# Patient Record
Sex: Female | Born: 1970 | Race: Black or African American | Hispanic: No | Marital: Single | Smoking: Never smoker
Health system: Southern US, Community
[De-identification: ages and names within clinical notes are randomized; demographics above are authoritative.]

## PROBLEM LIST (undated history)

## (undated) DIAGNOSIS — I471 Supraventricular tachycardia, unspecified: Secondary | ICD-10-CM

## (undated) DIAGNOSIS — R011 Cardiac murmur, unspecified: Secondary | ICD-10-CM

## (undated) DIAGNOSIS — R000 Tachycardia, unspecified: Secondary | ICD-10-CM

## (undated) DIAGNOSIS — I82409 Acute embolism and thrombosis of unspecified deep veins of unspecified lower extremity: Secondary | ICD-10-CM

## (undated) DIAGNOSIS — I2699 Other pulmonary embolism without acute cor pulmonale: Secondary | ICD-10-CM

## (undated) DIAGNOSIS — C50911 Malignant neoplasm of unspecified site of right female breast: Secondary | ICD-10-CM

## (undated) HISTORY — PX: MOUTH SURGERY: SHX715

---

## 2001-11-25 ENCOUNTER — Other Ambulatory Visit: Admission: RE | Admit: 2001-11-25 | Discharge: 2001-11-25 | Payer: Self-pay | Admitting: Obstetrics and Gynecology

## 2005-10-18 ENCOUNTER — Encounter: Admission: RE | Admit: 2005-10-18 | Discharge: 2005-10-18 | Payer: Self-pay | Admitting: Sports Medicine

## 2006-05-30 ENCOUNTER — Encounter: Admission: RE | Admit: 2006-05-30 | Discharge: 2006-05-30 | Payer: Self-pay | Admitting: Obstetrics and Gynecology

## 2006-06-13 ENCOUNTER — Encounter: Admission: RE | Admit: 2006-06-13 | Discharge: 2006-06-13 | Payer: Self-pay | Admitting: Obstetrics and Gynecology

## 2013-06-04 ENCOUNTER — Other Ambulatory Visit: Payer: Self-pay

## 2013-06-04 DIAGNOSIS — Z1231 Encounter for screening mammogram for malignant neoplasm of breast: Secondary | ICD-10-CM

## 2013-06-04 DIAGNOSIS — Z803 Family history of malignant neoplasm of breast: Secondary | ICD-10-CM

## 2013-06-24 ENCOUNTER — Ambulatory Visit: Payer: Self-pay

## 2013-06-25 ENCOUNTER — Ambulatory Visit
Admission: RE | Admit: 2013-06-25 | Discharge: 2013-06-25 | Disposition: A | Discharge status: Home / Self Care | Payer: 59 | Source: Ambulatory Visit

## 2013-06-25 DIAGNOSIS — Z1231 Encounter for screening mammogram for malignant neoplasm of breast: Secondary | ICD-10-CM

## 2013-06-25 DIAGNOSIS — Z803 Family history of malignant neoplasm of breast: Secondary | ICD-10-CM

## 2013-06-29 ENCOUNTER — Other Ambulatory Visit: Payer: Self-pay | Admitting: Family Medicine

## 2013-06-29 DIAGNOSIS — R928 Other abnormal and inconclusive findings on diagnostic imaging of breast: Secondary | ICD-10-CM

## 2013-07-14 ENCOUNTER — Ambulatory Visit
Admission: RE | Admit: 2013-07-14 | Discharge: 2013-07-14 | Disposition: A | Discharge status: Home / Self Care | Payer: 59 | Source: Ambulatory Visit | Attending: Family Medicine | Admitting: Family Medicine

## 2013-07-14 DIAGNOSIS — R928 Other abnormal and inconclusive findings on diagnostic imaging of breast: Secondary | ICD-10-CM

## 2014-07-07 HISTORY — PX: BREAST BIOPSY: SHX20

## 2014-07-25 ENCOUNTER — Other Ambulatory Visit: Payer: Self-pay | Admitting: Family Medicine

## 2014-07-25 DIAGNOSIS — N63 Unspecified lump in unspecified breast: Secondary | ICD-10-CM

## 2014-08-03 ENCOUNTER — Encounter (INDEPENDENT_AMBULATORY_CARE_PROVIDER_SITE_OTHER): Payer: Self-pay

## 2014-08-03 ENCOUNTER — Ambulatory Visit
Admission: RE | Admit: 2014-08-03 | Discharge: 2014-08-03 | Disposition: A | Discharge status: Home / Self Care | Payer: 59 | Source: Ambulatory Visit | Attending: Family Medicine | Admitting: Family Medicine

## 2014-08-03 ENCOUNTER — Other Ambulatory Visit: Payer: Self-pay | Admitting: Family Medicine

## 2014-08-03 DIAGNOSIS — N63 Unspecified lump in unspecified breast: Secondary | ICD-10-CM

## 2014-08-03 DIAGNOSIS — N6310 Unspecified lump in the right breast, unspecified quadrant: Secondary | ICD-10-CM

## 2014-08-05 ENCOUNTER — Ambulatory Visit
Admission: RE | Admit: 2014-08-05 | Discharge: 2014-08-05 | Disposition: A | Discharge status: Home / Self Care | Payer: 59 | Source: Ambulatory Visit | Attending: Family Medicine | Admitting: Family Medicine

## 2014-08-05 ENCOUNTER — Other Ambulatory Visit: Payer: Self-pay | Admitting: Family Medicine

## 2014-08-05 DIAGNOSIS — N6310 Unspecified lump in the right breast, unspecified quadrant: Secondary | ICD-10-CM

## 2014-08-07 DIAGNOSIS — C50911 Malignant neoplasm of unspecified site of right female breast: Secondary | ICD-10-CM

## 2014-08-07 HISTORY — DX: Malignant neoplasm of unspecified site of right female breast: C50.911

## 2014-08-08 ENCOUNTER — Other Ambulatory Visit: Payer: Self-pay | Admitting: Family Medicine

## 2014-08-08 DIAGNOSIS — C50911 Malignant neoplasm of unspecified site of right female breast: Secondary | ICD-10-CM

## 2014-08-10 ENCOUNTER — Telehealth: Payer: Self-pay | Admitting: *Deleted

## 2014-08-10 DIAGNOSIS — C50411 Malignant neoplasm of upper-outer quadrant of right female breast: Secondary | ICD-10-CM | POA: Insufficient documentation

## 2014-08-10 NOTE — Telephone Encounter (Signed)
Received call back from patient.  Confirmed [facility] for 08/17/14 at 12N .  Instructions and contact information given.

## 2014-08-10 NOTE — Telephone Encounter (Signed)
Left message for a return phone call to schedule patient for [facility] 08/17/14.   Awaiting patient response.

## 2014-08-12 ENCOUNTER — Ambulatory Visit
Admission: RE | Admit: 2014-08-12 | Discharge: 2014-08-12 | Disposition: A | Discharge status: Home / Self Care | Payer: 59 | Source: Ambulatory Visit | Attending: Family Medicine | Admitting: Family Medicine

## 2014-08-12 DIAGNOSIS — C50911 Malignant neoplasm of unspecified site of right female breast: Secondary | ICD-10-CM

## 2014-08-12 MED ORDER — GADOBENATE DIMEGLUMINE 529 MG/ML IV SOLN
19.0000 mL | Freq: Once | INTRAVENOUS | Status: AC | PRN
  Administered 2014-08-12: 19 mL via INTRAVENOUS

## 2014-08-17 ENCOUNTER — Encounter: Payer: Self-pay | Admitting: Physical Therapy

## 2014-08-17 ENCOUNTER — Ambulatory Visit
Admission: RE | Admit: 2014-08-17 | Discharge: 2014-08-17 | Disposition: A | Discharge status: Home / Self Care | Payer: 59 | Source: Ambulatory Visit | Attending: Radiation Oncology | Admitting: Radiation Oncology

## 2014-08-17 ENCOUNTER — Ambulatory Visit (HOSPITAL_BASED_OUTPATIENT_CLINIC_OR_DEPARTMENT_OTHER): Payer: 59 | Admitting: Hematology and Oncology

## 2014-08-17 ENCOUNTER — Encounter: Payer: Self-pay | Admitting: Hematology and Oncology

## 2014-08-17 ENCOUNTER — Encounter (INDEPENDENT_AMBULATORY_CARE_PROVIDER_SITE_OTHER): Payer: Self-pay

## 2014-08-17 ENCOUNTER — Other Ambulatory Visit: Payer: Self-pay | Admitting: *Deleted

## 2014-08-17 ENCOUNTER — Ambulatory Visit (HOSPITAL_BASED_OUTPATIENT_CLINIC_OR_DEPARTMENT_OTHER): Payer: 59

## 2014-08-17 ENCOUNTER — Encounter: Payer: Self-pay | Admitting: Radiation Oncology

## 2014-08-17 ENCOUNTER — Ambulatory Visit: Payer: 59 | Attending: General Surgery | Admitting: Physical Therapy

## 2014-08-17 ENCOUNTER — Encounter: Payer: Self-pay | Admitting: General Practice

## 2014-08-17 ENCOUNTER — Other Ambulatory Visit (HOSPITAL_BASED_OUTPATIENT_CLINIC_OR_DEPARTMENT_OTHER): Payer: 59

## 2014-08-17 VITALS — BP 127/76 | HR 90 | Temp 98.3°F | Resp 20 | Ht 67.25 in | Wt 206.8 lb

## 2014-08-17 DIAGNOSIS — C50411 Malignant neoplasm of upper-outer quadrant of right female breast: Secondary | ICD-10-CM

## 2014-08-17 DIAGNOSIS — Z5189 Encounter for other specified aftercare: Secondary | ICD-10-CM | POA: Diagnosis not present

## 2014-08-17 DIAGNOSIS — Z171 Estrogen receptor negative status [ER-]: Secondary | ICD-10-CM

## 2014-08-17 DIAGNOSIS — C773 Secondary and unspecified malignant neoplasm of axilla and upper limb lymph nodes: Secondary | ICD-10-CM

## 2014-08-17 LAB — COMPREHENSIVE METABOLIC PANEL (CC13)
ALT: 30 U/L (ref 0–55)
AST: 31 U/L (ref 5–34)
Albumin: 3.9 g/dL (ref 3.5–5.0)
Alkaline Phosphatase: 84 U/L (ref 40–150)
Anion Gap: 10 mEq/L (ref 3–11)
BILIRUBIN TOTAL: 0.34 mg/dL (ref 0.20–1.20)
BUN: 8.9 mg/dL (ref 7.0–26.0)
CHLORIDE: 102 meq/L (ref 98–109)
CO2: 26 mEq/L (ref 22–29)
CREATININE: 0.8 mg/dL (ref 0.6–1.1)
Calcium: 10.5 mg/dL — ABNORMAL HIGH (ref 8.4–10.4)
GLUCOSE: 90 mg/dL (ref 70–140)
POTASSIUM: 3.9 meq/L (ref 3.5–5.1)
Sodium: 138 mEq/L (ref 136–145)
Total Protein: 8 g/dL (ref 6.4–8.3)

## 2014-08-17 LAB — CBC WITH DIFFERENTIAL/PLATELET
BASO%: 1 % (ref 0.0–2.0)
Basophils Absolute: 0.1 10*3/uL (ref 0.0–0.1)
EOS%: 0.7 % (ref 0.0–7.0)
Eosinophils Absolute: 0 10*3/uL (ref 0.0–0.5)
HCT: 38.6 % (ref 34.8–46.6)
HGB: 12.2 g/dL (ref 11.6–15.9)
LYMPH#: 1.4 10*3/uL (ref 0.9–3.3)
LYMPH%: 26.8 % (ref 14.0–49.7)
MCH: 26.7 pg (ref 25.1–34.0)
MCHC: 31.7 g/dL (ref 31.5–36.0)
MCV: 84.3 fL (ref 79.5–101.0)
MONO#: 0.5 10*3/uL (ref 0.1–0.9)
MONO%: 9.3 % (ref 0.0–14.0)
NEUT#: 3.4 10*3/uL (ref 1.5–6.5)
NEUT%: 62.2 % (ref 38.4–76.8)
Platelets: 344 10*3/uL (ref 145–400)
RBC: 4.58 10*6/uL (ref 3.70–5.45)
RDW: 14.9 % — AB (ref 11.2–14.5)
WBC: 5.4 10*3/uL (ref 3.9–10.3)

## 2014-08-17 NOTE — Progress Notes (Signed)
Cancer Center CONSULT NOTE  Patient Care Team: Maurice Small, MD as PCP - General (Family Medicine) Sabas Sous, MD as Consulting Physician (Hematology and Oncology) Glenna Fellows, MD as Consulting Physician (General Surgery) Lonie Peak, MD as Attending Physician (Radiation Oncology)  CHIEF COMPLAINTS/PURPOSE OF CONSULTATION:  Newly diagnosed breast cancer  HISTORY OF PRESENTING ILLNESS:  Jamie Edwards 43 y.o. female is here because of recent diagnosis of Right-sided breast cancer. Patient felt a palpable lump in the right breast and then subsequently underwent a breast mammogram that led to an ultrasound which revealed a 4.3 cm abnormality. She underwent an MRI of the breasts that revealed a 7.5 cm extent of the abnormality. 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 She was presented this morning in the multidisciplinary tumor board and she is here today to discuss the treatment plan. She said today accompanied by her fianc and her daughter and her sister.  I reviewed her records extensively and collaborated the history with the patient.  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  She was never exposed to fertility medications or hormone replacement therapy.  She has family history of Breast/GYN/GI cancer Her mother was diagnosed with breast cancer age 61 and she died soon after.  MEDICAL HISTORY:  Past Medical History  Diagnosis Date  . Breast cancer     SURGICAL HISTORY: History reviewed. No pertinent past surgical history.  SOCIAL HISTORY: History   Social History  . Marital Status: Single    Spouse Name: N/A    Number of Children: N/A  . Years of Education: N/A   Occupational History  . Not on file.   Social History Main Topics  . Smoking status: Never Smoker   . Smokeless tobacco: Not on file  . Alcohol Use: No  . Drug Use: No  . Sexual Activity: Not on file   Other Topics Concern  . Not on file   Social History Narrative    FAMILY HISTORY: History reviewed. No pertinent family history.  ALLERGIES:  is allergic to penicillins.  MEDICATIONS:  No current outpatient prescriptions on file.   No current facility-administered medications for this visit.    REVIEW OF SYSTEMS:   Constitutional: Denies fevers, chills or abnormal night sweats Eyes: Denies blurriness of vision, double vision or watery eyes Ears, nose, mouth, throat, and face: Denies mucositis or sore throat Respiratory: Denies cough, dyspnea or wheezes Cardiovascular: Denies palpitation, chest discomfort or lower extremity swelling Gastrointestinal:  Denies nausea, heartburn or change in bowel habits Skin: Denies abnormal skin rashes Lymphatics: Denies new lymphadenopathy or easy bruising Neurological:Denies numbness, tingling or new weaknesses Behavioral/Psych: Mood is stable, no new changes  Breast: palpable lump in the right breast All other systems were reviewed with the patient and are negative.  PHYSICAL EXAMINATION: ECOG PERFORMANCE STATUS: 1 - Symptomatic but completely ambulatory  Filed Vitals:   08/17/14 1311  BP: 127/76  Pulse: 90  Temp: 98.3  F (36.8 C)  Resp: 20   Filed Weights   08/17/14 1311  Weight: 206 lb 12.8 oz (93.804 kg)    GENERAL:alert, no distress and comfortable SKIN: skin color, texture, turgor are normal, no rashes or significant lesions EYES: normal, conjunctiva are pink and non-injected, sclera clear OROPHARYNX:no exudate, no erythema and lips, buccal mucosa, and tongue normal  NECK: supple, thyroid normal size, non-tender, without nodularity LYMPH:  no palpable lymphadenopathy in the cervical, axillary or inguinal LUNGS: clear to auscultation and percussion with normal breathing effort HEART: regular rate & rhythm and no murmurs and no lower extremity edema ABDOMEN:abdomen soft, non-tender and normal bowel sounds Musculoskeletal:no cyanosis of digits and no clubbing  PSYCH: alert & oriented x 3 with fluent speech NEURO: no focal motor/sensory deficits BREAST:right breast lump is measured to be around 6 cm and physical exam. No palpable axillary or supraclavicular lymphadenopathy  LABORATORY DATA:  I have reviewed the data as listed Lab Results  Component Value Date   WBC 5.4 08/17/2014   HGB 12.2 08/17/2014   HCT 38.6 08/17/2014   MCV 84.3 08/17/2014   PLT 344 08/17/2014   Lab Results  Component Value Date   NA 138 08/17/2014   K 3.9 08/17/2014   CO2 26 08/17/2014    RADIOGRAPHIC STUDIES: I have personally reviewed the radiological reports and agreed with the findings in the report. 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 After reviewing the pathology in detail, we proceeded to discuss the different treatment options between surgery, radiation, chemotherapy, antiestrogen therapies.  Recommendation: Neoadjuvant chemotherapy with dose dense Adriamycin and Cytoxan x4 followed by Taxol and carboplatin weekly x12 followed by surgery followed by radiation  Chemotherapy counseling:I have discussed the risks and benefits of chemotherapy including the risks of nausea/ vomiting, risk of infection from low WBC count, fatigue due to chemo or anemia, bruising or bleeding due to low platelets, mouth sores, loss/ change in taste and decreased appetite. Liver and kidney function will be monitored through out chemotherapy as abnormalities in liver and kidney function may be a side effect of treatment. Cardiac dysfunction due to Adriamycin was discussed in detail. I also discussed neuropathy was from Taxol and carboplatin Risk of permanent bone marrow dysfunction and leukemia due to chemo were also discussed.  Plan: In anticipation of neoadjuvant chemotherapy, I would like to request port placement, echocardiogram, chemotherapy class. The complete staging she will need a PET/CT scan.  PREVENT Study: patient was provided information on PREVENT study which as patient's getting Adriamycin chemotherapy to atorvastatin versus placebo. She would also be able to get 2 cardiac MRIs for free for being on the study. Patient will be contacted our clinical trials team to discuss her interest in participation in this trial.  Patient wants  to go to [facility] for next week and we will see her back once she gets these tests and procedures done to start chemotherapy. patient will also need genetic counseling and testing.  All questions were answered. The patient knows to call the clinic with any problems, questions or concerns. I spent 55 minutes counseling the patient face to face. The total time spent in the  appointment was 60 minutes and more than 50% was on counseling.     Rulon Eisenmenger, MD 08/17/2014 4:14 PM

## 2014-08-17 NOTE — Patient Instructions (Signed)
Patient was instructed today in a home exercise program today for post op shoulder range of motion. These included active assist shoulder flexion in sitting, scapular retraction, wall walking with shoulder abduction, and hands behind head external rotation.  She was encouraged to do these twice a day, holding 3 seconds and repeating 5 times when permitted by her physician.      Physical Therapy Information for After Breast Cancer Surgery/Treatment:   Lymphedema is a swelling condition that you may be at risk for in your arm if you have lymph nodes removed from the armpit area.  After a sentinel node biopsy, the risk is approximately 5-9% and is higher after an axillary node dissection.  There is treatment available for this condition and it is not life-threatening.  Contact your physician or physical therapist with concerns.  You may begin the 4 shoulder/posture exercises (see additional sheet) when permitted by your physician (typically a week after surgery).  If you have drains, you may need to wait until those are removed before beginning range of motion exercises.  A general recommendation is to not lift your arms above shoulder height until drains are removed.  These exercises should be done to your tolerance and gently.  This is not a "no pain/no gain" type of recovery so listen to your body and stretch into the range of motion that you can tolerate, stopping if you have pain.  If you are having immediate reconstruction, ask your plastic surgeon about doing exercises as he or she may want you to wait.  We encourage you to attend the free one time ABC (After Breast Cancer) class offered by [facility].  You will learn information related to lymphedema risk, prevention and treatment and additional exercises to regain mobility following surgery.  You can call [phone] for more information.  This is offered the 1st and 3rd Monday of each month.  You only attend the class  one time.  While undergoing any medical procedure or treatment, try to avoid blood pressure being taken or needle sticks from occurring on the arm on the side of cancer.   This recommendation begins after surgery and continues for the rest of your life.  This may help reduce your risk of getting lymphedema (swelling in your arm).  An excellent resource for those seeking information on lymphedema is the National Lymphedema Network's web site. It can be accessed at [place].org  If you notice swelling in your hand, arm or breast at any time following surgery (even if it is many years from now), please contact your doctor or physical therapist to discuss this.  Lymphedema can be treated at any time but it is easier for you if it is treated early on.  If you feel like your shoulder motion is not returning to normal in a reasonable amount of time, please contact your surgeon or physical therapist.  [facility] [place], [place], [place] [phone]; [address], [place], [place] [address] ABC CLASS After Breast Cancer Class  After Breast Cancer Class is a specially designed exercise class to assist you in a safe recover after having breast cancer surgery.  In this class you will learn how to get back to full function whether your drains were just removed or if you had surgery a month ago.  This one-time class is held the 1st and 3rd Monday of every month from 11:00 a.m. until 12:00 noon at the [facility] located at [address]  [place], [place] [address]  This class is FREE and space is limited. For more information or to register for the next available class, call [phone].  Class Goals   Understand specific stretches to improve the flexibility of you chest and shoulder.  Learn ways to safely strengthen your upper body and improve your posture.  Understand the warning signs of infection and why you may be at risk for an arm infection.  Learn about Lymphedema  and prevention.  ** You do not attend this class until after surgery.  Drains must be removed to participate

## 2014-08-17 NOTE — Progress Notes (Signed)
Visited with Jamie Edwards and her family (fiance, niece, close friend, and others) at breast clinic, introducing [facility] team/resources.  Did not receive distress screen from pt, but did review her stressors and support resources, providing pastoral presence, reflective listening, affirmation, and encouragement.  Per pt, she has good support from family/friends/church, appreciates the comprehensive info from clinic, and feels additional stress that her dx is more serious than she originally anticipated.  Family/friends indicated interest in attending events with her for support.  All are aware of ongoing chaplain and [facility] team availability, but please also page as needs arise:  [phone].  Thank you.  [place], [place]

## 2014-08-17 NOTE — Therapy (Signed)
Physical Therapy Evaluation  Patient Details  Name: Jamie Edwards MRN: [id number] Date of Birth: 1971-03-12  Encounter Date: 08/17/2014      PT End of Session - 08/17/14 1749    Visit Number 1   Number of Visits 1   PT Start Time 1408   PT Stop Time 1440   PT Time Calculation (min) 32 min   Activity Tolerance Patient tolerated treatment well   Behavior During Therapy [facility] for tasks assessed/performed      Past Medical History  Diagnosis Date  . Breast cancer     History reviewed. No pertinent past surgical history.  There were no vitals taken for this visit.  Visit Diagnosis:  Cancer of upper-outer quadrant of female breast, right      Subjective Assessment - 08/17/14 1740    Symptoms Patient was seen today at the breast multidisciplinary clinic for an assessment of her new right breast cancer.   Pertinent History She was diagnosed on 08/12/14 (MRI date) with Triple negative, axillary node positive right breast cancer with a Ki67 of 90%.     Patient Stated Goals Education about lymphedema risk reduction and post surgical home exercises.   Currently in Pain? No/denies          [facility] PT Assessment - 08/17/14 0001    Assessment   Medical Diagnosis right Triple negative breast cancer   Onset Date 08/12/14   Precautions   Precautions Other (comment)  active right breast cancer   Balance Screen   Has the patient fallen in the past 6 months No   Has the patient had a decrease in activity level because of a fear of falling?  No   Is the patient reluctant to leave their home because of a fear of falling?  No   Home Environment   Living Enviornment Private residence   Living Arrangements Spouse/significant other;Children  Lives with [place], 55 y.o. daughter, 8 mth old granddaughte   Available Help at Discharge Family   Prior Function   Level of [place] with basic ADLs   Vocation Full time Teacher, English as a foreign language   Vocation Requirements minor lifting, typing, presenting to groups   Leisure She does not exercise   Cognition   Overall Cognitive Status Within Functional Limits for tasks assessed   Posture/Postural Control   Posture/Postural Control Postural limitations   Postural Limitations Rounded Shoulders   AROM   Right Shoulder Extension 37 Degrees   Right Shoulder Flexion 116 Degrees   Right Shoulder ABduction 109 Degrees   Right Shoulder Internal Rotation 68 Degrees   Right Shoulder External Rotation 72 Degrees   Left Shoulder Extension 55 Degrees   Left Shoulder Flexion 134 Degrees   Left Shoulder ABduction 138 Degrees   Left Shoulder Internal Rotation 70 Degrees   Left Shoulder External Rotation 86 Degrees            PT Education - 08/17/14 1749    Education provided Yes   Education Details Post op shoulder range of motion home exercise program   Person(s) Educated Patient   Methods Explanation;Demonstration;Handout   Comprehension Returned demonstration;Verbalized understanding           Plan - 08/17/14 1750    Clinical Impression Statement Patient is planning to undergo neoadjuvant chemotherapy for Triple negative right breast cancer.  She will then likely undergo a right lumpectomy or mastectomy with an axillary node dissection followed by radiation as she has a known positive axillary node.  She has staging scans scheduled and will meet with a genetics counselor.  She will follow up with physical therapy as need throughout chemo and/or after surgery.   Pt will benefit from skilled therapeutic intervention in order to improve on the following deficits Impaired UE functional use;Increased edema;Decreased range of motion;Decreased endurance;Decreased knowledge of precautions;Decreased activity tolerance;Decreased scar mobility;Decreased strength  as needed during chemotherapy and after surgery.   Rehab Potential Excellent   Clinical Impairments Affecting Rehab Potential none    PT Frequency One time visit   PT Treatment/Interventions Therapeutic exercise;Patient/family education   Consulted and Agree with Plan of Care Patient        Problem List Patient Active Problem List   Diagnosis Date Noted  . Breast cancer of upper-outer quadrant of right female breast 08/10/2014         LYMPHEDEMA/ONCOLOGY QUESTIONNAIRE - 08/17/14 1746    Type   Cancer Type Right triple negative breast cancer   Lymphedema Assessments   Lymphedema Assessments Upper extremities   Right Upper Extremity Lymphedema   10 cm Proximal to Olecranon Process 35.9 cm   Olecranon Process 30 cm   10 cm Proximal to Ulnar Styloid Process 24.3 cm   Just Proximal to Ulnar Styloid Process 17.1 cm   Across Hand at [employer] 18.6 cm   At [place] of 2nd Digit 6.3 cm   Left Upper Extremity Lymphedema   10 cm Proximal to Olecranon Process 34.4 cm   Olecranon Process 28.8 cm   10 cm Proximal to Ulnar Styloid Process 23.4 cm   Just Proximal to Ulnar Styloid Process 17.8 cm   Across Hand at [employer] 18.5 cm   At [place] of 2nd Digit 6.2 cm            Breast Clinic Goals - 08/17/14 1754    Patient will be able to verbalize understanding of pertinent lymphedema risk reduction practices relevant to her diagnosis specifically related to skin care.   Time 1   Period Days   Status Achieved   Patient will be able to return demonstrate and/or verbalize understanding of the post-op home exercise program related to regaining shoulder range of motion.   Time 1   Period Days   Status Achieved   Patient will be able to verbalize understanding of the importance of attending the postoperative After Breast Cancer Class for further lymphedema risk reduction education and therapeutic exercise.   Time 1   Period Days   Status Achieved                SMITH,MARTI COOPER, PT   08/17/2014, 5:58 PM

## 2014-08-17 NOTE — Assessment & Plan Note (Signed)
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 After reviewing the pathology in detail, we proceeded to discuss the different treatment options between surgery, radiation, chemotherapy, antiestrogen therapies.  Recommendation: Neoadjuvant chemotherapy with dose dense Adriamycin and Cytoxan x4 followed by Taxol and carboplatin weekly x12 followed by surgery followed by radiation  Chemotherapy counseling:I have discussed the risks and benefits of chemotherapy including the risks of nausea/ vomiting, risk of infection from low WBC count, fatigue due to chemo or anemia, bruising or bleeding due to low platelets, mouth sores, loss/ change in taste and decreased appetite. Liver and kidney function will be monitored through out chemotherapy as abnormalities in liver and kidney function may be a side effect of treatment. Cardiac dysfunction due to Adriamycin was discussed in detail. I also discussed neuropathy was from Taxol and carboplatin Risk of permanent bone marrow dysfunction and leukemia due to chemo were also discussed.  Plan: In anticipation of neoadjuvant chemotherapy, I would like to request port placement, echocardiogram, chemotherapy class. The complete staging she will need a PET/CT scan.  PREVENT Study: patient was provided information on PREVENT study which as patient's getting Adriamycin chemotherapy to atorvastatin versus placebo. She would also be able to get 2 cardiac MRIs for free for being on the study. Patient will be contacted our clinical trials team to discuss her interest in participation in this  trial.  Patient wants to go to [facility] for next week and we will see her back once she gets these tests and procedures done to start chemotherapy.

## 2014-08-17 NOTE — Progress Notes (Signed)
Radiation Oncology         [phone] ________________________________  Initial outpatient Consultation  Name: Jamie Edwards MRN: [id number]  Date: 08/17/2014  DOB: 08-25-1971  WU:[id number] Theda Sers, MD  Excell Seltzer, MD   REFERRING PHYSICIAN: Excell Seltzer, MD  DIAGNOSIS:    ICD-9-CM ICD-10-CM   1. Breast cancer of upper-outer quadrant of right female breast 174.4 C50.411      Stage T3N1Mx, Stage III, Right Breast UOQ Invasive Ductal Carcinoma, ER- / PR- / Her2-, Ki6790%  HISTORY OF PRESENT ILLNESS::Jamie Edwards is a 43 y.o. female who presented with a palpable right breast mass, confirmed on mammogram.  On [place], this was 4.3cm in size. Abnormal axillary nodes appreciated on [place].  MRI - 7.5cm irregular spiculated mass appreciated with right axillary abnormal appearing nodes.  Biopsy of breast mass showed Intermediate to high grade IDC with DCIS;  Right axillary node +.  ER/PR neg, Her2 neg. Ki 67 90%  She denies unintentional weight loss, recurrent HAs, and pain.  PREVIOUS RADIATION THERAPY: No  PAST MEDICAL HISTORY:  has a past medical history of Breast cancer.    PAST SURGICAL HISTORY:History reviewed. No pertinent past surgical history.  FAMILY HISTORY: No cancers reported.  SOCIAL HISTORY:  reports that she has never smoked. She does not have any smokeless tobacco history on file. She reports that she does not drink alcohol or use illicit drugs. 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  ALLERGIES: Penicillins  MEDICATIONS:  No current outpatient prescriptions on file.   No current facility-administered medications for this encounter.    REVIEW OF SYSTEMS:  Notable for that above.   PHYSICAL EXAM:   Vitals with Age-Percentiles 08/17/2014  Length 213.0 cm  Systolic 865  Diastolic 76  Pulse 90  Respiration 20  Weight 93.804 kg  BMI 32.2  VISIT REPORT   General: Alert and oriented, in no acute distress HEENT: Head is  normocephalic.   Extraocular movements are intact. Oropharynx is clear. Neck: Neck is supple, no palpable cervical or supraclavicular lymphadenopathy. Heart: Regular in rate and rhythm with no murmurs, rubs, or gallops. Chest: Clear to auscultation bilaterally, with no rhonchi, wheezes, or rales. Abdomen: Soft, nontender, nondistended, with no rigidity or guarding. Extremities: No cyanosis or edema. Lymphatics: see neck Skin: No concerning lesions. Musculoskeletal: symmetric strength and muscle tone throughout. Neurologic: Cranial nerves II through XII are grossly intact. No obvious focalities. Speech is fluent. Coordination is intact. Psychiatric: Judgment and insight are intact. Affect is appropriate. BREASTS: 4-5cm lesion in UOQ, right breast, appreciated.  No obvious nodes appreciated in axillary regions bilaterally. No left breast lesions appreciated   ECOG = 0  0 - Asymptomatic (Fully active, able to carry on all predisease activities without restriction)  1 - Symptomatic but completely ambulatory (Restricted in physically strenuous activity but ambulatory and able to carry out work of a light or sedentary nature. For example, light housework, office work)  2 - Symptomatic, <50% in bed during the day (Ambulatory and capable of all self care but unable to carry out any work activities. Up and about more than 50% of waking hours)  3 - Symptomatic, >50% in bed, but not bedbound (Capable of only limited self-care, confined to bed or chair 50% or more of waking hours)  4 - Bedbound (Completely disabled. Cannot carry on any self-care. Totally confined to bed or chair)  5 - Death   Eustace Pen MM, Creech RH, Tormey DC, et al. [phone]). "Toxicity and response criteria  of the [facility]". [place] Oncol. 5 (6): 649-55   LABORATORY DATA:  Lab Results  Component Value Date   WBC 5.4 08/17/2014   HGB 12.2 08/17/2014   HCT 38.6 08/17/2014   MCV 84.3 08/17/2014   PLT  344 08/17/2014   CMP     Component Value Date/Time   NA 138 08/17/2014 1226   K 3.9 08/17/2014 1226   CO2 26 08/17/2014 1226   GLUCOSE 90 08/17/2014 1226   BUN 8.9 08/17/2014 1226   CREATININE 0.8 08/17/2014 1226   CALCIUM 10.5* 08/17/2014 1226   PROT 8.0 08/17/2014 1226   ALBUMIN 3.9 08/17/2014 1226   AST 31 08/17/2014 1226   ALT 30 08/17/2014 1226   ALKPHOS 84 08/17/2014 1226   BILITOT 0.34 08/17/2014 1226         RADIOGRAPHY: Mr Breast Bilateral W Wo Contrast  08/12/2014   CLINICAL DATA:  43 year-old female with newly diagnosed high-grade invasive ductal carcinoma and metastatic mammary carcinoma to a right axillary lymph node.  LABS:  None obtained today.  EXAM: BILATERAL BREAST MRI WITH AND WITHOUT CONTRAST  TECHNIQUE: Multiplanar, multisequence MR images of both breasts were obtained prior to and following the intravenous administration of 72ml of MultiHance.  THREE-DIMENSIONAL MR IMAGE RENDERING ON INDEPENDENT WORKSTATION:  Three-dimensional MR images were rendered by post-processing of the original MR data on an independent workstation. The three-dimensional MR images were interpreted, and findings are reported in the following complete MRI report for this study. Three dimensional images were evaluated at the independent DynaCad workstation  COMPARISON:  Previous exams  FINDINGS: Breast composition: c:  Heterogeneous fibroglandular tissue  Background parenchymal enhancement: Moderate  Right breast: A 4.5 x 7.5 x 4 cm irregular enhancing mass with rapid washout kinetics is present within the upper outer right breast (primarily posterior third) and contains biopsy clip artifact -compatible with biopsy-proven malignancy.  No other suspicious areas of enhancement are identified.  Left breast: No mass or abnormal enhancement.  Lymph nodes: Enlarged level 1 right axillary lymph nodes are identified, 1 which is compatible with biopsy-proven metastatic mammary carcinoma. Upper limits of normal  sized level 2 right axillary lymph nodes are identified.  Ancillary findings:  A small to moderate hiatal hernia is noted.  IMPRESSION: 4.5 x 7.5 x 4 cm biopsy-proven upper-outer right breast malignancy with biopsy-proven level 1 right axillary lymph node metastasis. Upper limits of normal size level 2 right axillary lymph nodes identified.  No evidence of multifocal, multicentric or contralateral malignancy.  RECOMMENDATION: Treatment plan  BI-RADS CATEGORY  6: Known biopsy-proven malignancy.   Electronically Signed   By: Hassan Rowan M.D.   On: 08/12/2014 15:48   Mm Digital Diagnostic Bilat  08/03/2014   CLINICAL DATA:  43 year old female with palpable mass in the upper-outer right breast discovered on self-examination. Mother with history of breast cancer in her 60s.  EXAM: DIGITAL DIAGNOSTIC  BILATERAL MAMMOGRAM WITH CAD  ULTRASOUND RIGHT BREAST  COMPARISON:  06/25/2013  ACR Breast Density Category b: There are scattered areas of fibroglandular density.  FINDINGS: A spot compression view of the right breast and routine views of both breasts demonstrate a new indistinct mass within the upper-outer right breast.  No suspicious calcifications or distortion noted.  No suspicious findings are noted within the left breast.  Mammographic images were processed with CAD.  On physical exam, a firm palpable mass at the at the 10 o'clock position of the right breast 8 cm from the  nipple is identified.  Ultrasound is performed, showing a 4.3 x 1.7 x 2.9 cm irregular hypoechoic mass at the 10 o'clock position of the right breast 8 cm from the nipple.  At least 3 enlarged right axillary lymph nodes are identified.  IMPRESSION: 4.3 x 1.7 x 2.9 cm highly suspicious mass in the upper-outer right breast with enlarged right axillary lymph nodes. This likely represents right breast malignancy with lymph node metastases. Tissue sampling is recommended.  No mammographic evidence of left breast malignancy.  RECOMMENDATION:  Ultrasound-guided right breast and right axillary biopsies. The patient could not stay today for these procedures, which have been scheduled for 08/05/2014.  I have discussed the findings and recommendations with the patient. Results were also provided in writing at the conclusion of the visit. If applicable, a reminder letter will be sent to the patient regarding the next appointment.  BI-RADS CATEGORY  5: Highly suggestive of malignancy.   Electronically Signed   By: Hassan Rowan M.D.   On: 08/03/2014 16:21   Mm Digital Diagnostic Unilat R  08/05/2014   CLINICAL DATA:  43 year old female for evaluation of clip placement following ultrasound-guided right breast biopsy.  EXAM: DIAGNOSTIC RIGHT MAMMOGRAM POST ULTRASOUND BIOPSY  COMPARISON:  Previous exams  FINDINGS: Mammographic images were obtained following ultrasound guided biopsy of the 4.3 cm irregular mass at the 10 o'clock position of the right breast.  The coil shaped clip is in satisfactory position.  IMPRESSION: Satisfactory clip position following ultrasound-guided right breast biopsy.  Final Assessment: Post Procedure Mammograms for Marker Placement   Electronically Signed   By: Hassan Rowan M.D.   On: 08/05/2014 12:10   US Breast Ltd Uni Right Inc Axilla  08/03/2014   CLINICAL DATA:  43 year old female with palpable mass in the upper-outer right breast discovered on self-examination. Mother with history of breast cancer in her 61s.  EXAM: DIGITAL DIAGNOSTIC  BILATERAL MAMMOGRAM WITH CAD  ULTRASOUND RIGHT BREAST  COMPARISON:  06/25/2013  ACR Breast Density Category b: There are scattered areas of fibroglandular density.  FINDINGS: A spot compression view of the right breast and routine views of both breasts demonstrate a new indistinct mass within the upper-outer right breast.  No suspicious calcifications or distortion noted.  No suspicious findings are noted within the left breast.  Mammographic images were processed with CAD.  On physical exam, a firm  palpable mass at the at the 10 o'clock position of the right breast 8 cm from the nipple is identified.  Ultrasound is performed, showing a 4.3 x 1.7 x 2.9 cm irregular hypoechoic mass at the 10 o'clock position of the right breast 8 cm from the nipple.  At least 3 enlarged right axillary lymph nodes are identified.  IMPRESSION: 4.3 x 1.7 x 2.9 cm highly suspicious mass in the upper-outer right breast with enlarged right axillary lymph nodes. This likely represents right breast malignancy with lymph node metastases. Tissue sampling is recommended.  No mammographic evidence of left breast malignancy.  RECOMMENDATION: Ultrasound-guided right breast and right axillary biopsies. The patient could not stay today for these procedures, which have been scheduled for 08/05/2014.  I have discussed the findings and recommendations with the patient. Results were also provided in writing at the conclusion of the visit. If applicable, a reminder letter will be sent to the patient regarding the next appointment.  BI-RADS CATEGORY  5: Highly suggestive of malignancy.   Electronically Signed   By: Hassan Rowan M.D.   On: 08/03/2014 16:21  [place] Rt Breast Bx W Loc Dev 1st Lesion Img Bx Spec US Guide  08/08/2014   ADDENDUM REPORT: 08/08/2014 12:13  ADDENDUM: Final pathology demonstrates:  RIGHT BREAST:  HIGH-GRADE INVASIVE DUCTAL CARCINOMA  RIGHT AXILLA:  METASTATIC MAMMARY CARCINOMA  Histology correlates with imaging findings.  The patient was contacted by phone on 08/08/2014 and these results given to her which she understood. Her questions were answered.  The patient had no complaints with her biopsy site.  The patient was encouraged to return to [facility] and obtain breast cancer educational material.  Recommend surgery/oncology consultation. An appointment at the multidisciplinary Clinic has been scheduled for 08/17/2014 and the patient informed.  Recommend bilateral breast MRI which has been scheduled for 08/12/2014 and the  patient informed.   Electronically Signed   By: Hassan Rowan M.D.   On: 08/08/2014 12:13   08/08/2014   CLINICAL DATA:  43 year old female with suspicious mass in the upper-outer right breast and enlarged right axillary lymph nodes. For tissue sampling of 4.3 cm irregular mass at the 10 o'clock position of the right breast.  EXAM: ULTRASOUND GUIDED RIGHT BREAST CORE NEEDLE BIOPSY  COMPARISON:  Previous exams.  FINDINGS: I met with the patient and we discussed the procedure of ultrasound-guided biopsy, including benefits and alternatives. We discussed the high likelihood of a successful procedure. We discussed the risks of the procedure, including infection, bleeding, tissue injury, clip migration, and inadequate sampling. Informed written consent was given. The usual time-out protocol was performed immediately prior to the procedure.  Using sterile technique and 2% Lidocaine as local anesthetic, under direct ultrasound visualization, a 14 gauge spring-loaded device was used to perform biopsy of the 4.3 cm irregular hypoechoic mass at the 10 o'clock position of the right breast using a lateral approach. At the conclusion of the procedure a coil shaped tissue marker clip was deployed into the biopsy cavity. Follow up 2 view mammogram was performed and dictated separately.  IMPRESSION: Ultrasound guided biopsy of right breast mass at the 10 o'clock position. No apparent complications.  Pathology will be followed.  Electronically Signed: By: Hassan Rowan M.D. On: 08/05/2014 12:08   [place] Rt Breast Bx W Loc Dev Ea Add Lesion Img Bx Spec US Guide  08/08/2014   ADDENDUM REPORT: 08/08/2014 12:14  ADDENDUM: Final pathology demonstrates:  RIGHT BREAST:  HIGH-GRADE INVASIVE DUCTAL CARCINOMA  RIGHT AXILLA:  METASTATIC MAMMARY CARCINOMA  Histology correlates with imaging findings.  The patient was contacted by phone on 08/08/2014 and these results given to her which she understood. Her questions were answered.  The patient had no  complaints with her biopsy site.  The patient was encouraged to return to [facility] and obtain breast cancer educational material.  Recommend surgery/oncology consultation. An appointment at the multidisciplinary Clinic has been scheduled for 08/17/2014 and the patient an form.  Recommend bilateral breast MRI which has been scheduled for 08/12/2014 and the patient informed.   Electronically Signed   By: Hassan Rowan M.D.   On: 08/08/2014 12:14   08/08/2014   CLINICAL DATA:  43 year old female with suspicious right breast mass and enlarged right axillary lymph nodes. For tissue sampling of 1 of the enlarged right axillary lymph nodes.  EXAM: ULTRASOUND GUIDED CORE NEEDLE BIOPSY OF A RIGHT AXILLARY NODE  COMPARISON:  Previous exams.  FINDINGS: I met with the patient and we discussed the procedure of ultrasound-guided biopsy, including benefits and alternatives. We discussed the high likelihood of a successful procedure. We  discussed the risks of the procedure, including infection, bleeding, tissue injury, clip migration, and inadequate sampling. Informed written consent was given. The usual time-out protocol was performed immediately prior to the procedure.  Using sterile technique and 2% Lidocaine as local anesthetic, under direct ultrasound visualization, a 16 gauge spring-loaded device was used to perform biopsy of 1 of the enlarged right axillary lymph nodes using a lateral approach.  IMPRESSION: Ultrasound guided biopsy of enlarged right axillary lymph node. No apparent complications.  Pathology will be followed.  Electronically Signed: By: Hassan Rowan M.D. On: 08/05/2014 12:09      IMPRESSION/PLAN:Today, I talked to the patient about the findings and work-up thus far. We discussed the patient's diagnosis of right breast cancer and general treatment for this, highlighting the role of radiotherapy in the management. We discussed the available radiation techniques, and focused on the details of logistics and  delivery.    Genetics referral will be made.  PET scan for staging.  Neoadjuvant chemotherapy is planned for systemic coverage; also, to shrink her mass and allow breast conservation, hopefully.  Following surgery, she will need radiotherapy to maximize locoregional control.  It was a pleasure meeting the patient today.  The patient is enthusiastic about proceeding with treatment. I look forward to participating in the patient's care. __________________________________________   Eppie Gibson, MD

## 2014-08-18 ENCOUNTER — Telehealth: Payer: Self-pay | Admitting: *Deleted

## 2014-08-18 ENCOUNTER — Other Ambulatory Visit: Payer: Self-pay | Admitting: *Deleted

## 2014-08-18 ENCOUNTER — Telehealth: Payer: Self-pay | Admitting: Hematology and Oncology

## 2014-08-18 DIAGNOSIS — C50411 Malignant neoplasm of upper-outer quadrant of right female breast: Secondary | ICD-10-CM

## 2014-08-18 NOTE — Telephone Encounter (Signed)
Spoke to pt concerning [place] from 08/17/14. Pt denies questions or concerns regarding dx or treatment care plan. Pt is concerned about port placement. She is scheduled to start chemo on 12/3. Dr. Excell Seltzer would like to place port on 12/1. She does have some plans that appear unlikely to be able to move to a different day. Due to Dr. Lear Ng and the patients out of town status over the next 2 weeks, I have asked Dr. Excell Seltzer his thoughts for having IR place her port. I have discussed this possibility with the patient. Informed pt that as soon as we hear back from Dr. Excell Seltzer we will inform her of whether IR can place her port. Received verbal understanding. Pt denies further needs. Encourage pt to call with any concerning matters. Contact information given.

## 2014-08-18 NOTE — Telephone Encounter (Signed)
Per staff message and POF I have scheduled appts. Advised scheduler of appts. JMW  

## 2014-08-18 NOTE — Progress Notes (Signed)
Note created by Dr. Gudena during office visit. Copy to patient, original to scan. 

## 2014-08-18 NOTE — Telephone Encounter (Signed)
, °

## 2014-08-19 ENCOUNTER — Telehealth: Payer: Self-pay | Admitting: Hematology and Oncology

## 2014-08-19 NOTE — Telephone Encounter (Signed)
, °

## 2014-08-22 ENCOUNTER — Telehealth: Payer: Self-pay | Admitting: Adult Health

## 2014-08-22 ENCOUNTER — Other Ambulatory Visit: Payer: Self-pay | Admitting: *Deleted

## 2014-08-22 DIAGNOSIS — C50411 Malignant neoplasm of upper-outer quadrant of right female breast: Secondary | ICD-10-CM

## 2014-08-22 NOTE — Telephone Encounter (Signed)
Encounter opened in error

## 2014-08-25 ENCOUNTER — Other Ambulatory Visit: Payer: 59

## 2014-08-25 ENCOUNTER — Encounter (HOSPITAL_BASED_OUTPATIENT_CLINIC_OR_DEPARTMENT_OTHER): Admission: RE | Payer: Self-pay | Source: Ambulatory Visit

## 2014-08-25 ENCOUNTER — Ambulatory Visit (HOSPITAL_BASED_OUTPATIENT_CLINIC_OR_DEPARTMENT_OTHER): Admission: RE | Admit: 2014-08-25 | Payer: 59 | Source: Ambulatory Visit | Admitting: General Surgery

## 2014-08-25 SURGERY — INSERTION, TUNNELED CENTRAL VENOUS DEVICE, WITH PORT
Anesthesia: General

## 2014-08-26 ENCOUNTER — Encounter (HOSPITAL_BASED_OUTPATIENT_CLINIC_OR_DEPARTMENT_OTHER): Payer: Self-pay | Admitting: *Deleted

## 2014-08-26 NOTE — Progress Notes (Signed)
Labs done 08/17/14 No cardiac or resp problems

## 2014-08-29 ENCOUNTER — Other Ambulatory Visit: Payer: 59

## 2014-08-29 ENCOUNTER — Ambulatory Visit (HOSPITAL_BASED_OUTPATIENT_CLINIC_OR_DEPARTMENT_OTHER): Payer: 59 | Admitting: Genetic Counselor

## 2014-08-29 ENCOUNTER — Encounter: Payer: 59 | Admitting: Genetic Counselor

## 2014-08-29 ENCOUNTER — Encounter: Payer: Self-pay | Admitting: Genetic Counselor

## 2014-08-29 ENCOUNTER — Ambulatory Visit (HOSPITAL_COMMUNITY)
Admission: RE | Admit: 2014-08-29 | Discharge: 2014-08-29 | Disposition: A | Discharge status: Home / Self Care | Payer: 59 | Source: Ambulatory Visit | Attending: Hematology and Oncology | Admitting: Hematology and Oncology

## 2014-08-29 DIAGNOSIS — C773 Secondary and unspecified malignant neoplasm of axilla and upper limb lymph nodes: Secondary | ICD-10-CM

## 2014-08-29 DIAGNOSIS — Z01818 Encounter for other preprocedural examination: Secondary | ICD-10-CM | POA: Insufficient documentation

## 2014-08-29 DIAGNOSIS — C50411 Malignant neoplasm of upper-outer quadrant of right female breast: Secondary | ICD-10-CM

## 2014-08-29 DIAGNOSIS — Z803 Family history of malignant neoplasm of breast: Secondary | ICD-10-CM

## 2014-08-29 DIAGNOSIS — Z5111 Encounter for antineoplastic chemotherapy: Secondary | ICD-10-CM

## 2014-08-29 DIAGNOSIS — Z315 Encounter for genetic counseling: Secondary | ICD-10-CM

## 2014-08-29 NOTE — Progress Notes (Deleted)
Dr.  Laurann Montana, Margaretha Sheffield, MD requested a consultation for genetic counseling and risk assessment for Jamie Edwards, a 43 y.o. female, for discussion of her personal and family history of breast cancer.  She presents to clinic today to discuss the possibility of a genetic predisposition to cancer, and to further clarify her risks, as well as her family members' risks for cancer.   HISTORY OF PRESENT ILLNESS: In 2015, at the age of 28, Jamie Edwards was diagnosed with invasive ductal carcinoma of the right breast.  The tumor is triple negative. This is currently being treated with chemotherapy.  Surgery will be scheduled in about 5 months.  She knows that they anticipate that she will have radiation as well.  Nobody in her family has had genetic testing in the past.     Past Medical History  Diagnosis Date  . Breast cancer   . Medical history non-contributory   . Anxiety     Past Surgical History  Procedure Laterality Date  . Mouth surgery      implants    History   Social History  . Marital Status: Single    Spouse Name: N/A    Number of Children: N/A  . Years of Education: N/A   Social History Main Topics  . Smoking status: Never Smoker   . Smokeless tobacco: Not on file  . Alcohol Use: No  . Drug Use: No  . Sexual Activity: Not on file   Other Topics Concern  . Not on file   Social History Narrative    REPRODUCTIVE HISTORY AND PERSONAL RISK ASSESSMENT FACTORS: Menarche was at age 34.   premenopausal Uterus Intact: yes Ovaries Intact: yes G1P1A0, first live birth at age 23  She has not previously undergone treatment for infertility.   Oral Contraceptive use: 2 years   She has not used HRT in the past.    FAMILY HISTORY:  We obtained a detailed, 4-generation family history.  Significant diagnoses are listed below: No family history on file.  Patient's maternal ancestors are of [place], Caucasian and American [place] descent, and paternal  ancestors are of African American descent. There is no reported Ashkenazi Jewish ancestry. There is no known consanguinity.  GENETIC COUNSELING ASSESSMENT: Jamie Edwards is a 43 y.o. female with a personal and family history of premenopausal breast cancer which somewhat suggestive of a hereditary breast cancer and predisposition to cancer. We, therefore, discussed and recommended the following at today's visit.   DISCUSSION: We reviewed the characteristics, features and inheritance patterns of hereditary cancer syndromes. We also discussed genetic testing, including the appropriate family members to test, the process of testing, insurance coverage and turn-around-time for results. We reviewed that about 5-10% of women with breast cancer have a hereditary breast cancer syndrome.  A discussion about the different hereditary cancer syndromes, along with which genes are associated with triple negative breast cancer was reviewed.    Per [place], her mother was diagnosed and died within 51 months of diagnosis of breast cancer, and therefore did not have genetic testing.  Jamie Edwards is  ***In order to estimate her chance of having a {CA GENE:62345} mutation, we used statistical models ({GENMODELS:62370}) and laboratory data that take into account her personal medical history, family history and ancestry.  Because each model is different, there can be a lot of variability in the risks they give.  Therefore, these numbers must be considered a rough range and not a precise risk of having a {  CA SNKN:39767} mutation.  These models estimate that she has approximately a ***-***% chance of having a mutation. Based on this assessment of her family and personal history, genetic testing {IS/ISNOT:34056} recommended.  ***Based on the patient's personal and family history, statistical models ({GENMODELS:62370})  and literature data were used to estimate her risk of developing {CA HX:54794}. These estimate her lifetime  risk of developing {CA HX:54794} to be approximately ***% to ***%. This estimation does not take into account any genetic testing results.  The patient's lifetime breast cancer risk is a preliminary estimate based on available information using one of several models endorsed by the [place] (ACS). The ACS recommends consideration of breast MRI screening as an adjunct to mammography for patients at high risk (defined as 20% or greater lifetime risk). A more detailed breast cancer risk assessment can be considered, if clinically indicated.   PLAN: After considering the risks, benefits, and limitations, Jamie Edwards provided informed consent to pursue genetic testing and the blood sample {WILL/WILL NOT} be sent to {Lab} Laboratories for analysis of the {test}. We discussed the implications of a positive, negative and/ or variant of uncertain significance genetic test result. Results should be available within approximately {TAT TIME} weeks' time, at which point they will be disclosed by telephone to [facility], as will any additional recommendations warranted by these results. Jamie Edwards will receive a summary of her genetic counseling visit and a copy of her results once available. This information will also be available in Epic. We encouraged Jamie Edwards to remain in contact with cancer genetics annually so that we can continuously update the family history and inform her of any changes in cancer genetics and testing that may be of benefit for her family. Jamie Edwards's questions were answered to her satisfaction today. Our contact information was provided should additional questions or concerns arise.  The patient was seen for a total of *** minutes, greater than 50% of which was spent face-to-face counseling.  This note will also be sent to the referring provider via the electronic medical record. The patient will be supplied with a summary of this genetic  counseling discussion as well as educational information on the discussed hereditary cancer syndromes following the conclusion of their visit.     _______________________________________________________________________ For Office Staff:  Number of people involved in session: *** Was an Intern/ student involved with case: {YES/NO:63}

## 2014-08-29 NOTE — Progress Notes (Signed)
  Echocardiogram 2D Echocardiogram has been performed.  Joelene Millin 08/29/2014, 12:00 PM

## 2014-08-29 NOTE — Progress Notes (Addendum)
Dr.  Laurann Edwards, Jamie Sheffield, MD requested a consultation for genetic counseling and risk assessment for Jamie Edwards, a 43 y.o. female, for discussion of her personal and family history of breast cancer.  She presents to clinic today to discuss the possibility of a genetic predisposition to cancer, and to further clarify her risks, as well as her family members' risks for cancer.   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 This is currently being treated with chemotherapy.  Surgery will be scheduled in about 5 months.  She knows that they anticipate that she will have radiation as well.  Nobody in her family has had genetic testing in the past.     Past Medical History  Diagnosis Date  . Breast cancer   . Medical history non-contributory   . Anxiety     Past Surgical History  Procedure Laterality Date  . Mouth surgery      implants    History   Social History  . Marital Status: Single    Spouse Name: N/A    Number of Children: N/A  . Years of Education: N/A   Social History Main Topics  . Smoking status: Never Smoker   . Smokeless tobacco: Not on file  . Alcohol Use: No  . Drug Use: No  . Sexual Activity: Not on file   Other Topics Concern  . Not on file   Social History Narrative   ONCOLOGY HISTORY:   Breast cancer of upper-outer quadrant of right female breast   08/05/2014 Initial Diagnosis Invasive ductal carcinoma, 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, grade 3 ER 0%, PR 0%, HER-2 negative, Ki-67 90%: Right axillary lymph node positive for breast cancer   08/12/2014 Breast MRI 4.5x 7.5 x4 cm biopsy-proven right breast upper outer quadrant cancer with biopsy-proven level I right axillary lymph node with 2 level II right axillary lymph nodes identified    REPRODUCTIVE HISTORY AND PERSONAL RISK ASSESSMENT  FACTORS: Menarche was at age 47.   premenopausal Uterus Intact: yes Ovaries Intact: yes G1P1A0, first live birth at age 65  She has not previously undergone treatment for infertility.   Oral Contraceptive use: 2 years   She has not used HRT in the past.    FAMILY HISTORY:  We obtained a detailed, 4-generation family history.  Significant diagnoses are listed below: Family History  Problem Relation Age of Onset  . Breast cancer Mother 70  . Prostate cancer Father 91  . Esophageal cancer Paternal Uncle     smoker  . Breast cancer Cousin     Patient's maternal ancestors are of [place], Caucasian and American [place] descent, and paternal ancestors are of African American descent. There is no reported Ashkenazi Jewish ancestry. There is no known consanguinity.  GENETIC COUNSELING ASSESSMENT: Jamie Edwards is a 43 y.o. female with a personal and family history of premenopausal breast cancer which somewhat suggestive of a hereditary cancer syndrome and predisposition to cancer. We, therefore, discussed and recommended the following at today's visit.   DISCUSSION: We reviewed the characteristics, features and inheritance patterns of hereditary cancer syndromes. We also discussed genetic testing, including the appropriate family members to test, the process of testing, insurance coverage and turn-around-time for results. We reviewed that about 5-10% of women with breast cancer have a hereditary breast cancer syndrome. A discussion about the  different hereditary cancer syndromes, along with which genes are associated with triple negative breast cancer was reviewed.   Per [place], her mother was diagnosed and died within 57 months of diagnosis of breast cancer, and therefore did not have genetic testing. [name] is worried about her sister, daughter and granddaughter, and therefore wants to pursue genetic testing.  She will wait until September 07, 2014 to have her blood  drawn.  PLAN: After considering the risks, benefits, and limitations, Jamie Edwards provided informed consent to pursue genetic testing and the blood sample will be sent to [facility] for analysis of the [employer]. We discussed the implications of a positive, negative and/ or variant of uncertain significance genetic test result. Results should be available within approximately 2-3 weeks' time, at which point they will be disclosed by telephone to [facility], as will any additional recommendations warranted by these results. Jamie Edwards will receive a summary of her genetic counseling visit and a copy of her results once available. This information will also be available in Epic. We encouraged Jamie Edwards to remain in contact with cancer genetics annually so that we can continuously update the family history and inform her of any changes in cancer genetics and testing that may be of benefit for her family. Jamie Edwards's questions were answered to her satisfaction today. Our contact information was provided should additional questions or concerns arise.  The patient was seen for a total of 60 minutes, greater than 50% of which was spent face-to-face counseling.  This note will also be sent to the referring provider via the electronic medical record. The patient will be supplied with a summary of this genetic counseling discussion as well as educational information on the discussed hereditary cancer syndromes following the conclusion of their visit.     _______________________________________________________________________ For Office Staff:  Number of people involved in session: 2 Was an Intern/ student involved with case: no

## 2014-08-30 ENCOUNTER — Other Ambulatory Visit: Payer: Self-pay | Admitting: Hematology and Oncology

## 2014-08-30 ENCOUNTER — Encounter: Payer: Self-pay | Admitting: Hematology and Oncology

## 2014-08-30 NOTE — Progress Notes (Signed)
Per Bethena Roys they are going to send to [place] to review for auth for jcodes [id number] ([id number])--she had to cancel prev one for [phone] and [id number] and all put on one since the 9045 and [id number] were added. I can call for status in 24hrs.

## 2014-08-31 ENCOUNTER — Ambulatory Visit (HOSPITAL_BASED_OUTPATIENT_CLINIC_OR_DEPARTMENT_OTHER): Payer: 59 | Admitting: Certified Registered"

## 2014-08-31 ENCOUNTER — Other Ambulatory Visit: Payer: Self-pay

## 2014-08-31 ENCOUNTER — Ambulatory Visit (HOSPITAL_COMMUNITY): Payer: 59

## 2014-08-31 ENCOUNTER — Ambulatory Visit (HOSPITAL_BASED_OUTPATIENT_CLINIC_OR_DEPARTMENT_OTHER)
Admission: RE | Admit: 2014-08-31 | Discharge: 2014-08-31 | Disposition: A | Discharge status: Home / Self Care | Payer: 59 | Source: Ambulatory Visit | Attending: Surgery | Admitting: Surgery

## 2014-08-31 ENCOUNTER — Encounter (HOSPITAL_BASED_OUTPATIENT_CLINIC_OR_DEPARTMENT_OTHER)
Admission: RE | Disposition: A | Discharge status: Home / Self Care | Payer: Self-pay | Source: Ambulatory Visit | Attending: Surgery

## 2014-08-31 ENCOUNTER — Encounter (HOSPITAL_BASED_OUTPATIENT_CLINIC_OR_DEPARTMENT_OTHER): Payer: Self-pay | Admitting: Certified Registered"

## 2014-08-31 DIAGNOSIS — Z95828 Presence of other vascular implants and grafts: Secondary | ICD-10-CM

## 2014-08-31 DIAGNOSIS — Z419 Encounter for procedure for purposes other than remedying health state, unspecified: Secondary | ICD-10-CM

## 2014-08-31 DIAGNOSIS — C50919 Malignant neoplasm of unspecified site of unspecified female breast: Secondary | ICD-10-CM | POA: Diagnosis present

## 2014-08-31 DIAGNOSIS — F419 Anxiety disorder, unspecified: Secondary | ICD-10-CM | POA: Insufficient documentation

## 2014-08-31 DIAGNOSIS — C50411 Malignant neoplasm of upper-outer quadrant of right female breast: Secondary | ICD-10-CM

## 2014-08-31 HISTORY — PX: PORTACATH PLACEMENT: SHX2246

## 2014-08-31 SURGERY — INSERTION, TUNNELED CENTRAL VENOUS DEVICE, WITH PORT
Anesthesia: General | Site: Neck | Laterality: Right

## 2014-08-31 MED ORDER — LIDOCAINE HCL (CARDIAC) 20 MG/ML IV SOLN
INTRAVENOUS | Status: DC | PRN
  Administered 2014-08-31: 60 mg via INTRAVENOUS

## 2014-08-31 MED ORDER — PROCHLORPERAZINE MALEATE 10 MG PO TABS: 10.0000 mg | ORAL_TABLET | Freq: Four times a day (QID) | ORAL | Status: DC | PRN

## 2014-08-31 MED ORDER — LORAZEPAM 0.5 MG PO TABS: 0.5000 mg | ORAL_TABLET | Freq: Four times a day (QID) | ORAL | Status: DC | PRN

## 2014-08-31 MED ORDER — CLINDAMYCIN PHOSPHATE 300 MG/50ML IV SOLN: 300.0000 mg | Freq: Once | INTRAVENOUS | Status: DC

## 2014-08-31 MED ORDER — DEXAMETHASONE 4 MG PO TABS: ORAL_TABLET | ORAL | Status: DC

## 2014-08-31 MED ORDER — MIDAZOLAM HCL 2 MG/2ML IJ SOLN: 1.0000 mg | INTRAMUSCULAR | Status: DC | PRN

## 2014-08-31 MED ORDER — HEPARIN SOD (PORK) LOCK FLUSH 100 UNIT/ML IV SOLN
INTRAVENOUS | Status: AC
  Filled 2014-08-31: qty 5

## 2014-08-31 MED ORDER — CLINDAMYCIN PHOSPHATE 600 MG/50ML IV SOLN
INTRAVENOUS | Status: DC | PRN
  Administered 2014-08-31: 300 mg via INTRAVENOUS

## 2014-08-31 MED ORDER — LIDOCAINE-PRILOCAINE 2.5-2.5 % EX CREA: TOPICAL_CREAM | CUTANEOUS | Status: DC

## 2014-08-31 MED ORDER — DEXAMETHASONE SODIUM PHOSPHATE 4 MG/ML IJ SOLN
INTRAMUSCULAR | Status: DC | PRN
  Administered 2014-08-31: 10 mg via INTRAVENOUS

## 2014-08-31 MED ORDER — LACTATED RINGERS IV SOLN
INTRAVENOUS | Status: DC
  Administered 2014-08-31 (×2): via INTRAVENOUS

## 2014-08-31 MED ORDER — HEPARIN (PORCINE) IN NACL 2-0.9 UNIT/ML-% IJ SOLN
INTRAMUSCULAR | Status: DC | PRN
  Administered 2014-08-31: 1 via INTRAVENOUS

## 2014-08-31 MED ORDER — OXYCODONE-ACETAMINOPHEN 5-325 MG PO TABS: 1.0000 | ORAL_TABLET | ORAL | Status: DC | PRN

## 2014-08-31 MED ORDER — BUPIVACAINE-EPINEPHRINE 0.25% -1:200000 IJ SOLN
INTRAMUSCULAR | Status: DC | PRN
  Administered 2014-08-31: 10 mL

## 2014-08-31 MED ORDER — HEPARIN SOD (PORK) LOCK FLUSH 100 UNIT/ML IV SOLN
INTRAVENOUS | Status: DC | PRN
  Administered 2014-08-31: 500 [IU] via INTRAVENOUS

## 2014-08-31 MED ORDER — PROPOFOL 10 MG/ML IV BOLUS
INTRAVENOUS | Status: DC | PRN
  Administered 2014-08-31: 150 mg via INTRAVENOUS

## 2014-08-31 MED ORDER — MIDAZOLAM HCL 5 MG/5ML IJ SOLN
INTRAMUSCULAR | Status: DC | PRN
  Administered 2014-08-31: 2 mg via INTRAVENOUS

## 2014-08-31 MED ORDER — FENTANYL CITRATE 0.05 MG/ML IJ SOLN: 25.0000 ug | INTRAMUSCULAR | Status: DC | PRN

## 2014-08-31 MED ORDER — HEPARIN (PORCINE) IN NACL 2-0.9 UNIT/ML-% IJ SOLN
INTRAMUSCULAR | Status: AC
  Filled 2014-08-31: qty 500

## 2014-08-31 MED ORDER — ONDANSETRON HCL 4 MG/2ML IJ SOLN
INTRAMUSCULAR | Status: DC | PRN
  Administered 2014-08-31: 4 mg via INTRAVENOUS

## 2014-08-31 MED ORDER — FENTANYL CITRATE 0.05 MG/ML IJ SOLN
INTRAMUSCULAR | Status: AC
  Filled 2014-08-31: qty 4

## 2014-08-31 MED ORDER — FENTANYL CITRATE 0.05 MG/ML IJ SOLN
INTRAMUSCULAR | Status: DC | PRN
  Administered 2014-08-31: 50 ug via INTRAVENOUS

## 2014-08-31 MED ORDER — ONDANSETRON HCL 8 MG PO TABS: 8.0000 mg | ORAL_TABLET | Freq: Two times a day (BID) | ORAL | Status: DC | PRN

## 2014-08-31 MED ORDER — CLINDAMYCIN PHOSPHATE 300 MG/50ML IV SOLN
INTRAVENOUS | Status: AC
  Filled 2014-08-31: qty 50

## 2014-08-31 MED ORDER — FENTANYL CITRATE 0.05 MG/ML IJ SOLN: 50.0000 ug | INTRAMUSCULAR | Status: DC | PRN

## 2014-08-31 MED ORDER — MIDAZOLAM HCL 2 MG/2ML IJ SOLN
INTRAMUSCULAR | Status: AC
Start: 2014-08-31 — End: 2014-08-31
  Filled 2014-08-31: qty 2

## 2014-08-31 SURGICAL SUPPLY — 59 items
APL SKNCLS STERI-STRIP NONHPOA (GAUZE/BANDAGES/DRESSINGS) ×1
BAG DECANTER FOR FLEXI CONT (MISCELLANEOUS) ×3 IMPLANT
BENZOIN TINCTURE PRP APPL 2/3 (GAUZE/BANDAGES/DRESSINGS) ×2 IMPLANT
BLADE HEX COATED 2.75 (ELECTRODE) ×3 IMPLANT
BLADE SURG 11 STRL SS (BLADE) ×3 IMPLANT
BLADE SURG 15 STRL LF DISP TIS (BLADE) ×1 IMPLANT
BLADE SURG 15 STRL SS (BLADE) ×3
CANISTER SUCT 1200ML W/VALVE (MISCELLANEOUS) IMPLANT
CHLORAPREP W/TINT 26ML (MISCELLANEOUS) ×3 IMPLANT
CLOSURE WOUND 1/2 X4 (GAUZE/BANDAGES/DRESSINGS) ×1
COVER BACK TABLE 60X90IN (DRAPES) ×3 IMPLANT
COVER MAYO STAND STRL (DRAPES) ×3 IMPLANT
COVER PROBE 5X48 (MISCELLANEOUS) ×3
DECANTER SPIKE VIAL GLASS SM (MISCELLANEOUS) IMPLANT
DRAPE C-ARM 42X72 X-RAY (DRAPES) ×3 IMPLANT
DRAPE LAPAROSCOPIC ABDOMINAL (DRAPES) ×3 IMPLANT
DRAPE UTILITY XL STRL (DRAPES) ×3 IMPLANT
DRSG TEGADERM 2-3/8X2-3/4 SM (GAUZE/BANDAGES/DRESSINGS) IMPLANT
ELECT REM PT RETURN 9FT ADLT (ELECTROSURGICAL) ×3
ELECTRODE REM PT RTRN 9FT ADLT (ELECTROSURGICAL) ×1 IMPLANT
GEL ULTRASOUND 8.5O AQUASONIC (MISCELLANEOUS) ×3 IMPLANT
GLOVE BIOGEL PI IND STRL 8 (GLOVE) ×1 IMPLANT
GLOVE BIOGEL PI INDICATOR 8 (GLOVE) ×2
GLOVE ECLIPSE 8.0 STRL XLNG CF (GLOVE) ×3 IMPLANT
GOWN STRL REUS W/ TWL LRG LVL3 (GOWN DISPOSABLE) ×2 IMPLANT
GOWN STRL REUS W/TWL LRG LVL3 (GOWN DISPOSABLE) ×6
IV KIT MINILOC 20X1 SAFETY (NEEDLE) IMPLANT
KIT CVR 48X5XPRB PLUP LF (MISCELLANEOUS) ×1 IMPLANT
KIT PORT POWER 8FR ISP CVUE (Catheter) ×2 IMPLANT
LIQUID BAND (GAUZE/BANDAGES/DRESSINGS) ×3 IMPLANT
NDL HYPO 25X1 1.5 SAFETY (NEEDLE) ×1 IMPLANT
NDL SAFETY ECLIPSE 18X1.5 (NEEDLE) IMPLANT
NDL SPNL 22GX3.5 QUINCKE BK (NEEDLE) IMPLANT
NEEDLE HYPO 18GX1.5 SHARP (NEEDLE)
NEEDLE HYPO 22GX1.5 SAFETY (NEEDLE) IMPLANT
NEEDLE HYPO 25X1 1.5 SAFETY (NEEDLE) ×3 IMPLANT
NEEDLE SPNL 22GX3.5 QUINCKE BK (NEEDLE) IMPLANT
PACK BASIN DAY SURGERY FS (CUSTOM PROCEDURE TRAY) ×3 IMPLANT
PENCIL BUTTON HOLSTER BLD 10FT (ELECTRODE) ×3 IMPLANT
SET SHEATH INTRODUCER 10FR (MISCELLANEOUS) IMPLANT
SHEATH COOK PEEL AWAY SET 9F (SHEATH) IMPLANT
SLEEVE SCD COMPRESS KNEE MED (MISCELLANEOUS) ×2 IMPLANT
SPONGE GAUZE 4X4 12PLY STER LF (GAUZE/BANDAGES/DRESSINGS) IMPLANT
SPONGE LAP 4X18 X RAY DECT (DISPOSABLE) ×2 IMPLANT
STRIP CLOSURE SKIN 1/2X4 (GAUZE/BANDAGES/DRESSINGS) ×1 IMPLANT
SUT MON AB 4-0 PC3 18 (SUTURE) ×3 IMPLANT
SUT PROLENE 2 0 CT2 30 (SUTURE) ×2 IMPLANT
SUT PROLENE 2 0 SH DA (SUTURE) ×3 IMPLANT
SUT SILK 2 0 TIES 17X18 (SUTURE)
SUT SILK 2-0 18XBRD TIE BLK (SUTURE) IMPLANT
SUT VIC AB 3-0 SH 27 (SUTURE) ×3
SUT VIC AB 3-0 SH 27X BRD (SUTURE) ×1 IMPLANT
SYR 5ML LUER SLIP (SYRINGE) ×3 IMPLANT
SYR CONTROL 10ML LL (SYRINGE) ×3 IMPLANT
TOWEL OR 17X24 6PK STRL BLUE (TOWEL DISPOSABLE) ×6 IMPLANT
TOWEL OR NON WOVEN STRL DISP B (DISPOSABLE) ×3 IMPLANT
TUBE CONNECTING 20'X1/4 (TUBING)
TUBE CONNECTING 20X1/4 (TUBING) IMPLANT
YANKAUER SUCT BULB TIP NO VENT (SUCTIONS) IMPLANT

## 2014-08-31 NOTE — Interval H&P Note (Signed)
History and Physical Interval Note:  08/31/2014 11:58 AM  Jamie Edwards  has presented today for surgery, with the diagnosis of cancer of breast  The various methods of treatment have been discussed with the patient and family. After consideration of risks, benefits and other options for treatment, the patient has consented to  Procedure(s): [place] (N/A) as a surgical intervention .  The patient's history has been reviewed, patient examined, no change in status, stable for surgery.  I have reviewed the patient's chart and labs.  Questions were answered to the patient's satisfaction.     Keyaan Lederman A.

## 2014-08-31 NOTE — H&P (Signed)
Jamie Edwards is an 43 y.o. female.   Chief Complaint: breast cancer HPI: Jamie Edwards 43 y.o. female is here because of recent diagnosis of Right-sided breast cancer. Patient felt a palpable lump in the right breast and then subsequently underwent a breast mammogram that led to an ultrasound which revealed a 4.3 cm abnormality. She underwent an MRI of the breasts that revealed a 7.5 cm extent of the abnormality. 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 She was presented this morning in the multidisciplinary tumor board and she is here today to discuss the treatment plan. She said today accompanied by her fianc and her daughter and her sister.   Pt need access for chemotherapy and is here for port placement  Past Medical History  Diagnosis Date  . Breast cancer   . Medical history non-contributory   . Anxiety     Past Surgical History  Procedure Laterality Date  . Mouth surgery      implants    Family History  Problem Relation Age of Onset  . Breast cancer Mother 42  . Prostate cancer Father 56  . Esophageal cancer Paternal Uncle     smoker  . Breast cancer Cousin    Social History:  reports that she has never smoked. She does not have any smokeless tobacco history on file. She reports that she does not drink alcohol or use illicit drugs.  Allergies:  Allergies  Allergen Reactions  . Penicillins Other (See Comments)    Stomach upset    Medications Prior to Admission  Medication Sig Dispense Refill  . ALPRAZolam (XANAX) 0.25 MG tablet Take 0.25 mg by mouth 2 (two) times daily as needed for anxiety.      No results found for this or any previous visit (from the past 48 hour(s)). No results found.  Review of Systems  Constitutional: Negative.   HENT: Negative.   All other systems reviewed and are negative.   Blood pressure 118/70,  pulse 75, temperature 98.1 F (36.7 C), temperature source Oral, resp. rate 18, height $RemoveBe'5\' 7"'XlJQmCLxa$  (1.702 m), weight 208 lb (94.348 kg), last menstrual period 07/31/2014, SpO2 100 %. Physical Exam  GENERAL:alert, no distress and comfortable SKIN: skin color, texture, turgor are normal, no rashes or significant lesions EYES: normal, conjunctiva are pink and non-injected, sclera clear OROPHARYNX:no exudate, no erythema and lips, buccal mucosa, and tongue normal  NECK: supple, thyroid normal size, non-tender, without nodularity LYMPH: no palpable lymphadenopathy in the cervical, axillary or inguinal LUNGS: clear to auscultation and percussion with normal breathing effort HEART: regular rate & rhythm and no murmurs and no lower extremity edema ABDOMEN:abdomen soft, non-tender and normal bowel sounds Musculoskeletal:no cyanosis of digits and no clubbing  PSYCH: alert & oriented x 3 with fluent speech NEURO: no focal motor/sensory deficits BREAST:right breast lump is measured to be around 6 cm and physical exam. No palpable axillary or supraclavicular lymphadenopathy  Assessment/Plan Stage 3 right breast cancer and poor venous access.  Pt in need of port.  The procedure has been discussed with the patient.  Alternative therapies have been discussed with the patient.  Operative risks include bleeding,  Infection,  Organ injury,  Nerve injury,  Blood vessel injury,  DVT, PTX  Pulmonary embolism, CATHETER MIGRATION   Death,  And possible reoperation.  Medical management risks include worsening of present situation.  The success of the procedure is 50 -90 % at treating patients symptoms.  The patient understands and agrees to proceed.  Jozsef Wescoat A. 08/31/2014, 11:55 AM

## 2014-08-31 NOTE — Anesthesia Procedure Notes (Signed)
Procedure Name: LMA Insertion Date/Time: 08/31/2014 12:29 PM Performed by: Derren Suydam Pre-anesthesia Checklist: Patient identified, Emergency Drugs available, Suction available and Patient being monitored Patient Re-evaluated:Patient Re-evaluated prior to inductionOxygen Delivery Method: Circle System Utilized Preoxygenation: Pre-oxygenation with 100% oxygen Intubation Type: IV induction Ventilation: Mask ventilation without difficulty LMA: LMA inserted LMA Size: 4.0 Number of attempts: 1 Airway Equipment and Method: bite block Placement Confirmation: positive ETCO2 Tube secured with: Tape Dental Injury: Teeth and Oropharynx as per pre-operative assessment

## 2014-08-31 NOTE — Anesthesia Postprocedure Evaluation (Signed)
  Anesthesia Post-op Note  Patient: Jamie Edwards  Procedure(s) Performed: Procedure(s): INSERTION PORT-A-CATH WITH ULTRA SOUND GUIDENCE (Right)  Patient Location: PACU  Anesthesia Type:General  Level of Consciousness: awake and alert   Airway and Oxygen Therapy: Patient Spontanous Breathing  Post-op Pain: mild  Post-op Assessment: Post-op Vital signs reviewed, Patient's Cardiovascular Status Stable and Respiratory Function Stable  Post-op Vital Signs: Reviewed  Filed Vitals:   08/31/14 1400  BP: 127/92  Pulse: 88  Temp:   Resp: 17    Complications: No apparent anesthesia complications

## 2014-08-31 NOTE — Op Note (Signed)
Jamie Edwards 1970/12/22 [id number] 08/31/2014   Preoperative diagnosis: PAC needed  Postoperative diagnosis: Same  Procedure: Portacath Placement right IJ with ultrasound and fluoroscopic guidence  Surgeon: Turner Daniels, MD, FACS  Anesthesia: General  Clinical History and Indications: The patient is getting ready to begin chemotherapy for her cancer. She  needs a Port-A-Cath for venous access.  Description of Procedure: I have seen the patient in the holding area and confirmed the plans for the procedure as noted above. I reviewed the risks and complications again and the patient has no further questions. She wishes to proceed.   The patient was then taken to the operating room. After satisfactory LMA  anesthesia had been obtained the upper chest and lower neck were prepped and draped as a sterile field. The timeout was done.  The right INTERNAL JUGULAR  vein was entered under ultrasound guidance  and the guidewire threaded into the superior vena cava right atrial area under fluoroscopic guidance. An incision was then made on the anterior chest wall and a subcutaneous pocket fashioned for the port reservoir.  The port tubing was then brought through a subcutaneous tunnel from the port site to the guidewire site. The dilator and peel-away sheath were then advanced over the guidewire while monitoring this with fluoroscopy. The guidewire and dilator were removed and the tubing threaded to approximately 22 cm. The peel-away sheath was then removed. The catheter aspirated and flushed easily. Using fluoroscopy the tip was backed out into the superior vena cava right atrial junction area. It aspirated and flushed easily. The reservoir was attached and the locking mechanism engaged. That aspirated and flushed easily.  The reservoir was secured to the fascia with  sutures of 2-0 Prolene. A final check with fluoroscopy was done to make sure we had no kinks and good positioning of the tip of  the catheter. Everything appeared to be okay. The catheter was aspirated, flushed with dilute heparin and then concentrated aqueous heparin.  The incision was then closed with interrupted 3-0 Vicryl, and 4-0 Monocryl subcuticular with Dermabond on the skin.  There were no operative complications. Estimated blood loss was minimal. All counts were correct. The patient tolerated the procedure well.  Turner Daniels, MD, FACS 08/31/2014 1:26 PM

## 2014-08-31 NOTE — Progress Notes (Signed)
Called pt - let her know 5 prescriptions for treatment next week are at [facility].  Pt voiced understanding.

## 2014-08-31 NOTE — Discharge Instructions (Signed)
Implanted Port Insertion An implanted port is a central line that has a round shape and is placed under the skin. It is used as a long-term IV access for:   Medicines, such as chemotherapy.   Fluids.   Liquid nutrition, such as total parenteral nutrition (TPN).   Blood samples.  LET [facility] CARE PROVIDER KNOW ABOUT:  Allergies to food or medicine.   Medicines taken, including vitamins, herbs, eye drops, creams, and over-the-counter medicines.   Any allergies to heparin.  Use of steroids (by mouth or creams).   Previous problems with anesthetics or numbing medicines.   History of bleeding problems or blood clots.   Previous surgery.   Other health problems, including diabetes and kidney problems.   Possibility of pregnancy, if this applies. RISKS AND COMPLICATIONS Generally, this is a safe procedure. However, as with any procedure, problems can occur. Possible problems include:  Damage to the blood vessel, bruising, or bleeding at the puncture site.   Infection.  Blood clot in the vessel that the port is in.  Breakdown of the skin over your port.  Very rarely a person may develop a condition called a pneumothorax, a collection of air in the chest that may cause one of the lungs to collapse. The placement of these catheters with the appropriate imaging guidance significantly decreases the risk of a pneumothorax.  BEFORE THE PROCEDURE   Your health care provider may want you to have blood tests. These tests can help tell how well your kidneys and liver are working. They can also show how well your blood clots.   If you take blood thinners (anticoagulant medicines), ask your health care provider when you should stop taking them.   Make arrangements for someone to drive you home. This is necessary if you have been sedated for your procedure.  PROCEDURE  Port insertion usually takes about 30-45 minutes.   An IV needle will be inserted in your arm.  Medicine for pain and medicine to help relax you (sedative) will flow directly into your body through this needle.   You will lie on an exam table, and you will be connected to monitors to keep track of your heart rate, blood pressure, and breathing throughout the procedure.  An oxygen monitoring device may be attached to your finger. Oxygen will be given.   Everything will be kept as germ free (sterile) as possible during the procedure. The skin near the point of the incision will be cleansed with antiseptic, and the area will be draped with sterile towels. The skin and deeper tissues over the port area will be made numb with a local anesthetic.  Two small cuts (incisions) will be made in the skin to insert the port. One will be made in the neck to obtain access to the vein where the catheter will lie.   Because the port reservoir will be placed under the skin, a small skin incision will be made in the upper chest, and a small pocket for the port will be made under the skin. The catheter that will be connected to the port tunnels to a large central vein in the chest. A small, raised area will remain on your body at the site of the reservoir when the procedure is complete.  The port placement will be done under imaging guidance to ensure the proper placement.  The reservoir has a silicone covering that can be punctured with a special needle.   The port will be flushed with normal  saline, and blood will be drawn to make sure it is working properly.  There will be nothing remaining outside the skin when the procedure is finished.   Incisions will be held together by stitches, surgical glue, or a special tape. AFTER THE PROCEDURE  You will stay in a recovery area until the anesthesia has worn off. Your blood pressure and pulse will be checked.  A final chest X-ray will be taken to check the placement of the port and to ensure that there is no injury to your lung. Document Released:  07/14/2013 Document Revised: 02/07/2014 Document Reviewed: 07/14/2013 [facility] Patient Information 2015 [place], [place]. This information is not intended to replace advice given to you by your health care provider. Make sure you discuss any questions you have with your health care provider.    Post Anesthesia Home Care Instructions  Activity: Get plenty of rest for the remainder of the day. A responsible adult should stay with you for 24 hours following the procedure.  For the next 24 hours, DO NOT: -Drive a car -Paediatric nurse -Drink alcoholic beverages -Take any medication unless instructed by your physician -Make any legal decisions or sign important papers.  Meals: Start with liquid foods such as gelatin or soup. Progress to regular foods as tolerated. Avoid greasy, spicy, heavy foods. If nausea and/or vomiting occur, drink only clear liquids until the nausea and/or vomiting subsides. Call your physician if vomiting continues.  Special Instructions/Symptoms: Your throat may feel dry or sore from the anesthesia or the breathing tube placed in your throat during surgery. If this causes discomfort, gargle with warm salt water. The discomfort should disappear within 24 hours.

## 2014-08-31 NOTE — Anesthesia Preprocedure Evaluation (Signed)
Anesthesia Evaluation  Patient identified by MRN, date of birth, ID band Patient awake    Reviewed: Allergy & Precautions, H&P , NPO status , Patient's Chart, lab work & pertinent test results  Airway Mallampati: II  TM Distance: >3 FB Neck ROM: Full    Dental no notable dental hx. (+) Teeth Intact, Dental Advisory Given   Pulmonary neg pulmonary ROS,  breath sounds clear to auscultation  Pulmonary exam normal       Cardiovascular negative cardio ROS  Rhythm:Regular Rate:Normal     Neuro/Psych Anxiety negative neurological ROS  negative psych ROS   GI/Hepatic negative GI ROS, Neg liver ROS,   Endo/Other  negative endocrine ROS  Renal/GU negative Renal ROS  negative genitourinary   Musculoskeletal   Abdominal   Peds  Hematology negative hematology ROS (+)   Anesthesia Other Findings   Reproductive/Obstetrics negative OB ROS                             Anesthesia Physical Anesthesia Plan  ASA: II  Anesthesia Plan: General   Post-op Pain Management:    Induction: Intravenous  Airway Management Planned: LMA  Additional Equipment:   Intra-op Plan:   Post-operative Plan: Extubation in OR  Informed Consent: I have reviewed the patients History and Physical, chart, labs and discussed the procedure including the risks, benefits and alternatives for the proposed anesthesia with the patient or authorized representative who has indicated his/her understanding and acceptance.   Dental advisory given  Plan Discussed with: CRNA  Anesthesia Plan Comments:         Anesthesia Quick Evaluation

## 2014-08-31 NOTE — Transfer of Care (Signed)
Immediate Anesthesia Transfer of Care Note  Patient: Jamie Edwards  Procedure(s) Performed: Procedure(s): INSERTION PORT-A-CATH WITH ULTRA SOUND GUIDENCE (Left)  Patient Location: PACU  Anesthesia Type:General  Level of Consciousness: awake, alert , oriented and patient cooperative  Airway & Oxygen Therapy: Patient Spontanous Breathing and Patient connected to face mask oxygen  Post-op Assessment: Report given to PACU RN and Post -op Vital signs reviewed and stable  Post vital signs: Reviewed and stable  Complications: No apparent anesthesia complications

## 2014-09-02 ENCOUNTER — Ambulatory Visit (HOSPITAL_COMMUNITY)
Admission: RE | Admit: 2014-09-02 | Discharge: 2014-09-02 | Disposition: A | Discharge status: Home / Self Care | Payer: 59 | Source: Ambulatory Visit | Attending: Hematology and Oncology | Admitting: Hematology and Oncology

## 2014-09-02 ENCOUNTER — Encounter: Payer: Self-pay | Admitting: Hematology and Oncology

## 2014-09-02 ENCOUNTER — Encounter (HOSPITAL_COMMUNITY): Payer: Self-pay

## 2014-09-02 DIAGNOSIS — N63 Unspecified lump in breast: Secondary | ICD-10-CM | POA: Insufficient documentation

## 2014-09-02 DIAGNOSIS — N852 Hypertrophy of uterus: Secondary | ICD-10-CM | POA: Diagnosis not present

## 2014-09-02 DIAGNOSIS — D259 Leiomyoma of uterus, unspecified: Secondary | ICD-10-CM | POA: Insufficient documentation

## 2014-09-02 DIAGNOSIS — C50919 Malignant neoplasm of unspecified site of unspecified female breast: Secondary | ICD-10-CM | POA: Diagnosis present

## 2014-09-02 DIAGNOSIS — Z9221 Personal history of antineoplastic chemotherapy: Secondary | ICD-10-CM | POA: Diagnosis present

## 2014-09-02 DIAGNOSIS — Q625 Duplication of ureter: Secondary | ICD-10-CM | POA: Diagnosis not present

## 2014-09-02 DIAGNOSIS — J9811 Atelectasis: Secondary | ICD-10-CM | POA: Insufficient documentation

## 2014-09-02 DIAGNOSIS — C50411 Malignant neoplasm of upper-outer quadrant of right female breast: Secondary | ICD-10-CM

## 2014-09-02 MED ORDER — IOHEXOL 300 MG/ML  SOLN
100.0000 mL | Freq: Once | INTRAMUSCULAR | Status: AC | PRN
  Administered 2014-09-02: 100 mL via INTRAVENOUS

## 2014-09-02 MED ORDER — TECHNETIUM TC 99M MEDRONATE IV KIT: 26.2000 | PACK | Freq: Once | INTRAVENOUS | Status: AC | PRN

## 2014-09-02 NOTE — Progress Notes (Signed)
Put fmla form on nurse's desk °

## 2014-09-05 ENCOUNTER — Encounter (HOSPITAL_BASED_OUTPATIENT_CLINIC_OR_DEPARTMENT_OTHER): Payer: Self-pay | Admitting: Surgery

## 2014-09-05 ENCOUNTER — Ambulatory Visit (HOSPITAL_COMMUNITY): Payer: 59

## 2014-09-06 ENCOUNTER — Ambulatory Visit: Payer: 59 | Admitting: Hematology and Oncology

## 2014-09-06 ENCOUNTER — Other Ambulatory Visit: Payer: 59

## 2014-09-06 ENCOUNTER — Encounter: Payer: Self-pay | Admitting: Hematology and Oncology

## 2014-09-06 NOTE — Progress Notes (Signed)
Faxed fmla form to Rosa McDougal @ [phone] °

## 2014-09-07 ENCOUNTER — Other Ambulatory Visit (HOSPITAL_BASED_OUTPATIENT_CLINIC_OR_DEPARTMENT_OTHER): Payer: 59

## 2014-09-07 ENCOUNTER — Telehealth: Payer: Self-pay | Admitting: Hematology and Oncology

## 2014-09-07 ENCOUNTER — Ambulatory Visit (HOSPITAL_BASED_OUTPATIENT_CLINIC_OR_DEPARTMENT_OTHER): Payer: 59 | Admitting: Hematology and Oncology

## 2014-09-07 VITALS — BP 123/76 | HR 90 | Temp 98.1°F | Resp 20 | Ht 67.0 in | Wt 209.0 lb

## 2014-09-07 DIAGNOSIS — C50411 Malignant neoplasm of upper-outer quadrant of right female breast: Secondary | ICD-10-CM

## 2014-09-07 DIAGNOSIS — Z171 Estrogen receptor negative status [ER-]: Secondary | ICD-10-CM

## 2014-09-07 LAB — COMPREHENSIVE METABOLIC PANEL (CC13)
ALT: 37 U/L (ref 0–55)
ANION GAP: 9 meq/L (ref 3–11)
AST: 44 U/L — ABNORMAL HIGH (ref 5–34)
Albumin: 3.7 g/dL (ref 3.5–5.0)
Alkaline Phosphatase: 101 U/L (ref 40–150)
BUN: 11.3 mg/dL (ref 7.0–26.0)
CO2: 29 meq/L (ref 22–29)
Calcium: 10.1 mg/dL (ref 8.4–10.4)
Chloride: 100 mEq/L (ref 98–109)
Creatinine: 0.8 mg/dL (ref 0.6–1.1)
Glucose: 98 mg/dl (ref 70–140)
Potassium: 3.8 mEq/L (ref 3.5–5.1)
SODIUM: 138 meq/L (ref 136–145)
TOTAL PROTEIN: 7.9 g/dL (ref 6.4–8.3)
Total Bilirubin: 0.39 mg/dL (ref 0.20–1.20)

## 2014-09-07 LAB — CBC WITH DIFFERENTIAL/PLATELET
BASO%: 1 % (ref 0.0–2.0)
Basophils Absolute: 0.1 10*3/uL (ref 0.0–0.1)
EOS%: 0.8 % (ref 0.0–7.0)
Eosinophils Absolute: 0 10*3/uL (ref 0.0–0.5)
HCT: 37.2 % (ref 34.8–46.6)
HGB: 11.8 g/dL (ref 11.6–15.9)
LYMPH%: 19.6 % (ref 14.0–49.7)
MCH: 26.7 pg (ref 25.1–34.0)
MCHC: 31.7 g/dL (ref 31.5–36.0)
MCV: 84.2 fL (ref 79.5–101.0)
MONO#: 0.6 10*3/uL (ref 0.1–0.9)
MONO%: 10.1 % (ref 0.0–14.0)
NEUT#: 4.4 10*3/uL (ref 1.5–6.5)
NEUT%: 68.5 % (ref 38.4–76.8)
Platelets: 341 10*3/uL (ref 145–400)
RBC: 4.41 10*6/uL (ref 3.70–5.45)
RDW: 14.8 % — ABNORMAL HIGH (ref 11.2–14.5)
WBC: 6.3 10*3/uL (ref 3.9–10.3)
lymph#: 1.2 10*3/uL (ref 0.9–3.3)

## 2014-09-07 NOTE — Assessment & Plan Note (Signed)
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, T3, N1, M0 stage IIIa clinical stage ER/PR HER-2 negative (triple negative): Biopsy right axillary lymph node also positive for cancer: Ki-67 90%  Staging evaluation: Patient had CT chest abdomen and pelvis and bone scan all of which were normal without any evidence of distant metastatic disease.  Patient will start neoadjuvant chemotherapy tomorrow with dose dense Adriamycin and Cytoxan X 4 followed by weekly Taxol and carboplatin x12.patient understands this and benefits and signed the consent form today. She has elected to not enroll in PREVENT clinical trial.  Return to clinic next week for toxicity check.

## 2014-09-07 NOTE — Progress Notes (Signed)
Patient Care Team: Kelton Pillar, MD as PCP - General (Family Medicine) Rulon Eisenmenger, MD as Consulting Physician (Hematology and Oncology) Excell Seltzer, MD as Consulting Physician (General Surgery) Eppie Gibson, MD as Attending Physician (Radiation Oncology)  DIAGNOSIS: Breast cancer of upper-outer quadrant of right female breast   Staging form: Breast, AJCC 7th Edition     Clinical: Stage IIIA (T3, N1, M0) - Unsigned       Staging comments: Staged at breast conference 11.11.15    SUMMARY OF ONCOLOGIC HISTORY:   Breast cancer of upper-outer quadrant of right female breast   08/05/2014 Initial Diagnosis Invasive ductal carcinoma, 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, grade 3 ER 0%, PR 0%, HER-2 negative, Ki-67 90%: Right axillary lymph node positive for breast cancer   08/12/2014 Breast MRI 4.5x 7.5 x4 cm biopsy-proven right breast upper outer quadrant cancer with biopsy-proven level I right axillary lymph node with 2 level II right axillary lymph nodes identified    CHIEF COMPLIANT: followup to discuss and finalize a treatment plan  INTERVAL HISTORY: Jamie Edwards is a 43 year old lady with above-mentioned history of right-sided breast cancer she is here today to sign the consent form and to start neoadjuvant chemotherapy tomorrow with dose dense Adriamycin and Cytoxan. She had been through chemotherapy as well as port placement an echocardiogram is also be done. Her staging evaluation check CT chest abdomen pelvis and a bone scan which are all normal. Echocardiogram revealed an EF of 60-65%.  REVIEW OF SYSTEMS:   Constitutional: Denies fevers, chills or abnormal weight loss Eyes: Denies blurriness of vision Ears, nose, mouth, throat, and face: Denies mucositis or sore throat Respiratory: Denies cough, dyspnea or wheezes Cardiovascular: Denies palpitation, chest discomfort or lower extremity swelling Gastrointestinal:  Denies nausea, heartburn  or change in bowel habits Skin: Denies abnormal skin rashes Lymphatics: Denies new lymphadenopathy or easy bruising Neurological:Denies numbness, tingling or new weaknesses Behavioral/Psych: Mood is stable, no new changes  All other systems were reviewed with the patient and are negative.  I have reviewed the past medical history, past surgical history, social history and family history with the patient and they are unchanged from previous note.  ALLERGIES:  is allergic to penicillins.  MEDICATIONS:  Current Outpatient Prescriptions  Medication Sig Dispense Refill  . ALPRAZolam (XANAX) 0.25 MG tablet Take 0.25 mg by mouth 2 (two) times daily as needed for anxiety.    Marland Kitchen dexamethasone (DECADRON) 4 MG tablet Take 2 tablets by mouth once a day on the day after chemotherapy and then take 2 tablets two times a day for 2 days. Take with food. 30 tablet 1  . lidocaine-prilocaine (EMLA) cream Apply to affected area once 30 g 3  . LORazepam (ATIVAN) 0.5 MG tablet Take 1 tablet (0.5 mg total) by mouth every 6 (six) hours as needed (Nausea or vomiting). 30 tablet 0  . ondansetron (ZOFRAN) 8 MG tablet Take 1 tablet (8 mg total) by mouth 2 (two) times daily as needed. Start on the third day after chemotherapy. 30 tablet 1  . oxyCODONE-acetaminophen (ROXICET) 5-325 MG per tablet Take 1-2 tablets by mouth every 4 (four) hours as needed. 30 tablet 0  . prochlorperazine (COMPAZINE) 10 MG tablet Take 1 tablet (10 mg total) by mouth every 6 (six) hours as needed (Nausea or vomiting). 30 tablet 1   No current facility-administered medications for this visit.    PHYSICAL EXAMINATION: ECOG PERFORMANCE STATUS: 0 - Asymptomatic  Filed Vitals:   09/07/14  1328  BP: 123/76  Pulse: 90  Temp: 98.1 F (36.7 C)  Resp: 20   Filed Weights   09/07/14 1328  Weight: 209 lb (94.802 kg)    GENERAL:alert, no distress and comfortable SKIN: skin color, texture, turgor are normal, no rashes or significant  lesions EYES: normal, Conjunctiva are pink and non-injected, sclera clear OROPHARYNX:no exudate, no erythema and lips, buccal mucosa, and tongue normal  NECK: supple, thyroid normal size, non-tender, without nodularity LYMPH:  no palpable lymphadenopathy in the cervical, axillary or inguinal LUNGS: clear to auscultation and percussion with normal breathing effort HEART: regular rate & rhythm and no murmurs and no lower extremity edema ABDOMEN:abdomen soft, non-tender and normal bowel sounds Musculoskeletal:no cyanosis of digits and no clubbing  NEURO: alert & oriented x 3 with fluent speech, no focal motor/sensory deficits  LABORATORY DATA:  I have reviewed the data as listed   Chemistry      Component Value Date/Time   NA 138 08/17/2014 1226   K 3.9 08/17/2014 1226   CO2 26 08/17/2014 1226   BUN 8.9 08/17/2014 1226   CREATININE 0.8 08/17/2014 1226      Component Value Date/Time   CALCIUM 10.5* 08/17/2014 1226   ALKPHOS 84 08/17/2014 1226   AST 31 08/17/2014 1226   ALT 30 08/17/2014 1226   BILITOT 0.34 08/17/2014 1226       Lab Results  Component Value Date   WBC 6.3 09/07/2014   HGB 11.8 09/07/2014   HCT 37.2 09/07/2014   MCV 84.2 09/07/2014   PLT 341 09/07/2014   NEUTROABS 4.4 09/07/2014     RADIOGRAPHIC STUDIES: I have personally reviewed the radiology reports and agreed with their findings. 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  Patient will start neoadjuvant chemotherapy tomorrow with dose dense Adriamycin and  Cytoxan X 4 followed by weekly Taxol and carboplatin x12.patient understands this and benefits and signed the consent form today. She has elected to not enroll in PREVENT clinical trial.  Return to clinic next week for toxicity check.   Orders Placed This Encounter  Procedures  . CBC with Differential  . Comprehensive metabolic panel  . CBC with Differential    Standing Status: Future     Number of Occurrences:      Standing Expiration Date: 09/07/2015  . Comprehensive metabolic panel (Cmet) - [facility]    Standing Status: Future     Number of Occurrences:      Standing Expiration Date: 09/07/2015  . TSH    Standing Status: Future     Number of Occurrences:      Standing Expiration Date: 10/12/2015   The patient has a good understanding of the overall plan. she agrees with it. She will call with any problems that may develop before her next visit here.   Rulon Eisenmenger, MD 09/07/2014 1:42 PM

## 2014-09-07 NOTE — Progress Notes (Signed)
Jamie Edwards is a very pleasant 43 y.o.Marland Kitchen female from [place], [place] with newly diagnosed grade 3 invasive ductal carcinoma of the right breast.  Biopsy results revealed the tumor's hormone status as ER negative, PR negative, and HER2/neu negative. Right axillary lymph node was also biopsied and was positive for metastatic disease. Ki67 is 90%.  I briefly met with Jamie Edwards following her visit with her medical oncologist, Dr. Lindi Adie.  We discussed the purpose of the Survivorship Clinic, which will include monitoring for recurrence, coordinating completion of age and gender-appropriate cancer screenings, promotion of overall wellness, as well as managing potential late/long-term side effects of anti-cancer treatments.    Her recent staging diagnostic imaging did not reveal evidence of any distant metastatic disease.  Therefore, the intent of treatment for Jamie Edwards is cure and she will be eligible for the Survivorship Clinic upon her completion of treatment.  Her survivorship care plan (SCP) document will be drafted and updated throughout the course of her treatment trajectory. She will receive the SCP in an office visit with myself in the Survivorship Clinic once she has completed treatment.   Jamie Edwards was encouraged to ask questions and all questions were answered to her satisfaction.  She was given my business card and encouraged to contact me with any concerns regarding survivorship.  I look forward to  participating in her care.   Mike Craze, NP

## 2014-09-07 NOTE — Telephone Encounter (Signed)
per pof to sch pt appt-pt will get updated sch 12/3 while here 4 inf

## 2014-09-08 ENCOUNTER — Ambulatory Visit (HOSPITAL_BASED_OUTPATIENT_CLINIC_OR_DEPARTMENT_OTHER): Payer: 59

## 2014-09-08 DIAGNOSIS — C773 Secondary and unspecified malignant neoplasm of axilla and upper limb lymph nodes: Secondary | ICD-10-CM

## 2014-09-08 DIAGNOSIS — Z5111 Encounter for antineoplastic chemotherapy: Secondary | ICD-10-CM

## 2014-09-08 DIAGNOSIS — C50411 Malignant neoplasm of upper-outer quadrant of right female breast: Secondary | ICD-10-CM

## 2014-09-08 MED ORDER — SODIUM CHLORIDE 0.9 % IV SOLN
600.0000 mg/m2 | Freq: Once | INTRAVENOUS | Status: AC
  Administered 2014-09-08: 1260 mg via INTRAVENOUS
  Filled 2014-09-08: qty 63

## 2014-09-08 MED ORDER — SODIUM CHLORIDE 0.9 % IJ SOLN
10.0000 mL | INTRAMUSCULAR | Status: DC | PRN
  Administered 2014-09-08: 10 mL
  Filled 2014-09-08: qty 10

## 2014-09-08 MED ORDER — PALONOSETRON HCL INJECTION 0.25 MG/5ML
INTRAVENOUS | Status: AC
  Filled 2014-09-08: qty 5

## 2014-09-08 MED ORDER — SODIUM CHLORIDE 0.9 % IV SOLN
Freq: Once | INTRAVENOUS | Status: AC
  Administered 2014-09-08: 09:00:00 via INTRAVENOUS

## 2014-09-08 MED ORDER — PALONOSETRON HCL INJECTION 0.25 MG/5ML
0.2500 mg | Freq: Once | INTRAVENOUS | Status: AC
  Administered 2014-09-08: 0.25 mg via INTRAVENOUS

## 2014-09-08 MED ORDER — SODIUM CHLORIDE 0.9 % IV SOLN
150.0000 mg | Freq: Once | INTRAVENOUS | Status: AC
  Administered 2014-09-08: 150 mg via INTRAVENOUS
  Filled 2014-09-08: qty 5

## 2014-09-08 MED ORDER — DEXAMETHASONE SODIUM PHOSPHATE 20 MG/5ML IJ SOLN
INTRAMUSCULAR | Status: AC
  Filled 2014-09-08: qty 5

## 2014-09-08 MED ORDER — HEPARIN SOD (PORK) LOCK FLUSH 100 UNIT/ML IV SOLN
500.0000 [IU] | Freq: Once | INTRAVENOUS | Status: AC | PRN
Start: 2014-09-08 — End: 2014-09-08
  Administered 2014-09-08: 500 [IU]
  Filled 2014-09-08: qty 5

## 2014-09-08 MED ORDER — DEXAMETHASONE SODIUM PHOSPHATE 20 MG/5ML IJ SOLN
12.0000 mg | Freq: Once | INTRAMUSCULAR | Status: AC
  Administered 2014-09-08: 12 mg via INTRAVENOUS

## 2014-09-08 MED ORDER — DOXORUBICIN HCL CHEMO IV INJECTION 2 MG/ML
60.0000 mg/m2 | Freq: Once | INTRAVENOUS | Status: AC
  Administered 2014-09-08: 126 mg via INTRAVENOUS
  Filled 2014-09-08: qty 63

## 2014-09-08 NOTE — Patient Instructions (Signed)
[facility] Discharge Instructions for Patients Receiving Chemotherapy  Today you received the following chemotherapy agents Adriamycin, Cytoxan  To help prevent nausea and vomiting after your treatment, we encourage you to take your nausea medication   If you develop nausea and vomiting that is not controlled by your nausea medication, call the clinic.   BELOW ARE SYMPTOMS THAT SHOULD BE REPORTED IMMEDIATELY:  *FEVER GREATER THAN 100.5 F  *CHILLS WITH OR WITHOUT FEVER  NAUSEA AND VOMITING THAT IS NOT CONTROLLED WITH YOUR NAUSEA MEDICATION  *UNUSUAL SHORTNESS OF BREATH  *UNUSUAL BRUISING OR BLEEDING  TENDERNESS IN MOUTH AND THROAT WITH OR WITHOUT PRESENCE OF ULCERS  *URINARY PROBLEMS  *BOWEL PROBLEMS  UNUSUAL RASH Items with * indicate a potential emergency and should be followed up as soon as possible.  Feel free to call the clinic you have any questions or concerns. The clinic phone number is [phone].   Cyclophosphamide injection What is this medicine? CYCLOPHOSPHAMIDE (sye kloe FOSS fa mide) is a chemotherapy drug. It slows the growth of cancer cells. This medicine is used to treat many types of cancer like lymphoma, myeloma, leukemia, breast cancer, and ovarian cancer, to name a few. This medicine may be used for other purposes; ask your health care provider or pharmacist if you have questions. COMMON BRAND NAME(S): Cytoxan, Neosar What should I tell my health care provider before I take this medicine? They need to know if you have any of these conditions: -blood disorders -history of other chemotherapy -infection -kidney disease -liver disease -recent or ongoing radiation therapy -tumors in the bone marrow -an unusual or allergic reaction to cyclophosphamide, other chemotherapy, other medicines, foods, dyes, or preservatives -pregnant or trying to get pregnant -breast-feeding How should I use this medicine? This drug is usually given as an  injection into a vein or muscle or by infusion into a vein. It is administered in a hospital or clinic by a specially trained health care professional. Talk to your pediatrician regarding the use of this medicine in children. Special care may be needed. Overdosage: If you think you have taken too much of this medicine contact a poison control center or emergency room at once. NOTE: This medicine is only for you. Do not share this medicine with others. What if I miss a dose? It is important not to miss your dose. Call your doctor or health care professional if you are unable to keep an appointment. What may interact with this medicine? This medicine may interact with the following medications: -amiodarone -amphotericin B -azathioprine -certain antiviral medicines for HIV or AIDS such as protease inhibitors (e.g., indinavir, ritonavir) and zidovudine -certain blood pressure medications such as benazepril, captopril, enalapril, fosinopril, lisinopril, moexipril, monopril, perindopril, quinapril, ramipril, trandolapril -certain cancer medications such as anthracyclines (e.g., daunorubicin, doxorubicin), busulfan, cytarabine, paclitaxel, pentostatin, tamoxifen, trastuzumab -certain diuretics such as chlorothiazide, chlorthalidone, hydrochlorothiazide, indapamide, metolazone -certain medicines that treat or prevent blood clots like warfarin -certain muscle relaxants such as succinylcholine -cyclosporine -etanercept -indomethacin -medicines to increase blood counts like filgrastim, pegfilgrastim, sargramostim -medicines used as general anesthesia -metronidazole -natalizumab This list may not describe all possible interactions. Give your health care provider a list of all the medicines, herbs, non-prescription drugs, or dietary supplements you use. Also tell them if you smoke, drink alcohol, or use illegal drugs. Some items may interact with your medicine. What should I watch for while using this  medicine? Visit your doctor for checks on your progress. This drug may make you feel  generally unwell. This is not uncommon, as chemotherapy can affect healthy cells as well as cancer cells. Report any side effects. Continue your course of treatment even though you feel ill unless your doctor tells you to stop. Drink water or other fluids as directed. Urinate often, even at night. In some cases, you may be given additional medicines to help with side effects. Follow all directions for their use. Call your doctor or health care professional for advice if you get a fever, chills or sore throat, or other symptoms of a cold or flu. Do not treat yourself. This drug decreases your body's ability to fight infections. Try to avoid being around people who are sick. This medicine may increase your risk to bruise or bleed. Call your doctor or health care professional if you notice any unusual bleeding. Be careful brushing and flossing your teeth or using a toothpick because you may get an infection or bleed more easily. If you have any dental work done, tell your dentist you are receiving this medicine. You may get drowsy or dizzy. Do not drive, use machinery, or do anything that needs mental alertness until you know how this medicine affects you. Do not become pregnant while taking this medicine or for 1 year after stopping it. Women should inform their doctor if they wish to become pregnant or think they might be pregnant. Men should not father a child while taking this medicine and for 4 months after stopping it. There is a potential for serious side effects to an unborn child. Talk to your health care professional or pharmacist for more information. Do not breast-feed an infant while taking this medicine. This medicine may interfere with the ability to have a child. This medicine has caused ovarian failure in some women. This medicine has caused reduced sperm counts in some men. You should talk with your doctor or  health care professional if you are concerned about your fertility. If you are going to have surgery, tell your doctor or health care professional that you have taken this medicine. What side effects may I notice from receiving this medicine? Side effects that you should report to your doctor or health care professional as soon as possible: -allergic reactions like skin rash, itching or hives, swelling of the face, lips, or tongue -low blood counts - this medicine may decrease the number of white blood cells, red blood cells and platelets. You may be at increased risk for infections and bleeding. -signs of infection - fever or chills, cough, sore throat, pain or difficulty passing urine -signs of decreased platelets or bleeding - bruising, pinpoint red spots on the skin, black, tarry stools, blood in the urine -signs of decreased red blood cells - unusually weak or tired, fainting spells, lightheadedness -breathing problems -dark urine -dizziness -palpitations -swelling of the ankles, feet, hands -trouble passing urine or change in the amount of urine -weight gain -yellowing of the eyes or skin Side effects that usually do not require medical attention (report to your doctor or health care professional if they continue or are bothersome): -changes in nail or skin color -hair loss -missed menstrual periods -mouth sores -nausea, vomiting This list may not describe all possible side effects. Call your doctor for medical advice about side effects. You may report side effects to FDA at 1-800-FDA-1088. Where should I keep my medicine? This drug is given in a hospital or clinic and will not be stored at home. NOTE: This sheet is a summary. It may not cover  all possible information. If you have questions about this medicine, talk to your doctor, pharmacist, or health care provider.  2015, Elsevier/Gold Standard. (2012-08-07 16:22:58)  Doxorubicin injection What is this medicine? DOXORUBICIN  (dox oh ROO bi sin) is a chemotherapy drug. It is used to treat many kinds of cancer like Hodgkin's disease, leukemia, non-Hodgkin's lymphoma, neuroblastoma, sarcoma, and Wilms' tumor. It is also used to treat bladder cancer, breast cancer, lung cancer, ovarian cancer, stomach cancer, and thyroid cancer. This medicine may be used for other purposes; ask your health care provider or pharmacist if you have questions. COMMON BRAND NAME(S): Adriamycin, Adriamycin PFS, Adriamycin RDF, Rubex What should I tell my health care provider before I take this medicine? They need to know if you have any of these conditions: -blood disorders -heart disease, recent heart attack -infection (especially a virus infection such as chickenpox, cold sores, or herpes) -irregular heartbeat -liver disease -recent or ongoing radiation therapy -an unusual or allergic reaction to doxorubicin, other chemotherapy agents, other medicines, foods, dyes, or preservatives -pregnant or trying to get pregnant -breast-feeding How should I use this medicine? This drug is given as an infusion into a vein. It is administered in a hospital or clinic by a specially trained health care professional. If you have pain, swelling, burning or any unusual feeling around the site of your injection, tell your health care professional right away. Talk to your pediatrician regarding the use of this medicine in children. Special care may be needed. Overdosage: If you think you have taken too much of this medicine contact a poison control center or emergency room at once. NOTE: This medicine is only for you. Do not share this medicine with others. What if I miss a dose? It is important not to miss your dose. Call your doctor or health care professional if you are unable to keep an appointment. What may interact with this medicine? Do not take this medicine with any of the following  medications: -cisapride -droperidol -halofantrine -pimozide -zidovudine This medicine may also interact with the following medications: -chloroquine -chlorpromazine -clarithromycin -cyclophosphamide -cyclosporine -erythromycin -medicines for depression, anxiety, or psychotic disturbances -medicines for irregular heart beat like amiodarone, bepridil, dofetilide, encainide, flecainide, propafenone, quinidine -medicines for seizures like ethotoin, fosphenytoin, phenytoin -medicines for nausea, vomiting like dolasetron, ondansetron, palonosetron -medicines to increase blood counts like filgrastim, pegfilgrastim, sargramostim -methadone -methotrexate -pentamidine -progesterone -vaccines -verapamil Talk to your doctor or health care professional before taking any of these medicines: -acetaminophen -aspirin -ibuprofen -ketoprofen -naproxen This list may not describe all possible interactions. Give your health care provider a list of all the medicines, herbs, non-prescription drugs, or dietary supplements you use. Also tell them if you smoke, drink alcohol, or use illegal drugs. Some items may interact with your medicine. What should I watch for while using this medicine? Your condition will be monitored carefully while you are receiving this medicine. You will need important blood work done while you are taking this medicine. This drug may make you feel generally unwell. This is not uncommon, as chemotherapy can affect healthy cells as well as cancer cells. Report any side effects. Continue your course of treatment even though you feel ill unless your doctor tells you to stop. Your urine may turn red for a few days after your dose. This is not blood. If your urine is dark or brown, call your doctor. In some cases, you may be given additional medicines to help with side effects. Follow all directions for their use. Call your  doctor or health care professional for advice if you get a  fever, chills or sore throat, or other symptoms of a cold or flu. Do not treat yourself. This drug decreases your body's ability to fight infections. Try to avoid being around people who are sick. This medicine may increase your risk to bruise or bleed. Call your doctor or health care professional if you notice any unusual bleeding. Be careful brushing and flossing your teeth or using a toothpick because you may get an infection or bleed more easily. If you have any dental work done, tell your dentist you are receiving this medicine. Avoid taking products that contain aspirin, acetaminophen, ibuprofen, naproxen, or ketoprofen unless instructed by your doctor. These medicines may hide a fever. Men and women of childbearing age should use effective birth control methods while using taking this medicine. Do not become pregnant while taking this medicine. There is a potential for serious side effects to an unborn child. Talk to your health care professional or pharmacist for more information. Do not breast-feed an infant while taking this medicine. Do not let others touch your urine or other body fluids for 5 days after each treatment with this medicine. Caregivers should wear latex gloves to avoid touching body fluids during this time. There is a maximum amount of this medicine you should receive throughout your life. The amount depends on the medical condition being treated and your overall health. Your doctor will watch how much of this medicine you receive in your lifetime. Tell your doctor if you have taken this medicine before. What side effects may I notice from receiving this medicine? Side effects that you should report to your doctor or health care professional as soon as possible: -allergic reactions like skin rash, itching or hives, swelling of the face, lips, or tongue -low blood counts - this medicine may decrease the number of white blood cells, red blood cells and platelets. You may be at  increased risk for infections and bleeding. -signs of infection - fever or chills, cough, sore throat, pain or difficulty passing urine -signs of decreased platelets or bleeding - bruising, pinpoint red spots on the skin, black, tarry stools, blood in the urine -signs of decreased red blood cells - unusually weak or tired, fainting spells, lightheadedness -breathing problems -chest pain -fast, irregular heartbeat -mouth sores -nausea, vomiting -pain, swelling, redness at site where injected -pain, tingling, numbness in the hands or feet -swelling of ankles, feet, or hands -unusual bleeding or bruising Side effects that usually do not require medical attention (report to your doctor or health care professional if they continue or are bothersome): -diarrhea -facial flushing -hair loss -loss of appetite -missed menstrual periods -nail discoloration or damage -red or watery eyes -red colored urine -stomach upset This list may not describe all possible side effects. Call your doctor for medical advice about side effects. You may report side effects to FDA at 1-800-FDA-1088. Where should I keep my medicine? This drug is given in a hospital or clinic and will not be stored at home. NOTE: This sheet is a summary. It may not cover all possible information. If you have questions about this medicine, talk to your doctor, pharmacist, or health care provider.  2015, [employer]vier/Gold Standard. (2013-01-19 09:54:34)

## 2014-09-09 ENCOUNTER — Ambulatory Visit (HOSPITAL_BASED_OUTPATIENT_CLINIC_OR_DEPARTMENT_OTHER): Payer: 59

## 2014-09-09 ENCOUNTER — Telehealth: Payer: Self-pay | Admitting: Medical Oncology

## 2014-09-09 DIAGNOSIS — Z5189 Encounter for other specified aftercare: Secondary | ICD-10-CM

## 2014-09-09 DIAGNOSIS — C50411 Malignant neoplasm of upper-outer quadrant of right female breast: Secondary | ICD-10-CM

## 2014-09-09 MED ORDER — PEGFILGRASTIM INJECTION 6 MG/0.6ML ~~LOC~~
6.0000 mg | PREFILLED_SYRINGE | Freq: Once | SUBCUTANEOUS | Status: AC
  Administered 2014-09-09: 6 mg via SUBCUTANEOUS
  Filled 2014-09-09: qty 0.6

## 2014-09-09 NOTE — Patient Instructions (Signed)
Pegfilgrastim injection What is this medicine? PEGFILGRASTIM (peg fil GRA stim) is a long-acting granulocyte colony-stimulating factor that stimulates the growth of neutrophils, a type of white blood cell important in the body's fight against infection. It is used to reduce the incidence of fever and infection in patients with certain types of cancer who are receiving chemotherapy that affects the bone marrow. This medicine may be used for other purposes; ask your health care provider or pharmacist if you have questions. COMMON BRAND NAME(S): Neulasta What should I tell my health care provider before I take this medicine? They need to know if you have any of these conditions: -latex allergy -ongoing radiation therapy -sickle cell disease -skin reactions to acrylic adhesives (On-Body Injector only) -an unusual or allergic reaction to pegfilgrastim, filgrastim, other medicines, foods, dyes, or preservatives -pregnant or trying to get pregnant -breast-feeding How should I use this medicine? This medicine is for injection under the skin. If you get this medicine at home, you will be taught how to prepare and give the pre-filled syringe or how to use the On-body Injector. Refer to the patient Instructions for Use for detailed instructions. Use exactly as directed. Take your medicine at regular intervals. Do not take your medicine more often than directed. It is important that you put your used needles and syringes in a special sharps container. Do not put them in a trash can. If you do not have a sharps container, call your pharmacist or healthcare provider to get one. Talk to your pediatrician regarding the use of this medicine in children. Special care may be needed. Overdosage: If you think you have taken too much of this medicine contact a poison control center or emergency room at once. NOTE: This medicine is only for you. Do not share this medicine with others. What if I miss a dose? It is  important not to miss your dose. Call your doctor or health care professional if you miss your dose. If you miss a dose due to an On-body Injector failure or leakage, a new dose should be administered as soon as possible using a single prefilled syringe for manual use. What may interact with this medicine? Interactions have not been studied. Give your health care provider a list of all the medicines, herbs, non-prescription drugs, or dietary supplements you use. Also tell them if you smoke, drink alcohol, or use illegal drugs. Some items may interact with your medicine. This list may not describe all possible interactions. Give your health care provider a list of all the medicines, herbs, non-prescription drugs, or dietary supplements you use. Also tell them if you smoke, drink alcohol, or use illegal drugs. Some items may interact with your medicine. What should I watch for while using this medicine? You may need blood work done while you are taking this medicine. If you are going to need a MRI, CT scan, or other procedure, tell your doctor that you are using this medicine (On-Body Injector only). What side effects may I notice from receiving this medicine? Side effects that you should report to your doctor or health care professional as soon as possible: -allergic reactions like skin rash, itching or hives, swelling of the face, lips, or tongue -dizziness -fever -pain, redness, or irritation at site where injected -pinpoint red spots on the skin -shortness of breath or breathing problems -stomach or side pain, or pain at the shoulder -swelling -tiredness -trouble passing urine Side effects that usually do not require medical attention (report to your doctor   or health care professional if they continue or are bothersome): -bone pain -muscle pain This list may not describe all possible side effects. Call your doctor for medical advice about side effects. You may report side effects to FDA at  1-800-FDA-1088. Where should I keep my medicine? Keep out of the reach of children. Store pre-filled syringes in a refrigerator between 2 and 8 degrees C (36 and 46 degrees F). Do not freeze. Keep in carton to protect from light. Throw away this medicine if it is left out of the refrigerator for more than 48 hours. Throw away any unused medicine after the expiration date. NOTE: This sheet is a summary. It may not cover all possible information. If you have questions about this medicine, talk to your doctor, pharmacist, or health care provider.  2015, [employer]vier/Gold Standard. (2013-12-23 16:14:05)  

## 2014-09-09 NOTE — Telephone Encounter (Signed)
-----   Message from [facility], [place] sent at 09/08/2014 11:44 AM EST ----- Regarding: "1st time chemotherapy" Patient received Adriamycin and Cytoxan for the 1st time today, per Dr. Lindi Adie.  Tolerated treatment well with no complaints.

## 2014-09-15 ENCOUNTER — Ambulatory Visit (HOSPITAL_BASED_OUTPATIENT_CLINIC_OR_DEPARTMENT_OTHER): Payer: 59 | Admitting: Hematology and Oncology

## 2014-09-15 ENCOUNTER — Other Ambulatory Visit (HOSPITAL_BASED_OUTPATIENT_CLINIC_OR_DEPARTMENT_OTHER): Payer: 59

## 2014-09-15 ENCOUNTER — Telehealth: Payer: Self-pay | Admitting: Hematology and Oncology

## 2014-09-15 ENCOUNTER — Other Ambulatory Visit: Payer: Self-pay | Admitting: *Deleted

## 2014-09-15 VITALS — BP 111/69 | HR 101 | Temp 97.5°F | Resp 18 | Ht 67.0 in | Wt 204.7 lb

## 2014-09-15 DIAGNOSIS — D701 Agranulocytosis secondary to cancer chemotherapy: Secondary | ICD-10-CM

## 2014-09-15 DIAGNOSIS — C50411 Malignant neoplasm of upper-outer quadrant of right female breast: Secondary | ICD-10-CM

## 2014-09-15 LAB — COMPREHENSIVE METABOLIC PANEL (CC13)
ALK PHOS: 102 U/L (ref 40–150)
ALT: 25 U/L (ref 0–55)
AST: 18 U/L (ref 5–34)
Albumin: 3.3 g/dL — ABNORMAL LOW (ref 3.5–5.0)
Anion Gap: 9 mEq/L (ref 3–11)
BILIRUBIN TOTAL: 0.72 mg/dL (ref 0.20–1.20)
BUN: 11.1 mg/dL (ref 7.0–26.0)
CO2: 30 mEq/L — ABNORMAL HIGH (ref 22–29)
CREATININE: 0.8 mg/dL (ref 0.6–1.1)
Calcium: 9.6 mg/dL (ref 8.4–10.4)
Chloride: 97 mEq/L — ABNORMAL LOW (ref 98–109)
EGFR: >90 mL/min/{1.73_m2} (ref >90)
Glucose: 147 mg/dl — ABNORMAL HIGH (ref 70–140)
Potassium: 4.1 mEq/L (ref 3.5–5.1)
Sodium: 137 mEq/L (ref 136–145)
Total Protein: 7 g/dL (ref 6.4–8.3)

## 2014-09-15 LAB — CBC WITH DIFFERENTIAL/PLATELET
BASO%: 0.2 % (ref 0.0–2.0)
Basophils Absolute: 0 10*3/uL (ref 0.0–0.1)
EOS%: 2.9 % (ref 0.0–7.0)
Eosinophils Absolute: 0.1 10*3/uL (ref 0.0–0.5)
HEMATOCRIT: 34.5 % — AB (ref 34.8–46.6)
HGB: 10.9 g/dL — ABNORMAL LOW (ref 11.6–15.9)
LYMPH%: 24.3 % (ref 14.0–49.7)
MCH: 27 pg (ref 25.1–34.0)
MCHC: 31.7 g/dL (ref 31.5–36.0)
MCV: 85.1 fL (ref 79.5–101.0)
MONO#: 0 10*3/uL — ABNORMAL LOW (ref 0.1–0.9)
MONO%: 1.4 % (ref 0.0–14.0)
NEUT#: 1.5 10*3/uL (ref 1.5–6.5)
NEUT%: 71.2 % (ref 38.4–76.8)
Platelets: 131 10*3/uL — ABNORMAL LOW (ref 145–400)
RBC: 4.05 10*6/uL (ref 3.70–5.45)
RDW: 14.3 % (ref 11.2–14.5)
WBC: 2 10*3/uL — ABNORMAL LOW (ref 3.9–10.3)
lymph#: 0.5 10*3/uL — ABNORMAL LOW (ref 0.9–3.3)

## 2014-09-15 LAB — TSH CHCC: TSH: 1.498 m(IU)/L (ref 0.308–3.960)

## 2014-09-15 NOTE — Assessment & Plan Note (Addendum)
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 Today is cycle 1 day 8 of AC  Toxicities of chemotherapy: 1. Chemotherapy-induced neutropenia 2. Fatigue Overall patient tolerated chemotherapy extremely well. I encouraged her to eat more protein in her diet. Her ANC today is 1500 and as she does not need to take any neutropenic precautions. We will maintain the chemotherapy dose at the same level. Monitoring her closely for toxicities to chemotherapy.  Return to clinic in one week for cycle 2 of treatment. 

## 2014-09-15 NOTE — Telephone Encounter (Signed)
, °

## 2014-09-15 NOTE — Progress Notes (Signed)
Patient Care Team: Kelton Pillar, MD as PCP - General (Family Medicine) Rulon Eisenmenger, MD as Consulting Physician (Hematology and Oncology) Excell Seltzer, MD as Consulting Physician (General Surgery) Eppie Gibson, MD as Attending Physician (Radiation Oncology) Trinda Pascal, NP as Nurse Practitioner (Nurse Practitioner)  DIAGNOSIS: Breast cancer of upper-outer quadrant of right female breast   Staging form: Breast, AJCC 7th Edition     Clinical: Stage IIIA (T3, N1, M0) - Unsigned       Staging comments: Staged at breast conference 11.11.15    SUMMARY OF ONCOLOGIC HISTORY:   Breast cancer of upper-outer quadrant of right female breast   08/05/2014 Initial Diagnosis Invasive ductal carcinoma, 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, grade 3 ER 0%, PR 0%, HER-2 negative, Ki-67 90%: Right axillary lymph node positive for breast cancer   08/12/2014 Breast MRI 4.5x 7.5 x4 cm biopsy-proven right breast upper outer quadrant cancer with biopsy-proven level I right axillary lymph node with 2 level II right axillary lymph nodes identified   09/08/2014 -  Neo-Adjuvant Chemotherapy Dose dense Adriamycin and Cytoxan x4 followed by weekly Taxol and carboplatin x12    CHIEF COMPLIANT: Cycle 1 day 8 dose dense Adriamycin Cytoxan neoadjuvant chemotherapy  INTERVAL HISTORY: Jamie Edwards is a 43 year old white lady with above-mentioned history of right-sided breast cancer with involved lymph node triple negative disease currently on neoadjuvant chemotherapy with dose dense Adriamycin and Cytoxan. She received cycle 1 of chemotherapy a week ago and is here for toxicity check. She reports to be feeling quite well in spite of chemotherapy. She did notice significant fatigue which appears to be improving over time. She denies any nausea vomiting or fevers or chills. She does complain of constipation for which he took MiraLAX today.  REVIEW OF SYSTEMS:    Constitutional: Denies fevers, chills or abnormal weight loss Eyes: Denies blurriness of vision Ears, nose, mouth, throat, and face: Denies mucositis or sore throat Respiratory: Denies cough, dyspnea or wheezes Cardiovascular: Denies palpitation, chest discomfort or lower extremity swelling Gastrointestinal:  Denies nausea,complains of constipation Skin: Denies abnormal skin rashes Lymphatics: Denies new lymphadenopathy or easy bruising Neurological:Denies numbness, tingling or new weaknesses Behavioral/Psych: Mood is stable, no new changes  Breast: breast lump All other systems were reviewed with the patient and are negative.  I have reviewed the past medical history, past surgical history, social history and family history with the patient and they are unchanged from previous note.  ALLERGIES:  is allergic to penicillins.  MEDICATIONS:  Current Outpatient Prescriptions  Medication Sig Dispense Refill  . ALPRAZolam (XANAX) 0.25 MG tablet Take 0.25 mg by mouth 2 (two) times daily as needed for anxiety.    Marland Kitchen dexamethasone (DECADRON) 4 MG tablet Take 2 tablets by mouth once a day on the day after chemotherapy and then take 2 tablets two times a day for 2 days. Take with food. 30 tablet 1  . lidocaine-prilocaine (EMLA) cream Apply to affected area once 30 g 3  . LORazepam (ATIVAN) 0.5 MG tablet Take 1 tablet (0.5 mg total) by mouth every 6 (six) hours as needed (Nausea or vomiting). 30 tablet 0  . ondansetron (ZOFRAN) 8 MG tablet Take 1 tablet (8 mg total) by mouth 2 (two) times daily as needed. Start on the third day after chemotherapy. 30 tablet 1  . oxyCODONE-acetaminophen (ROXICET) 5-325 MG per tablet Take 1-2 tablets by mouth every 4 (four) hours as needed. 30 tablet 0  . prochlorperazine (COMPAZINE) 10 MG  tablet Take 1 tablet (10 mg total) by mouth every 6 (six) hours as needed (Nausea or vomiting). 30 tablet 1   No current facility-administered medications for this visit.     PHYSICAL EXAMINATION: ECOG PERFORMANCE STATUS: 1 - Symptomatic but completely ambulatory  Filed Vitals:   09/15/14 0827  BP: 111/69  Pulse: 101  Temp: 97.5 F (36.4 C)  Resp: 18   Filed Weights   09/15/14 0827  Weight: 204 lb 11.2 oz (92.851 kg)    GENERAL:alert, no distress and comfortable SKIN: skin color, texture, turgor are normal, no rashes or significant lesions EYES: normal, Conjunctiva are pink and non-injected, sclera clear OROPHARYNX:no exudate, no erythema and lips, buccal mucosa, and tongue normal  NECK: supple, thyroid normal size, non-tender, without nodularity LYMPH:  no palpable lymphadenopathy in the cervical, axillary or inguinal LUNGS: clear to auscultation and percussion with normal breathing effort HEART: regular rate & rhythm and no murmurs and no lower extremity edema ABDOMEN:abdomen soft, non-tender and normal bowel sounds Musculoskeletal:no cyanosis of digits and no clubbing  NEURO: alert & oriented x 3 with fluent speech, no focal motor/sensory deficits   LABORATORY DATA:  I have reviewed the data as listed   Chemistry      Component Value Date/Time   NA 137 09/15/2014 0812   K 4.1 09/15/2014 0812   CO2 30* 09/15/2014 0812   BUN 11.1 09/15/2014 0812   CREATININE 0.8 09/15/2014 0812      Component Value Date/Time   CALCIUM 9.6 09/15/2014 0812   ALKPHOS 102 09/15/2014 0812   AST 18 09/15/2014 0812   ALT 25 09/15/2014 0812   BILITOT 0.72 09/15/2014 0812       Lab Results  Component Value Date   WBC 2.0* 09/15/2014   HGB 10.9* 09/15/2014   HCT 34.5* 09/15/2014   MCV 85.1 09/15/2014   PLT 131* 09/15/2014   NEUTROABS 1.5 09/15/2014    ASSESSMENT & PLAN:  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 Today is cycle 1 day 8 of AC  Toxicities of chemotherapy: 1. Chemotherapy-induced neutropenia 2. Fatigue Overall patient tolerated chemotherapy extremely well. I encouraged her to eat more protein in her diet. Her [place] today is 1500 and as she does not need to take any neutropenic precautions. We will maintain the chemotherapy dose at the same level. Monitoring her closely for toxicities to chemotherapy.  Return to clinic in one week for cycle 2 of treatment.   Orders Placed This Encounter  Procedures  . CBC with Differential    Standing Status: Future     Number of Occurrences:      Standing Expiration Date: 09/15/2015  . Comprehensive metabolic panel (Cmet) - [facility]    Standing Status: Future     Number of Occurrences:      Standing Expiration Date: 09/15/2015   The patient has a good understanding of the overall plan. she agrees with it. She will call with any problems that may develop before her next visit here.   Rulon Eisenmenger, MD 09/15/2014 9:22 AM

## 2014-09-22 ENCOUNTER — Other Ambulatory Visit: Payer: Self-pay | Admitting: *Deleted

## 2014-09-22 ENCOUNTER — Ambulatory Visit (HOSPITAL_BASED_OUTPATIENT_CLINIC_OR_DEPARTMENT_OTHER): Payer: 59

## 2014-09-22 ENCOUNTER — Telehealth: Payer: Self-pay | Admitting: *Deleted

## 2014-09-22 ENCOUNTER — Telehealth: Payer: Self-pay | Admitting: Hematology and Oncology

## 2014-09-22 ENCOUNTER — Other Ambulatory Visit (HOSPITAL_BASED_OUTPATIENT_CLINIC_OR_DEPARTMENT_OTHER): Payer: 59

## 2014-09-22 ENCOUNTER — Ambulatory Visit (HOSPITAL_BASED_OUTPATIENT_CLINIC_OR_DEPARTMENT_OTHER): Payer: 59 | Admitting: Hematology and Oncology

## 2014-09-22 VITALS — BP 102/63 | HR 81 | Temp 98.6°F | Resp 18 | Ht 67.0 in | Wt 201.9 lb

## 2014-09-22 DIAGNOSIS — Z5111 Encounter for antineoplastic chemotherapy: Secondary | ICD-10-CM

## 2014-09-22 DIAGNOSIS — D6481 Anemia due to antineoplastic chemotherapy: Secondary | ICD-10-CM

## 2014-09-22 DIAGNOSIS — C50411 Malignant neoplasm of upper-outer quadrant of right female breast: Secondary | ICD-10-CM

## 2014-09-22 DIAGNOSIS — D701 Agranulocytosis secondary to cancer chemotherapy: Secondary | ICD-10-CM

## 2014-09-22 DIAGNOSIS — C773 Secondary and unspecified malignant neoplasm of axilla and upper limb lymph nodes: Secondary | ICD-10-CM

## 2014-09-22 LAB — CBC WITH DIFFERENTIAL/PLATELET
BASO%: 0.5 % (ref 0.0–2.0)
Basophils Absolute: 0 10*3/uL (ref 0.0–0.1)
EOS ABS: 0 10*3/uL (ref 0.0–0.5)
EOS%: 0.3 % (ref 0.0–7.0)
HEMATOCRIT: 34.7 % — AB (ref 34.8–46.6)
HEMOGLOBIN: 11.1 g/dL — AB (ref 11.6–15.9)
LYMPH%: 21.9 % (ref 14.0–49.7)
MCH: 27.4 pg (ref 25.1–34.0)
MCHC: 32 g/dL (ref 31.5–36.0)
MCV: 85.7 fL (ref 79.5–101.0)
MONO#: 0.4 10*3/uL (ref 0.1–0.9)
MONO%: 6.9 % (ref 0.0–14.0)
NEUT#: 4.3 10*3/uL (ref 1.5–6.5)
NEUT%: 70.4 % (ref 38.4–76.8)
PLATELETS: 404 10*3/uL — AB (ref 145–400)
RBC: 4.05 10*6/uL (ref 3.70–5.45)
RDW: 14.2 % (ref 11.2–14.5)
WBC: 6.1 10*3/uL (ref 3.9–10.3)
lymph#: 1.3 10*3/uL (ref 0.9–3.3)

## 2014-09-22 LAB — COMPREHENSIVE METABOLIC PANEL (CC13)
ALT: 18 U/L (ref 0–55)
ANION GAP: 8 meq/L (ref 3–11)
AST: 17 U/L (ref 5–34)
Albumin: 3.3 g/dL — ABNORMAL LOW (ref 3.5–5.0)
Alkaline Phosphatase: 97 U/L (ref 40–150)
BUN: 9.7 mg/dL (ref 7.0–26.0)
CALCIUM: 9.5 mg/dL (ref 8.4–10.4)
CHLORIDE: 103 meq/L (ref 98–109)
CO2: 31 meq/L — AB (ref 22–29)
CREATININE: 0.8 mg/dL (ref 0.6–1.1)
EGFR: >90 mL/min/{1.73_m2} (ref >90)
Glucose: 110 mg/dl (ref 70–140)
Potassium: 3.7 mEq/L (ref 3.5–5.1)
Sodium: 142 mEq/L (ref 136–145)
TOTAL PROTEIN: 7 g/dL (ref 6.4–8.3)
Total Bilirubin: <0.2 mg/dL (ref 0.20–1.20)

## 2014-09-22 MED ORDER — SODIUM CHLORIDE 0.9 % IJ SOLN
10.0000 mL | INTRAMUSCULAR | Status: DC | PRN
  Administered 2014-09-22: 10 mL
  Filled 2014-09-22: qty 10

## 2014-09-22 MED ORDER — DEXAMETHASONE SODIUM PHOSPHATE 20 MG/5ML IJ SOLN
INTRAMUSCULAR | Status: AC
  Filled 2014-09-22: qty 5

## 2014-09-22 MED ORDER — SODIUM CHLORIDE 0.9 % IV SOLN
600.0000 mg/m2 | Freq: Once | INTRAVENOUS | Status: AC
  Administered 2014-09-22: 1260 mg via INTRAVENOUS
  Filled 2014-09-22: qty 63

## 2014-09-22 MED ORDER — DEXAMETHASONE SODIUM PHOSPHATE 20 MG/5ML IJ SOLN
12.0000 mg | Freq: Once | INTRAMUSCULAR | Status: AC
  Administered 2014-09-22: 12 mg via INTRAVENOUS

## 2014-09-22 MED ORDER — FOSAPREPITANT DIMEGLUMINE INJECTION 150 MG
150.0000 mg | Freq: Once | INTRAVENOUS | Status: AC
  Administered 2014-09-22: 150 mg via INTRAVENOUS
  Filled 2014-09-22: qty 5

## 2014-09-22 MED ORDER — HEPARIN SOD (PORK) LOCK FLUSH 100 UNIT/ML IV SOLN
500.0000 [IU] | Freq: Once | INTRAVENOUS | Status: AC | PRN
  Administered 2014-09-22: 500 [IU]
  Filled 2014-09-22: qty 5

## 2014-09-22 MED ORDER — SODIUM CHLORIDE 0.9 % IV SOLN
Freq: Once | INTRAVENOUS | Status: AC
  Administered 2014-09-22: 10:00:00 via INTRAVENOUS

## 2014-09-22 MED ORDER — DOXORUBICIN HCL CHEMO IV INJECTION 2 MG/ML
60.0000 mg/m2 | Freq: Once | INTRAVENOUS | Status: AC
  Administered 2014-09-22: 126 mg via INTRAVENOUS
  Filled 2014-09-22: qty 63

## 2014-09-22 MED ORDER — PALONOSETRON HCL INJECTION 0.25 MG/5ML
0.2500 mg | Freq: Once | INTRAVENOUS | Status: AC
  Administered 2014-09-22: 0.25 mg via INTRAVENOUS

## 2014-09-22 MED ORDER — PALONOSETRON HCL INJECTION 0.25 MG/5ML
INTRAVENOUS | Status: AC
  Filled 2014-09-22: qty 5

## 2014-09-22 NOTE — Progress Notes (Signed)
Patient Care Team: Kelton Pillar, MD as PCP - General (Family Medicine) Rulon Eisenmenger, MD as Consulting Physician (Hematology and Oncology) Excell Seltzer, MD as Consulting Physician (General Surgery) Eppie Gibson, MD as Attending Physician (Radiation Oncology) Trinda Pascal, NP as Nurse Practitioner (Nurse Practitioner)  DIAGNOSIS: Breast cancer of upper-outer quadrant of right female breast   Staging form: Breast, AJCC 7th Edition     Clinical: Stage IIIA (T3, N1, M0) - Unsigned       Staging comments: Staged at breast conference 11.11.15    SUMMARY OF ONCOLOGIC HISTORY:   Breast cancer of upper-outer quadrant of right female breast   08/05/2014 Initial Diagnosis Invasive ductal carcinoma, 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, grade 3 ER 0%, PR 0%, HER-2 negative, Ki-67 90%: Right axillary lymph node positive for breast cancer   08/12/2014 Breast MRI 4.5x 7.5 x4 cm biopsy-proven right breast upper outer quadrant cancer with biopsy-proven level I right axillary lymph node with 2 level II right axillary lymph nodes identified   09/08/2014 -  Neo-Adjuvant Chemotherapy Dose dense Adriamycin and Cytoxan x4 followed by weekly Taxol and carboplatin x12    CHIEF COMPLIANT: Cycle 2 day 1 Adriamycin Cytoxan  INTERVAL HISTORY: Jamie Edwards is a 43 year old Caucasian with above-mentioned history of right-sided breast cancer currently on neoadjuvant chemotherapy with Adriamycin and Cytoxan. Today is cycle #2. She has been tolerating chemotherapy fairly well. She has alopecia related to chemotherapy as well as mild fatigue. Denies any fevers or chills. Denies any nausea or vomiting. Denies any mouth sores. Did have constipation for which she took Miralax and that helped with the stools.  REVIEW OF SYSTEMS:   Constitutional: Denies fevers, chills or abnormal weight loss Eyes: Denies blurriness of vision Ears, nose, mouth, throat, and face: Denies  mucositis or sore throat Respiratory: Denies cough, dyspnea or wheezes Cardiovascular: Denies palpitation, chest discomfort or lower extremity swelling Gastrointestinal:  Constipation Skin: Denies abnormal skin rashes Lymphatics: Denies new lymphadenopathy or easy bruising Neurological:Denies numbness, tingling or new weaknesses Behavioral/Psych: Mood is stable, no new changes  All other systems were reviewed with the patient and are negative.  I have reviewed the past medical history, past surgical history, social history and family history with the patient and they are unchanged from previous note.  ALLERGIES:  is allergic to penicillins.  MEDICATIONS:  Current Outpatient Prescriptions  Medication Sig Dispense Refill  . ALPRAZolam (XANAX) 0.25 MG tablet Take 0.25 mg by mouth 2 (two) times daily as needed for anxiety.    Marland Kitchen dexamethasone (DECADRON) 4 MG tablet Take 2 tablets by mouth once a day on the day after chemotherapy and then take 2 tablets two times a day for 2 days. Take with food. 30 tablet 1  . lidocaine-prilocaine (EMLA) cream Apply to affected area once 30 g 3  . LORazepam (ATIVAN) 0.5 MG tablet Take 1 tablet (0.5 mg total) by mouth every 6 (six) hours as needed (Nausea or vomiting). 30 tablet 0  . ondansetron (ZOFRAN) 8 MG tablet Take 1 tablet (8 mg total) by mouth 2 (two) times daily as needed. Start on the third day after chemotherapy. 30 tablet 1  . oxyCODONE-acetaminophen (ROXICET) 5-325 MG per tablet Take 1-2 tablets by mouth every 4 (four) hours as needed. 30 tablet 0  . prochlorperazine (COMPAZINE) 10 MG tablet Take 1 tablet (10 mg total) by mouth every 6 (six) hours as needed (Nausea or vomiting). 30 tablet 1   No current facility-administered medications for  this visit.    PHYSICAL EXAMINATION: ECOG PERFORMANCE STATUS: 1 - Symptomatic but completely ambulatory  Filed Vitals:   09/22/14 0810  BP: 102/63  Pulse: 81  Temp: 98.6 F (37 C)  Resp: 18   Filed  Weights   09/22/14 0810  Weight: 201 lb 14.4 oz (91.581 kg)    GENERAL:alert, no distress and comfortable SKIN: skin color, texture, turgor are normal, no rashes or significant lesions EYES: normal, Conjunctiva are pink and non-injected, sclera clear OROPHARYNX:no exudate, no erythema and lips, buccal mucosa, and tongue normal  NECK: supple, thyroid normal size, non-tender, without nodularity LYMPH:  no palpable lymphadenopathy in the cervical, axillary or inguinal LUNGS: clear to auscultation and percussion with normal breathing effort HEART: regular rate & rhythm and no murmurs and no lower extremity edema ABDOMEN:abdomen soft, non-tender and normal bowel sounds Musculoskeletal:no cyanosis of digits and no clubbing  NEURO: alert & oriented x 3 with fluent speech, no focal motor/sensory deficits  LABORATORY DATA:  I have reviewed the data as listed   Chemistry      Component Value Date/Time   NA 137 09/15/2014 0812   K 4.1 09/15/2014 0812   CO2 30* 09/15/2014 0812   BUN 11.1 09/15/2014 0812   CREATININE 0.8 09/15/2014 0812      Component Value Date/Time   CALCIUM 9.6 09/15/2014 0812   ALKPHOS 102 09/15/2014 0812   AST 18 09/15/2014 0812   ALT 25 09/15/2014 0812   BILITOT 0.72 09/15/2014 0812       Lab Results  Component Value Date   WBC 2.0* 09/15/2014   HGB 10.9* 09/15/2014   HCT 34.5* 09/15/2014   MCV 85.1 09/15/2014   PLT 131* 09/15/2014   NEUTROABS 1.5 09/15/2014   ASSESSMENT & PLAN:  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 Today is cycle 2 of AC  Toxicities of chemotherapy: 1. Chemotherapy-induced  neutropenia 2. Fatigue 3. Constipation: I instructed her to take Colace daily and take Miralax only as needed. 4. Chemotherapy-induced anemia: Will be closely monitored. 5. Alopecia  Awaiting lab work from today. Monitoring closely for chemotherapy toxicities. Return to clinic in 2 weeks for cycle 3.    Orders Placed This Encounter  Procedures  . CBC with Differential    Standing Status: Future     Number of Occurrences:      Standing Expiration Date: 09/22/2015  . Comprehensive metabolic panel (Cmet) - [facility]    Standing Status: Future     Number of Occurrences:      Standing Expiration Date: 09/22/2015   The patient has a good understanding of the overall plan. she agrees with it. She will call with any problems that may develop before her next visit here.   Rulon Eisenmenger, MD 09/22/2014 8:52 AM

## 2014-09-22 NOTE — Assessment & Plan Note (Addendum)
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 Today is cycle 2 of AC  Toxicities of chemotherapy: 1. Chemotherapy-induced neutropenia 2. Fatigue 3. Constipation: I instructed her to take Colace daily and take Miralax only as needed. 4. Chemotherapy-induced anemia: Will be closely monitored. 5. Alopecia  Awaiting lab work from today. Monitoring closely for chemotherapy toxicities. Return to clinic in 2 weeks for cycle 3.

## 2014-09-22 NOTE — Telephone Encounter (Signed)
Per staff message and POF I have scheduled appts. Advised scheduler of appts. JMW  

## 2014-09-22 NOTE — Telephone Encounter (Signed)
, °

## 2014-09-22 NOTE — Patient Instructions (Signed)
[facility] Discharge Instructions for Patients Receiving Chemotherapy  Today you received the following chemotherapy agents adriamycin/cytoxan  To help prevent nausea and vomiting after your treatment, we encourage you to take your nausea medication as directed  If you develop nausea and vomiting that is not controlled by your nausea medication, call the clinic.   BELOW ARE SYMPTOMS THAT SHOULD BE REPORTED IMMEDIATELY:  *FEVER GREATER THAN 100.5 F  *CHILLS WITH OR WITHOUT FEVER  NAUSEA AND VOMITING THAT IS NOT CONTROLLED WITH YOUR NAUSEA MEDICATION  *UNUSUAL SHORTNESS OF BREATH  *UNUSUAL BRUISING OR BLEEDING  TENDERNESS IN MOUTH AND THROAT WITH OR WITHOUT PRESENCE OF ULCERS  *URINARY PROBLEMS  *BOWEL PROBLEMS  UNUSUAL RASH Items with * indicate a potential emergency and should be followed up as soon as possible.  Feel free to call the clinic you have any questions or concerns. The clinic phone number is [phone].  

## 2014-09-23 ENCOUNTER — Ambulatory Visit (HOSPITAL_BASED_OUTPATIENT_CLINIC_OR_DEPARTMENT_OTHER): Payer: 59

## 2014-09-23 DIAGNOSIS — C50411 Malignant neoplasm of upper-outer quadrant of right female breast: Secondary | ICD-10-CM

## 2014-09-23 DIAGNOSIS — Z5189 Encounter for other specified aftercare: Secondary | ICD-10-CM

## 2014-09-23 MED ORDER — PEGFILGRASTIM INJECTION 6 MG/0.6ML ~~LOC~~
6.0000 mg | PREFILLED_SYRINGE | Freq: Once | SUBCUTANEOUS | Status: AC
  Administered 2014-09-23: 6 mg via SUBCUTANEOUS
  Filled 2014-09-23: qty 0.6

## 2014-09-23 NOTE — Patient Instructions (Signed)
Pegfilgrastim injection What is this medicine? PEGFILGRASTIM (peg fil GRA stim) is a long-acting granulocyte colony-stimulating factor that stimulates the growth of neutrophils, a type of white blood cell important in the body's fight against infection. It is used to reduce the incidence of fever and infection in patients with certain types of cancer who are receiving chemotherapy that affects the bone marrow. This medicine may be used for other purposes; ask your health care provider or pharmacist if you have questions. COMMON BRAND NAME(S): Neulasta What should I tell my health care provider before I take this medicine? They need to know if you have any of these conditions: -latex allergy -ongoing radiation therapy -sickle cell disease -skin reactions to acrylic adhesives (On-Body Injector only) -an unusual or allergic reaction to pegfilgrastim, filgrastim, other medicines, foods, dyes, or preservatives -pregnant or trying to get pregnant -breast-feeding How should I use this medicine? This medicine is for injection under the skin. If you get this medicine at home, you will be taught how to prepare and give the pre-filled syringe or how to use the On-body Injector. Refer to the patient Instructions for Use for detailed instructions. Use exactly as directed. Take your medicine at regular intervals. Do not take your medicine more often than directed. It is important that you put your used needles and syringes in a special sharps container. Do not put them in a trash can. If you do not have a sharps container, call your pharmacist or healthcare provider to get one. Talk to your pediatrician regarding the use of this medicine in children. Special care may be needed. Overdosage: If you think you have taken too much of this medicine contact a poison control center or emergency room at once. NOTE: This medicine is only for you. Do not share this medicine with others. What if I miss a dose? It is  important not to miss your dose. Call your doctor or health care professional if you miss your dose. If you miss a dose due to an On-body Injector failure or leakage, a new dose should be administered as soon as possible using a single prefilled syringe for manual use. What may interact with this medicine? Interactions have not been studied. Give your health care provider a list of all the medicines, herbs, non-prescription drugs, or dietary supplements you use. Also tell them if you smoke, drink alcohol, or use illegal drugs. Some items may interact with your medicine. This list may not describe all possible interactions. Give your health care provider a list of all the medicines, herbs, non-prescription drugs, or dietary supplements you use. Also tell them if you smoke, drink alcohol, or use illegal drugs. Some items may interact with your medicine. What should I watch for while using this medicine? You may need blood work done while you are taking this medicine. If you are going to need a MRI, CT scan, or other procedure, tell your doctor that you are using this medicine (On-Body Injector only). What side effects may I notice from receiving this medicine? Side effects that you should report to your doctor or health care professional as soon as possible: -allergic reactions like skin rash, itching or hives, swelling of the face, lips, or tongue -dizziness -fever -pain, redness, or irritation at site where injected -pinpoint red spots on the skin -shortness of breath or breathing problems -stomach or side pain, or pain at the shoulder -swelling -tiredness -trouble passing urine Side effects that usually do not require medical attention (report to your doctor   or health care professional if they continue or are bothersome): -bone pain -muscle pain This list may not describe all possible side effects. Call your doctor for medical advice about side effects. You may report side effects to FDA at  1-800-FDA-1088. Where should I keep my medicine? Keep out of the reach of children. Store pre-filled syringes in a refrigerator between 2 and 8 degrees C (36 and 46 degrees F). Do not freeze. Keep in carton to protect from light. Throw away this medicine if it is left out of the refrigerator for more than 48 hours. Throw away any unused medicine after the expiration date. NOTE: This sheet is a summary. It may not cover all possible information. If you have questions about this medicine, talk to your doctor, pharmacist, or health care provider.  2015, [employer]vier/Gold Standard. (2013-12-23 16:14:05)  

## 2014-10-05 ENCOUNTER — Other Ambulatory Visit (HOSPITAL_BASED_OUTPATIENT_CLINIC_OR_DEPARTMENT_OTHER): Payer: 59

## 2014-10-05 ENCOUNTER — Ambulatory Visit (HOSPITAL_BASED_OUTPATIENT_CLINIC_OR_DEPARTMENT_OTHER): Payer: 59

## 2014-10-05 ENCOUNTER — Ambulatory Visit (HOSPITAL_BASED_OUTPATIENT_CLINIC_OR_DEPARTMENT_OTHER): Payer: 59 | Admitting: Nurse Practitioner

## 2014-10-05 ENCOUNTER — Telehealth: Payer: Self-pay | Admitting: Nurse Practitioner

## 2014-10-05 ENCOUNTER — Encounter: Payer: Self-pay | Admitting: Nurse Practitioner

## 2014-10-05 VITALS — BP 107/73 | HR 92 | Temp 98.3°F | Resp 18 | Ht 67.0 in | Wt 201.0 lb

## 2014-10-05 DIAGNOSIS — C50411 Malignant neoplasm of upper-outer quadrant of right female breast: Secondary | ICD-10-CM

## 2014-10-05 DIAGNOSIS — K5900 Constipation, unspecified: Secondary | ICD-10-CM | POA: Insufficient documentation

## 2014-10-05 DIAGNOSIS — C773 Secondary and unspecified malignant neoplasm of axilla and upper limb lymph nodes: Secondary | ICD-10-CM

## 2014-10-05 DIAGNOSIS — Z5111 Encounter for antineoplastic chemotherapy: Secondary | ICD-10-CM

## 2014-10-05 DIAGNOSIS — Z171 Estrogen receptor negative status [ER-]: Secondary | ICD-10-CM

## 2014-10-05 LAB — CBC WITH DIFFERENTIAL/PLATELET
BASO%: 0.4 % (ref 0.0–2.0)
Basophils Absolute: 0 10*3/uL (ref 0.0–0.1)
EOS%: 0 % (ref 0.0–7.0)
Eosinophils Absolute: 0 10*3/uL (ref 0.0–0.5)
HCT: 33.3 % — ABNORMAL LOW (ref 34.8–46.6)
HEMOGLOBIN: 10.7 g/dL — AB (ref 11.6–15.9)
LYMPH#: 1.4 10*3/uL (ref 0.9–3.3)
LYMPH%: 12.8 % — ABNORMAL LOW (ref 14.0–49.7)
MCH: 27.5 pg (ref 25.1–34.0)
MCHC: 32.1 g/dL (ref 31.5–36.0)
MCV: 85.6 fL (ref 79.5–101.0)
MONO#: 0.6 10*3/uL (ref 0.1–0.9)
MONO%: 5.3 % (ref 0.0–14.0)
NEUT%: 81.5 % — ABNORMAL HIGH (ref 38.4–76.8)
NEUTROS ABS: 8.7 10*3/uL — AB (ref 1.5–6.5)
Platelets: 214 10*3/uL (ref 145–400)
RBC: 3.89 10*6/uL (ref 3.70–5.45)
RDW: 14.9 % — AB (ref 11.2–14.5)
WBC: 10.7 10*3/uL — AB (ref 3.9–10.3)

## 2014-10-05 LAB — COMPREHENSIVE METABOLIC PANEL (CC13)
ALK PHOS: 106 U/L (ref 40–150)
ALT: 32 U/L (ref 0–55)
AST: 24 U/L (ref 5–34)
Albumin: 3.2 g/dL — ABNORMAL LOW (ref 3.5–5.0)
Anion Gap: 7 mEq/L (ref 3–11)
BUN: 7.4 mg/dL (ref 7.0–26.0)
CO2: 33 mEq/L — ABNORMAL HIGH (ref 22–29)
Calcium: 9.4 mg/dL (ref 8.4–10.4)
Chloride: 101 mEq/L (ref 98–109)
Creatinine: 0.8 mg/dL (ref 0.6–1.1)
GLUCOSE: 132 mg/dL (ref 70–140)
POTASSIUM: 4.1 meq/L (ref 3.5–5.1)
SODIUM: 141 meq/L (ref 136–145)
Total Bilirubin: <0.2 mg/dL (ref 0.20–1.20)
Total Protein: 6.8 g/dL (ref 6.4–8.3)

## 2014-10-05 MED ORDER — DEXAMETHASONE SODIUM PHOSPHATE 20 MG/5ML IJ SOLN
INTRAMUSCULAR | Status: AC
  Filled 2014-10-05: qty 5

## 2014-10-05 MED ORDER — SODIUM CHLORIDE 0.9 % IV SOLN
600.0000 mg/m2 | Freq: Once | INTRAVENOUS | Status: AC
  Administered 2014-10-05: 1260 mg via INTRAVENOUS
  Filled 2014-10-05: qty 63

## 2014-10-05 MED ORDER — SODIUM CHLORIDE 0.9 % IJ SOLN
10.0000 mL | INTRAMUSCULAR | Status: DC | PRN
  Administered 2014-10-05: 10 mL
  Filled 2014-10-05: qty 10

## 2014-10-05 MED ORDER — SODIUM CHLORIDE 0.9 % IV SOLN
Freq: Once | INTRAVENOUS | Status: AC
  Administered 2014-10-05: 16:00:00 via INTRAVENOUS

## 2014-10-05 MED ORDER — PALONOSETRON HCL INJECTION 0.25 MG/5ML
0.2500 mg | Freq: Once | INTRAVENOUS | Status: AC
  Administered 2014-10-05: 0.25 mg via INTRAVENOUS

## 2014-10-05 MED ORDER — HEPARIN SOD (PORK) LOCK FLUSH 100 UNIT/ML IV SOLN
500.0000 [IU] | Freq: Once | INTRAVENOUS | Status: AC | PRN
  Administered 2014-10-05: 500 [IU]
  Filled 2014-10-05: qty 5

## 2014-10-05 MED ORDER — DOXORUBICIN HCL CHEMO IV INJECTION 2 MG/ML
60.0000 mg/m2 | Freq: Once | INTRAVENOUS | Status: AC
  Administered 2014-10-05: 126 mg via INTRAVENOUS
  Filled 2014-10-05: qty 63

## 2014-10-05 MED ORDER — DEXAMETHASONE SODIUM PHOSPHATE 20 MG/5ML IJ SOLN
12.0000 mg | Freq: Once | INTRAMUSCULAR | Status: AC
  Administered 2014-10-05: 12 mg via INTRAVENOUS

## 2014-10-05 MED ORDER — SODIUM CHLORIDE 0.9 % IV SOLN
150.0000 mg | Freq: Once | INTRAVENOUS | Status: AC
  Administered 2014-10-05: 150 mg via INTRAVENOUS
  Filled 2014-10-05: qty 5

## 2014-10-05 MED ORDER — PALONOSETRON HCL INJECTION 0.25 MG/5ML
INTRAVENOUS | Status: AC
  Filled 2014-10-05: qty 5

## 2014-10-05 NOTE — Assessment & Plan Note (Signed)
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 Today is cycle 3 of AC.  Jamie Edwards is doing well today. The labs were reviewed in detail and were entirely stable. Her hgb is down to 10.7 but she is asymptomatic at this time. We will continue to monitor this value.  She plans to use stool softeners for her constipation this week.  For her sinus drainage pressure I suggested she use any OTC cold/sinus med. Claritin and sudafed work well.   Jamie Edwards will proceed with cycle 3 of AC today. She will return in 2 weeks for labs and her 4th and final cycle.

## 2014-10-05 NOTE — Progress Notes (Signed)
Patient Care Team: Kelton Pillar, MD as PCP - General (Family Medicine) Rulon Eisenmenger, MD as Consulting Physician (Hematology and Oncology) Excell Seltzer, MD as Consulting Physician (General Surgery) Eppie Gibson, MD as Attending Physician (Radiation Oncology) Trinda Pascal, NP as Nurse Practitioner (Nurse Practitioner)  DIAGNOSIS: Breast cancer of upper-outer quadrant of right female breast   Staging form: Breast, AJCC 7th Edition     Clinical: Stage IIIA (T3, N1, M0) - Unsigned       Staging comments: Staged at breast conference 11.11.15    SUMMARY OF ONCOLOGIC HISTORY:   Breast cancer of upper-outer quadrant of right female breast   08/05/2014 Initial Diagnosis Invasive ductal carcinoma, 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, grade 3 ER 0%, PR 0%, HER-2 negative, Ki-67 90%: Right axillary lymph node positive for breast cancer   08/12/2014 Breast MRI 4.5x 7.5 x4 cm biopsy-proven right breast upper outer quadrant cancer with biopsy-proven level I right axillary lymph node with 2 level II right axillary lymph nodes identified   09/08/2014 -  Neo-Adjuvant Chemotherapy Dose dense Adriamycin and Cytoxan x4 followed by weekly Taxol and carboplatin x12    CHIEF COMPLIANT: Cycle 3 day 1 Adriamycin Cytoxan  INTERVAL HISTORY: Jamie Edwards is a 43 year old Caucasian with above-mentioned history of right-sided breast cancer currently on neoadjuvant chemotherapy with Adriamycin and Cytoxan. Today is cycle #3. She has been tolerating chemotherapy fairly well. She denies fever or chills. The nausea she does have is immediately solved with compazine. She has some constipation acutely after treatment but plans to use stool softeners with this round. Her appetite can be decreased but she makes herself eat. She denies mouth sores or rashes. Her energy level is up and down. The steroids keep her up and the lorazepam only works for half of the night. She  continues to work from home about 3 days a week. She is batting some sinus drainage this week. Her nailbeds are hyperpigmented.   REVIEW OF SYSTEMS:   All other systems were reviewed with the patient and are negative.  I have reviewed the past medical history, past surgical history, social history and family history with the patient and they are unchanged from previous note.  ALLERGIES:  is allergic to penicillins.  MEDICATIONS:  Current Outpatient Prescriptions  Medication Sig Dispense Refill  . ALPRAZolam (XANAX) 0.25 MG tablet Take 0.25 mg by mouth 2 (two) times daily as needed for anxiety.    Marland Kitchen dexamethasone (DECADRON) 4 MG tablet Take 2 tablets by mouth once a day on the day after chemotherapy and then take 2 tablets two times a day for 2 days. Take with food. 30 tablet 1  . lidocaine-prilocaine (EMLA) cream Apply to affected area once 30 g 3  . LORazepam (ATIVAN) 0.5 MG tablet Take 1 tablet (0.5 mg total) by mouth every 6 (six) hours as needed (Nausea or vomiting). 30 tablet 0  . ondansetron (ZOFRAN) 8 MG tablet Take 1 tablet (8 mg total) by mouth 2 (two) times daily as needed. Start on the third day after chemotherapy. 30 tablet 1  . prochlorperazine (COMPAZINE) 10 MG tablet Take 1 tablet (10 mg total) by mouth every 6 (six) hours as needed (Nausea or vomiting). 30 tablet 1  . oxyCODONE-acetaminophen (ROXICET) 5-325 MG per tablet Take 1-2 tablets by mouth every 4 (four) hours as needed. (Patient not taking: Reported on 10/05/2014) 30 tablet 0   No current facility-administered medications for this visit.   Facility-Administered Medications Ordered in  Other Visits  Medication Dose Route Frequency Provider Last Rate Last Dose  . 0.9 %  sodium chloride infusion   Intravenous Once Rulon Eisenmenger, MD      . cyclophosphamide (CYTOXAN) 1,260 mg in sodium chloride 0.9 % 250 mL chemo infusion  600 mg/m2 (Treatment Plan Actual) Intravenous Once Rulon Eisenmenger, MD      . Dexamethasone Sodium  Phosphate (DECADRON) injection 12 mg  12 mg Intravenous Once Rulon Eisenmenger, MD      . DOXOrubicin (ADRIAMYCIN) chemo injection 126 mg  60 mg/m2 (Treatment Plan Actual) Intravenous Once Rulon Eisenmenger, MD      . fosaprepitant (EMEND) 150 mg in sodium chloride 0.9 % 145 mL IVPB  150 mg Intravenous Once Rulon Eisenmenger, MD      . heparin lock flush 100 unit/mL  500 Units Intracatheter Once PRN Rulon Eisenmenger, MD      . palonosetron (ALOXI) injection 0.25 mg  0.25 mg Intravenous Once Rulon Eisenmenger, MD      . sodium chloride 0.9 % injection 10 mL  10 mL Intracatheter PRN Rulon Eisenmenger, MD        PHYSICAL EXAMINATION: ECOG PERFORMANCE STATUS: 1 - Symptomatic but completely ambulatory  Filed Vitals:   10/05/14 1448  BP: 107/73  Pulse: 92  Temp: 98.3 F (36.8 C)  Resp: 18   Filed Weights   10/05/14 1448  Weight: 201 lb (91.173 kg)    Skin: warm, dry  HEENT: sclerae anicteric, conjunctivae pink, oropharynx clear. No thrush or mucositis.  Lymph Nodes: No cervical or supraclavicular lymphadenopathy  Lungs: clear to auscultation bilaterally, no rales, wheezes, or rhonci  Heart: regular rate and rhythm  Abdomen: round, soft, non tender, positive bowel sounds  Musculoskeletal: No focal spinal tenderness, no peripheral edema  Neuro: non focal, well oriented, positive affect  Breasts: deferred  LABORATORY DATA:  I have reviewed the data as listed   Chemistry      Component Value Date/Time   NA 141 10/05/2014 1438   K 4.1 10/05/2014 1438   CO2 33* 10/05/2014 1438   BUN 7.4 10/05/2014 1438   CREATININE 0.8 10/05/2014 1438      Component Value Date/Time   CALCIUM 9.4 10/05/2014 1438   ALKPHOS 106 10/05/2014 1438   AST 24 10/05/2014 1438   ALT 32 10/05/2014 1438   BILITOT <0.20 10/05/2014 1438       Lab Results  Component Value Date   WBC 10.7* 10/05/2014   HGB 10.7* 10/05/2014   HCT 33.3* 10/05/2014   MCV 85.6 10/05/2014   PLT 214 10/05/2014   NEUTROABS 8.7* 10/05/2014    ASSESSMENT & PLAN:  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 Today is cycle 3 of AC.  Jeilyn is doing well today. The labs were reviewed in detail and were entirely stable. Her hgb is down to 10.7 but she is asymptomatic at this time. We will continue to monitor this value.  She plans to use stool softeners for her constipation this week.  For her sinus drainage pressure I suggested she use any OTC cold/sinus med. Claritin and sudafed work well.   Adiba will  proceed with cycle 3 of AC today. She will return in 2 weeks for labs and her 4th and final cycle.     No orders of the defined types were placed in this encounter.   The patient has a good understanding of the overall plan. she agrees with it. She will call with any problems that may develop before her next visit here.   Marcelino Duster, NP 10/05/2014 4:18 PM

## 2014-10-05 NOTE — Telephone Encounter (Signed)
, °

## 2014-10-05 NOTE — Patient Instructions (Signed)
[facility] Discharge Instructions for Patients Receiving Chemotherapy  Today you received the following chemotherapy agents Adriamycin and Cytoxan.  To help prevent nausea and vomiting after your treatment, we encourage you to take your nausea medication as prescribed.   If you develop nausea and vomiting that is not controlled by your nausea medication, call the clinic.   BELOW ARE SYMPTOMS THAT SHOULD BE REPORTED IMMEDIATELY:  *FEVER GREATER THAN 100.5 F  *CHILLS WITH OR WITHOUT FEVER  NAUSEA AND VOMITING THAT IS NOT CONTROLLED WITH YOUR NAUSEA MEDICATION  *UNUSUAL SHORTNESS OF BREATH  *UNUSUAL BRUISING OR BLEEDING  TENDERNESS IN MOUTH AND THROAT WITH OR WITHOUT PRESENCE OF ULCERS  *URINARY PROBLEMS  *BOWEL PROBLEMS  UNUSUAL RASH Items with * indicate a potential emergency and should be followed up as soon as possible.  Feel free to call the clinic you have any questions or concerns. The clinic phone number is [phone].    

## 2014-10-08 ENCOUNTER — Ambulatory Visit (HOSPITAL_BASED_OUTPATIENT_CLINIC_OR_DEPARTMENT_OTHER): Payer: 59

## 2014-10-08 DIAGNOSIS — C50411 Malignant neoplasm of upper-outer quadrant of right female breast: Secondary | ICD-10-CM

## 2014-10-08 DIAGNOSIS — Z5189 Encounter for other specified aftercare: Secondary | ICD-10-CM

## 2014-10-08 MED ORDER — PEGFILGRASTIM INJECTION 6 MG/0.6ML ~~LOC~~
6.0000 mg | PREFILLED_SYRINGE | Freq: Once | SUBCUTANEOUS | Status: AC
  Administered 2014-10-08: 6 mg via SUBCUTANEOUS

## 2014-10-08 NOTE — Patient Instructions (Signed)
Pegfilgrastim injection What is this medicine? PEGFILGRASTIM (peg fil GRA stim) is a long-acting granulocyte colony-stimulating factor that stimulates the growth of neutrophils, a type of white blood cell important in the body's fight against infection. It is used to reduce the incidence of fever and infection in patients with certain types of cancer who are receiving chemotherapy that affects the bone marrow. This medicine may be used for other purposes; ask your health care provider or pharmacist if you have questions. COMMON BRAND NAME(S): Neulasta What should I tell my health care provider before I take this medicine? They need to know if you have any of these conditions: -latex allergy -ongoing radiation therapy -sickle cell disease -skin reactions to acrylic adhesives (On-Body Injector only) -an unusual or allergic reaction to pegfilgrastim, filgrastim, other medicines, foods, dyes, or preservatives -pregnant or trying to get pregnant -breast-feeding How should I use this medicine? This medicine is for injection under the skin. If you get this medicine at home, you will be taught how to prepare and give the pre-filled syringe or how to use the On-body Injector. Refer to the patient Instructions for Use for detailed instructions. Use exactly as directed. Take your medicine at regular intervals. Do not take your medicine more often than directed. It is important that you put your used needles and syringes in a special sharps container. Do not put them in a trash can. If you do not have a sharps container, call your pharmacist or healthcare provider to get one. Talk to your pediatrician regarding the use of this medicine in children. Special care may be needed. Overdosage: If you think you have taken too much of this medicine contact a poison control center or emergency room at once. NOTE: This medicine is only for you. Do not share this medicine with others. What if I miss a dose? It is  important not to miss your dose. Call your doctor or health care professional if you miss your dose. If you miss a dose due to an On-body Injector failure or leakage, a new dose should be administered as soon as possible using a single prefilled syringe for manual use. What may interact with this medicine? Interactions have not been studied. Give your health care provider a list of all the medicines, herbs, non-prescription drugs, or dietary supplements you use. Also tell them if you smoke, drink alcohol, or use illegal drugs. Some items may interact with your medicine. This list may not describe all possible interactions. Give your health care provider a list of all the medicines, herbs, non-prescription drugs, or dietary supplements you use. Also tell them if you smoke, drink alcohol, or use illegal drugs. Some items may interact with your medicine. What should I watch for while using this medicine? You may need blood work done while you are taking this medicine. If you are going to need a MRI, CT scan, or other procedure, tell your doctor that you are using this medicine (On-Body Injector only). What side effects may I notice from receiving this medicine? Side effects that you should report to your doctor or health care professional as soon as possible: -allergic reactions like skin rash, itching or hives, swelling of the face, lips, or tongue -dizziness -fever -pain, redness, or irritation at site where injected -pinpoint red spots on the skin -shortness of breath or breathing problems -stomach or side pain, or pain at the shoulder -swelling -tiredness -trouble passing urine Side effects that usually do not require medical attention (report to your doctor   or health care professional if they continue or are bothersome): -bone pain -muscle pain This list may not describe all possible side effects. Call your doctor for medical advice about side effects. You may report side effects to FDA at  1-800-FDA-1088. Where should I keep my medicine? Keep out of the reach of children. Store pre-filled syringes in a refrigerator between 2 and 8 degrees C (36 and 46 degrees F). Do not freeze. Keep in carton to protect from light. Throw away this medicine if it is left out of the refrigerator for more than 48 hours. Throw away any unused medicine after the expiration date. NOTE: This sheet is a summary. It may not cover all possible information. If you have questions about this medicine, talk to your doctor, pharmacist, or health care provider.  2015, [employer]vier/Gold Standard. (2013-12-23 16:14:05)  

## 2014-10-10 ENCOUNTER — Telehealth: Payer: Self-pay | Admitting: *Deleted

## 2014-10-10 NOTE — Telephone Encounter (Signed)
Per staff message and POF I have scheduled appts. Advised scheduler of appts. JMW  

## 2014-10-11 ENCOUNTER — Telehealth: Payer: Self-pay | Admitting: Genetic Counselor

## 2014-10-11 ENCOUNTER — Encounter: Payer: Self-pay | Admitting: Genetic Counselor

## 2014-10-11 DIAGNOSIS — Z1589 Genetic susceptibility to other disease: Secondary | ICD-10-CM

## 2014-10-11 DIAGNOSIS — Z1379 Encounter for other screening for genetic and chromosomal anomalies: Secondary | ICD-10-CM | POA: Insufficient documentation

## 2014-10-11 DIAGNOSIS — Z1501 Genetic susceptibility to malignant neoplasm of breast: Secondary | ICD-10-CM | POA: Insufficient documentation

## 2014-10-11 DIAGNOSIS — Z1509 Genetic susceptibility to other malignant neoplasm: Secondary | ICD-10-CM

## 2014-10-11 NOTE — Telephone Encounter (Signed)
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  She will come in on 10/20/14 at 8:30 AM, prior to seeing Dr. Lindi Adie and having chemotherapy, for genetic counseling.

## 2014-10-11 NOTE — Telephone Encounter (Signed)
LM on VM asking that she call back to get genetic test results.

## 2014-10-13 ENCOUNTER — Telehealth: Payer: Self-pay | Admitting: *Deleted

## 2014-10-13 NOTE — Telephone Encounter (Signed)
Pt called with complaints of a dry, nonproductive cough and nasal congestion. Pt denies any fever. Suggested pt take OTC Mucinex formula for nasal congestion and cough and use a humidifier to put some moisture in the air since she said she had seen a little dried blood in her nares. Pt said the drainage she has seen from nares is mostly clear. Told pt to take medication as instructed on package every 12 hours x 5 days and to call us @ [phone] if she should start having any fever. Pt verbalized understanding . Message to be forwarded to [facility].

## 2014-10-20 ENCOUNTER — Ambulatory Visit (HOSPITAL_BASED_OUTPATIENT_CLINIC_OR_DEPARTMENT_OTHER): Payer: 59

## 2014-10-20 ENCOUNTER — Other Ambulatory Visit (HOSPITAL_BASED_OUTPATIENT_CLINIC_OR_DEPARTMENT_OTHER): Payer: 59

## 2014-10-20 ENCOUNTER — Ambulatory Visit (HOSPITAL_BASED_OUTPATIENT_CLINIC_OR_DEPARTMENT_OTHER): Payer: 59 | Admitting: Hematology and Oncology

## 2014-10-20 ENCOUNTER — Ambulatory Visit: Payer: 59 | Admitting: Genetic Counselor

## 2014-10-20 VITALS — BP 113/66 | HR 88 | Temp 98.9°F | Resp 19 | Ht 67.0 in | Wt 202.8 lb

## 2014-10-20 DIAGNOSIS — Z1509 Genetic susceptibility to other malignant neoplasm: Principal | ICD-10-CM

## 2014-10-20 DIAGNOSIS — C50411 Malignant neoplasm of upper-outer quadrant of right female breast: Secondary | ICD-10-CM

## 2014-10-20 DIAGNOSIS — C773 Secondary and unspecified malignant neoplasm of axilla and upper limb lymph nodes: Secondary | ICD-10-CM

## 2014-10-20 DIAGNOSIS — Z1502 Genetic susceptibility to malignant neoplasm of ovary: Secondary | ICD-10-CM

## 2014-10-20 DIAGNOSIS — Z1501 Genetic susceptibility to malignant neoplasm of breast: Secondary | ICD-10-CM

## 2014-10-20 DIAGNOSIS — D6481 Anemia due to antineoplastic chemotherapy: Secondary | ICD-10-CM

## 2014-10-20 DIAGNOSIS — Z1589 Genetic susceptibility to other disease: Principal | ICD-10-CM

## 2014-10-20 DIAGNOSIS — Z452 Encounter for adjustment and management of vascular access device: Secondary | ICD-10-CM

## 2014-10-20 DIAGNOSIS — Z5111 Encounter for antineoplastic chemotherapy: Secondary | ICD-10-CM

## 2014-10-20 DIAGNOSIS — C50919 Malignant neoplasm of unspecified site of unspecified female breast: Secondary | ICD-10-CM

## 2014-10-20 LAB — CBC WITH DIFFERENTIAL/PLATELET
BASO%: 0.5 % (ref 0.0–2.0)
BASOS ABS: 0.1 10*3/uL (ref 0.0–0.1)
EOS ABS: 0 10*3/uL (ref 0.0–0.5)
EOS%: 0 % (ref 0.0–7.0)
HCT: 33.7 % — ABNORMAL LOW (ref 34.8–46.6)
HEMOGLOBIN: 10.6 g/dL — AB (ref 11.6–15.9)
LYMPH#: 1 10*3/uL (ref 0.9–3.3)
LYMPH%: 9.7 % — ABNORMAL LOW (ref 14.0–49.7)
MCH: 27.1 pg (ref 25.1–34.0)
MCHC: 31.5 g/dL (ref 31.5–36.0)
MCV: 85.9 fL (ref 79.5–101.0)
MONO#: 0.7 10*3/uL (ref 0.1–0.9)
MONO%: 6.8 % (ref 0.0–14.0)
NEUT#: 8.6 10*3/uL — ABNORMAL HIGH (ref 1.5–6.5)
NEUT%: 83 % — AB (ref 38.4–76.8)
PLATELETS: 328 10*3/uL (ref 145–400)
RBC: 3.93 10*6/uL (ref 3.70–5.45)
RDW: 15.9 % — ABNORMAL HIGH (ref 11.2–14.5)
WBC: 10.4 10*3/uL — AB (ref 3.9–10.3)

## 2014-10-20 LAB — COMPREHENSIVE METABOLIC PANEL (CC13)
ALBUMIN: 3.4 g/dL — AB (ref 3.5–5.0)
ALK PHOS: 106 U/L (ref 40–150)
ALT: 17 U/L (ref 0–55)
AST: 16 U/L (ref 5–34)
Anion Gap: 9 mEq/L (ref 3–11)
BUN: 8.3 mg/dL (ref 7.0–26.0)
CALCIUM: 9.5 mg/dL (ref 8.4–10.4)
CO2: 33 mEq/L — ABNORMAL HIGH (ref 22–29)
CREATININE: 0.8 mg/dL (ref 0.6–1.1)
Chloride: 101 mEq/L (ref 98–109)
EGFR: >90 mL/min/{1.73_m2} (ref >90)
GLUCOSE: 96 mg/dL (ref 70–140)
POTASSIUM: 3.9 meq/L (ref 3.5–5.1)
SODIUM: 142 meq/L (ref 136–145)
Total Protein: 6.9 g/dL (ref 6.4–8.3)

## 2014-10-20 MED ORDER — SODIUM CHLORIDE 0.9 % IJ SOLN
10.0000 mL | INTRAMUSCULAR | Status: DC | PRN
  Administered 2014-10-20: 10 mL
  Filled 2014-10-20: qty 10

## 2014-10-20 MED ORDER — PALONOSETRON HCL INJECTION 0.25 MG/5ML
INTRAVENOUS | Status: AC
  Filled 2014-10-20: qty 5

## 2014-10-20 MED ORDER — DEXAMETHASONE SODIUM PHOSPHATE 20 MG/5ML IJ SOLN
12.0000 mg | Freq: Once | INTRAMUSCULAR | Status: AC
  Administered 2014-10-20: 12 mg via INTRAVENOUS

## 2014-10-20 MED ORDER — SODIUM CHLORIDE 0.9 % IV SOLN
600.0000 mg/m2 | Freq: Once | INTRAVENOUS | Status: AC
  Administered 2014-10-20: 1260 mg via INTRAVENOUS
  Filled 2014-10-20: qty 63

## 2014-10-20 MED ORDER — SODIUM CHLORIDE 0.9 % IV SOLN
Freq: Once | INTRAVENOUS | Status: AC
  Administered 2014-10-20: 11:00:00 via INTRAVENOUS

## 2014-10-20 MED ORDER — ALTEPLASE 2 MG IJ SOLR
2.0000 mg | Freq: Once | INTRAMUSCULAR | Status: AC | PRN
  Administered 2014-10-20: 2 mg
  Filled 2014-10-20: qty 2

## 2014-10-20 MED ORDER — SODIUM CHLORIDE 0.9 % IV SOLN
150.0000 mg | Freq: Once | INTRAVENOUS | Status: AC
  Administered 2014-10-20: 150 mg via INTRAVENOUS
  Filled 2014-10-20: qty 5

## 2014-10-20 MED ORDER — PALONOSETRON HCL INJECTION 0.25 MG/5ML
0.2500 mg | Freq: Once | INTRAVENOUS | Status: AC
  Administered 2014-10-20: 0.25 mg via INTRAVENOUS

## 2014-10-20 MED ORDER — HEPARIN SOD (PORK) LOCK FLUSH 100 UNIT/ML IV SOLN
500.0000 [IU] | Freq: Once | INTRAVENOUS | Status: AC | PRN
  Administered 2014-10-20: 500 [IU]
  Filled 2014-10-20: qty 5

## 2014-10-20 MED ORDER — DOXORUBICIN HCL CHEMO IV INJECTION 2 MG/ML
60.0000 mg/m2 | Freq: Once | INTRAVENOUS | Status: AC
  Administered 2014-10-20: 126 mg via INTRAVENOUS
  Filled 2014-10-20: qty 63

## 2014-10-20 MED ORDER — DEXAMETHASONE SODIUM PHOSPHATE 20 MG/5ML IJ SOLN
INTRAMUSCULAR | Status: AC
  Filled 2014-10-20: qty 5

## 2014-10-20 NOTE — Assessment & Plan Note (Addendum)
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 Today is cycle 4 of AC.  Toxicities of chemotherapy: 1. Alopecia 2. Chemotherapy-induced anemia grade 1 3. Sinus pressure: On over-the-counter cold and sinus medications Monitoring closely for toxicities  PALB2 Mutation:  The absolute breast-cancer risk for PALB2 female mutation carriers by 43 years of age ranged from 33% for those with no family history of breast cancer to 58% for those with two or more first-degree relatives with breast cancer at 43 years of age. Based on NCCN guidelines, Breast MRI is recommended for surveillance and that there is insufficient evidence for prophylactic mastectomy.   Return to clinic in 2 weeks for start of Taxol treatments.

## 2014-10-20 NOTE — Progress Notes (Signed)
Patient Care Team: Kelton Pillar, MD as PCP - General (Family Medicine) Rulon Eisenmenger, MD as Consulting Physician (Hematology and Oncology) Excell Seltzer, MD as Consulting Physician (General Surgery) Eppie Gibson, MD as Attending Physician (Radiation Oncology) Trinda Pascal, NP as Nurse Practitioner (Nurse Practitioner)  DIAGNOSIS: Breast cancer of upper-outer quadrant of right female breast   Staging form: Breast, AJCC 7th Edition     Clinical: Stage IIIA (T3, N1, M0) - Unsigned       Staging comments: Staged at breast conference 11.11.15    SUMMARY OF ONCOLOGIC HISTORY:   Breast cancer of upper-outer quadrant of right female breast   08/05/2014 Initial Diagnosis Invasive ductal carcinoma, 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, grade 3 ER 0%, PR 0%, HER-2 negative, Ki-67 90%: Right axillary lymph node positive for breast cancer   08/12/2014 Breast MRI 4.5x 7.5 x4 cm biopsy-proven right breast upper outer quadrant cancer with biopsy-proven level I right axillary lymph node with 2 level II right axillary lymph nodes identified   09/08/2014 -  Neo-Adjuvant Chemotherapy Dose dense Adriamycin and Cytoxan x4 followed by weekly Taxol and carboplatin x12    CHIEF COMPLIANT: Cycle 4 of dose dense Adriamycin and Cytoxan  INTERVAL HISTORY: Jamie Edwards is a 44 year old lady with above-mentioned history of right-sided rest cancer currently on neoadjuvant chemotherapy. She is tolerating Adriamycin Cytoxan extremely well. Today's of Ortho-Cyclen will be the last. She will then receive weekly Taxol and carboplatin to 12 weeks. She reports no new complaints or concerns. She has been mildly anemic but overall stable. Denies any nausea or vomiting. Good bowel movements.  REVIEW OF SYSTEMS:   Constitutional: Denies fevers, chills or abnormal weight loss Eyes: Denies blurriness of vision Ears, nose, mouth, throat, and face: Denies mucositis or sore  throat Respiratory: Denies cough, dyspnea or wheezes Cardiovascular: Denies palpitation, chest discomfort or lower extremity swelling Gastrointestinal:  Denies nausea, heartburn or change in bowel habits Skin: Denies abnormal skin rashes Lymphatics: Denies new lymphadenopathy or easy bruising Neurological:Denies numbness, tingling or new weaknesses Behavioral/Psych: Mood is stable, no new changes  Breast: Breast appears to be much softer. All other systems were reviewed with the patient and are negative.  I have reviewed the past medical history, past surgical history, social history and family history with the patient and they are unchanged from previous note.  ALLERGIES:  is allergic to penicillins.  MEDICATIONS:  Current Outpatient Prescriptions  Medication Sig Dispense Refill  . ALPRAZolam (XANAX) 0.25 MG tablet Take 0.25 mg by mouth 2 (two) times daily as needed for anxiety.    Marland Kitchen dexamethasone (DECADRON) 4 MG tablet Take 2 tablets by mouth once a day on the day after chemotherapy and then take 2 tablets two times a day for 2 days. Take with food. 30 tablet 1  . lidocaine-prilocaine (EMLA) cream Apply to affected area once 30 g 3  . LORazepam (ATIVAN) 0.5 MG tablet Take 1 tablet (0.5 mg total) by mouth every 6 (six) hours as needed (Nausea or vomiting). 30 tablet 0  . ondansetron (ZOFRAN) 8 MG tablet Take 1 tablet (8 mg total) by mouth 2 (two) times daily as needed. Start on the third day after chemotherapy. 30 tablet 1  . oxyCODONE-acetaminophen (ROXICET) 5-325 MG per tablet Take 1-2 tablets by mouth every 4 (four) hours as needed. 30 tablet 0  . prochlorperazine (COMPAZINE) 10 MG tablet Take 1 tablet (10 mg total) by mouth every 6 (six) hours as needed (Nausea or vomiting).  30 tablet 1   No current facility-administered medications for this visit.    PHYSICAL EXAMINATION: ECOG PERFORMANCE STATUS: 1 - Symptomatic but completely ambulatory  Filed Vitals:   10/20/14 0939  BP:  113/66  Pulse: 88  Temp: 98.9 F (37.2 C)  Resp: 19   Filed Weights   10/20/14 0939  Weight: 202 lb 12.8 oz (91.989 kg)    GENERAL:alert, no distress and comfortable SKIN: skin color, texture, turgor are normal, no rashes or significant lesions EYES: normal, Conjunctiva are pink and non-injected, sclera clear OROPHARYNX:no exudate, no erythema and lips, buccal mucosa, and tongue normal  NECK: supple, thyroid normal size, non-tender, without nodularity LYMPH:  no palpable lymphadenopathy in the cervical, axillary or inguinal LUNGS: clear to auscultation and percussion with normal breathing effort HEART: regular rate & rhythm and no murmurs and no lower extremity edema ABDOMEN:abdomen soft, non-tender and normal bowel sounds Musculoskeletal:no cyanosis of digits and no clubbing  NEURO: alert & oriented x 3 with fluent speech, no focal motor/sensory deficits  LABORATORY DATA:  I have reviewed the data as listed   Chemistry      Component Value Date/Time   NA 141 10/05/2014 1438   K 4.1 10/05/2014 1438   CO2 33* 10/05/2014 1438   BUN 7.4 10/05/2014 1438   CREATININE 0.8 10/05/2014 1438      Component Value Date/Time   CALCIUM 9.4 10/05/2014 1438   ALKPHOS 106 10/05/2014 1438   AST 24 10/05/2014 1438   ALT 32 10/05/2014 1438   BILITOT <0.20 10/05/2014 1438       Lab Results  Component Value Date   WBC 10.4* 10/20/2014   HGB 10.6* 10/20/2014   HCT 33.7* 10/20/2014   MCV 85.9 10/20/2014   PLT 328 10/20/2014   NEUTROABS 8.6* 10/20/2014    ASSESSMENT & PLAN:  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 Today is  cycle 4 of AC. Patient's breasts appears to be much softer and appears to be responding to treatment. Hence we will avoid the midpoint MRI. She will have an MRI of the completion of treatment.  Toxicities of chemotherapy: 1. Alopecia 2. Chemotherapy-induced anemia grade 1 3. Sinus pressure: On over-the-counter cold and sinus medications Monitoring closely for toxicities  PALB2 Mutation:  The absolute breast-cancer risk for PALB2 female mutation carriers by 43 years of age ranged from 33% for those with no family history of breast cancer to 58% for those with two or more first-degree relatives with breast cancer at 43 years of age. Based on NCCN guidelines, Breast MRI is recommended for surveillance and that there is insufficient evidence for prophylactic mastectomy.   Return to clinic in 2 weeks for start of Taxol treatments.     Orders Placed This Encounter  Procedures  . CBC with Differential    Standing Status: Future     Number of Occurrences:      Standing Expiration Date: 10/20/2015  . Comprehensive metabolic panel (Cmet) - [facility]    Standing Status: Future     Number of Occurrences:      Standing Expiration Date: 10/20/2015   The patient has a good understanding of the overall plan. she agrees  with it. She will call with any problems that may develop before her next visit here.   Rulon Eisenmenger, MD _0 (<PARAMETER> error)@ 9:58 AM

## 2014-10-20 NOTE — Patient Instructions (Signed)
[facility] Discharge Instructions for Patients Receiving Chemotherapy  Today you received the following chemotherapy agents Adriamycin and Cytoxan.  To help prevent nausea and vomiting after your treatment, we encourage you to take your nausea medication.   If you develop nausea and vomiting that is not controlled by your nausea medication, call the clinic.   BELOW ARE SYMPTOMS THAT SHOULD BE REPORTED IMMEDIATELY:  *FEVER GREATER THAN 100.5 F  *CHILLS WITH OR WITHOUT FEVER  NAUSEA AND VOMITING THAT IS NOT CONTROLLED WITH YOUR NAUSEA MEDICATION  *UNUSUAL SHORTNESS OF BREATH  *UNUSUAL BRUISING OR BLEEDING  TENDERNESS IN MOUTH AND THROAT WITH OR WITHOUT PRESENCE OF ULCERS  *URINARY PROBLEMS  *BOWEL PROBLEMS  UNUSUAL RASH Items with * indicate a potential emergency and should be followed up as soon as possible.  Feel free to call the clinic you have any questions or concerns. The clinic phone number is [phone].

## 2014-10-20 NOTE — Progress Notes (Signed)
REFERRING PROVIDER: Kelton Pillar, MD [address] [place], [place] [address]   Nicholas Lose, MD  PRIMARY PROVIDER:  Osborne Casco, MD  PRIMARY REASON FOR VISIT:  1. Monoallelic mutation of PALB2 gene   2. 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 female, was seen for a discussion of the PABL2 mutation found on genetic testing.  Jamie Edwards presents to clinic today to discuss the possibility of a hereditary predisposition to cancer, genetic testing, and to further clarify her future cancer risks, as well as potential cancer risks for family members.   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  At that time she underwent genetic testing based on her personal history of breast cancer and family history of breast and ovarian cancer.  The BreastNext panel testing was recommended.  The BreastNext gene panel offered by [facility] and includes sequencing and rearrangement analysis for the following 17 genes: ATM, BARD1, BRCA1, BRCA2, BRIP1, CDH1, CHEK2, MRE11A, MUTYH, NBN, NF1, PALB2, PTEN, RAD50, RAD51C, RAD51D, and TP53.  Jamie Edwards was found to carry a PALB2 [id number] mutation.      Past Medical History   Diagnosis Date  . Breast cancer   . Medical history non-contributory   . Anxiety     Past Surgical History  Procedure Laterality Date  . Mouth surgery      implants  . Portacath placement Right 08/31/2014    Procedure: INSERTION PORT-A-CATH WITH ULTRA SOUND GUIDENCE;  Surgeon: Erroll Luna, MD;  Location: [place];  Service: General;  Laterality: Right;    History   Social History  . Marital Status: Single    Spouse Name: N/A    Number of Children: 1  . Years of Education: N/A   Social History Main Topics  . Smoking status: Never Smoker   . Smokeless tobacco: Not on file  . Alcohol Use: No  . Drug Use: No  . Sexual Activity: Not on file   Other Topics Concern  . Not on file   Social History Narrative     FAMILY HISTORY:  We obtained a detailed, 4-generation family history.  Significant diagnoses are listed below: Family History  Problem Relation Age of Onset  . Breast cancer Mother 2  . Prostate cancer Father 47  . Esophageal cancer Paternal Uncle     smoker  . Breast cancer Cousin     Patient's maternal ancestors are of [place] American and Caucasian descent, and paternal ancestors are of African American descent. There is no reported Ashkenazi Jewish ancestry. There is no known consanguinity.  GENETIC COUNSELING ASSESSMENT: Jamie Edwards is a 44 y.o. female with a PALB2 c.[id number] mutation. We, therefore, discussed and recommended the following at today's  visit.   DISCUSSION: We reviewed autosomal dominant inheritance, natural history of hereditary breast/pancreatic cancer syndrome, and current NCCN medical management guidelines for individuals with an increased risk for breast cancer.  The patient was provided with a packet of support resources for individuals with a hereditary cancer syndrome, including information about the [employer] support organization.  We discussed that Seymour is thought to be a moderate risk gene, and to date most of  the literature conveys a risk for breast cancer of about 25-40%. A 2014 paper suggests that the risk for breast cancer may be an average of 35% lifetime risk and based on family history could be higher (Antoniou, NEJM [phone]).  We discussed that this is one paper, and would need to be replicated, but that it is also the largest to date. We reviewed the risk for other cancers in carriers for PALB2 mutations, including including female breast cancer, prostate, ovarian and pancreatic cancer, although these risks have not yet been quantified. The current NCCN guidelines for screening for breast cancer in women with PALB2, suggests performing breast MRI as the lifetime risk for breast cancer is over 20%.  The American Cancer Society recommendations suggest screening all women with over a 20% lifetime risk for breast cancer with breast MRI.  A discussion of the utility of a breast MRI, and clinical breast exams also occurred. Lastly we discussed ways to decrease the risk for cancer, in general, including healthy living (not smoking, exercise, healthy weight).   The [facility] in [place] has an inherited cancer registry.  Currently, they are recruiting individuals with known PALB2 mutations to participate.  Jamie Edwards was provided information about the registry and a newsletter about an ongoing study.  She will consider participating and submit the postcard if she decides to proceed.   We discussed that this mutation is likely coming from her maternal side of the family.  We reviewed risks for other family members, specifically her sister and her daughter are at 50% risk for carring the same mutation.  Additionally, maternal cousins and aunts are at risk.  She will contact family members to reveal her results and discuss further testing for them.  PLAN: Jamie Edwards will contact other family members regarding their risk for the inherited PALB2 mutation, as well as provide them a copy of her test  result for testing.  Additionally, she was referred back to Dr. Nicholas Lose, her medical oncologist, as he is part of the [facility] High Risk Program. Dr. Lindi Adie will be able to discuss further high risk screening for Jamie Edwards after she has completed her treatment.  Lastly, we encouraged Jamie Edwards to remain in contact with cancer genetics annually so that we can continuously update the family history and inform her of any changes in cancer genetics and testing that may be of benefit for this family.   Ms.  Edwards questions were answered to her satisfaction today. Our contact information was provided should additional questions or concerns arise. Thank you for the referral and allowing [place] to share in the care of your patient.   Karen P. Florene Glen, [place], [facility] Certified Genetic Counselor [email or web address] phone: [phone]  The patient was seen for a total of 30 minutes in face-to-face genetic counseling.  This patient was discussed with Drs. Magrinat, Lindi Adie and/or Burr Medico who agrees with the above.    _______________________________________________________________________ For Office Staff:  Number of people involved in session: 2 Was an Intern/ student involved with case: no

## 2014-10-21 ENCOUNTER — Ambulatory Visit (HOSPITAL_BASED_OUTPATIENT_CLINIC_OR_DEPARTMENT_OTHER): Payer: 59

## 2014-10-21 DIAGNOSIS — C50411 Malignant neoplasm of upper-outer quadrant of right female breast: Secondary | ICD-10-CM

## 2014-10-21 DIAGNOSIS — Z5189 Encounter for other specified aftercare: Secondary | ICD-10-CM

## 2014-10-21 DIAGNOSIS — C773 Secondary and unspecified malignant neoplasm of axilla and upper limb lymph nodes: Secondary | ICD-10-CM

## 2014-10-21 MED ORDER — PEGFILGRASTIM INJECTION 6 MG/0.6ML ~~LOC~~
6.0000 mg | PREFILLED_SYRINGE | Freq: Once | SUBCUTANEOUS | Status: AC
  Administered 2014-10-21: 6 mg via SUBCUTANEOUS
  Filled 2014-10-21: qty 0.6

## 2014-10-24 ENCOUNTER — Telehealth: Payer: Self-pay | Admitting: Hematology and Oncology

## 2014-10-24 ENCOUNTER — Telehealth: Payer: Self-pay | Admitting: *Deleted

## 2014-10-24 NOTE — Telephone Encounter (Signed)
Per staff message and POF I moved 1/21 to 1/28. Advised scheduler of appts. JMW

## 2014-10-24 NOTE — Telephone Encounter (Signed)
, °

## 2014-10-27 ENCOUNTER — Ambulatory Visit: Payer: 59

## 2014-11-03 ENCOUNTER — Other Ambulatory Visit (HOSPITAL_BASED_OUTPATIENT_CLINIC_OR_DEPARTMENT_OTHER): Payer: 59

## 2014-11-03 ENCOUNTER — Ambulatory Visit (HOSPITAL_BASED_OUTPATIENT_CLINIC_OR_DEPARTMENT_OTHER): Payer: 59 | Admitting: Hematology and Oncology

## 2014-11-03 ENCOUNTER — Telehealth: Payer: Self-pay | Admitting: *Deleted

## 2014-11-03 ENCOUNTER — Ambulatory Visit (HOSPITAL_BASED_OUTPATIENT_CLINIC_OR_DEPARTMENT_OTHER): Payer: 59

## 2014-11-03 ENCOUNTER — Telehealth: Payer: Self-pay | Admitting: Hematology and Oncology

## 2014-11-03 VITALS — BP 109/67 | HR 91 | Temp 98.6°F | Resp 19 | Ht 67.0 in | Wt 206.8 lb

## 2014-11-03 DIAGNOSIS — C773 Secondary and unspecified malignant neoplasm of axilla and upper limb lymph nodes: Secondary | ICD-10-CM

## 2014-11-03 DIAGNOSIS — C50411 Malignant neoplasm of upper-outer quadrant of right female breast: Secondary | ICD-10-CM

## 2014-11-03 DIAGNOSIS — Z5111 Encounter for antineoplastic chemotherapy: Secondary | ICD-10-CM

## 2014-11-03 LAB — COMPREHENSIVE METABOLIC PANEL (CC13)
ALBUMIN: 3.3 g/dL — AB (ref 3.5–5.0)
ALT: 14 U/L (ref 0–55)
AST: 17 U/L (ref 5–34)
Alkaline Phosphatase: 107 U/L (ref 40–150)
Anion Gap: 10 mEq/L (ref 3–11)
BUN: 7.1 mg/dL (ref 7.0–26.0)
CO2: 29 mEq/L (ref 22–29)
CREATININE: 0.8 mg/dL (ref 0.6–1.1)
Calcium: 9.5 mg/dL (ref 8.4–10.4)
Chloride: 103 mEq/L (ref 98–109)
EGFR: >90 mL/min/{1.73_m2} (ref >90)
GLUCOSE: 95 mg/dL (ref 70–140)
POTASSIUM: 4 meq/L (ref 3.5–5.1)
SODIUM: 143 meq/L (ref 136–145)
Total Bilirubin: <0.2 mg/dL (ref 0.20–1.20)
Total Protein: 6.6 g/dL (ref 6.4–8.3)

## 2014-11-03 LAB — CBC WITH DIFFERENTIAL/PLATELET
BASO%: 0.5 % (ref 0.0–2.0)
Basophils Absolute: 0 10*3/uL (ref 0.0–0.1)
EOS ABS: 0 10*3/uL (ref 0.0–0.5)
EOS%: 0 % (ref 0.0–7.0)
HCT: 33.9 % — ABNORMAL LOW (ref 34.8–46.6)
HGB: 10.9 g/dL — ABNORMAL LOW (ref 11.6–15.9)
LYMPH#: 0.9 10*3/uL (ref 0.9–3.3)
LYMPH%: 11.1 % — AB (ref 14.0–49.7)
MCH: 27.9 pg (ref 25.1–34.0)
MCHC: 32.2 g/dL (ref 31.5–36.0)
MCV: 86.7 fL (ref 79.5–101.0)
MONO#: 0.7 10*3/uL (ref 0.1–0.9)
MONO%: 8.4 % (ref 0.0–14.0)
NEUT#: 6.5 10*3/uL (ref 1.5–6.5)
NEUT%: 80 % — ABNORMAL HIGH (ref 38.4–76.8)
Platelets: 206 10*3/uL (ref 145–400)
RBC: 3.91 10*6/uL (ref 3.70–5.45)
RDW: 17.7 % — ABNORMAL HIGH (ref 11.2–14.5)
WBC: 8.1 10*3/uL (ref 3.9–10.3)

## 2014-11-03 MED ORDER — FAMOTIDINE IN NACL 20-0.9 MG/50ML-% IV SOLN
INTRAVENOUS | Status: AC
  Filled 2014-11-03: qty 50

## 2014-11-03 MED ORDER — SODIUM CHLORIDE 0.9 % IV SOLN
300.0000 mg | Freq: Once | INTRAVENOUS | Status: AC
  Administered 2014-11-03: 300 mg via INTRAVENOUS
  Filled 2014-11-03: qty 30

## 2014-11-03 MED ORDER — DIPHENHYDRAMINE HCL 50 MG/ML IJ SOLN
50.0000 mg | Freq: Once | INTRAMUSCULAR | Status: AC
  Administered 2014-11-03: 50 mg via INTRAVENOUS

## 2014-11-03 MED ORDER — DEXAMETHASONE SODIUM PHOSPHATE 20 MG/5ML IJ SOLN
INTRAMUSCULAR | Status: AC
  Filled 2014-11-03: qty 5

## 2014-11-03 MED ORDER — ONDANSETRON 16 MG/50ML IVPB (CHCC)
INTRAVENOUS | Status: AC
  Filled 2014-11-03: qty 16

## 2014-11-03 MED ORDER — FAMOTIDINE IN NACL 20-0.9 MG/50ML-% IV SOLN
20.0000 mg | Freq: Once | INTRAVENOUS | Status: AC
  Administered 2014-11-03: 20 mg via INTRAVENOUS

## 2014-11-03 MED ORDER — DIPHENHYDRAMINE HCL 50 MG/ML IJ SOLN
INTRAMUSCULAR | Status: AC
  Filled 2014-11-03: qty 1

## 2014-11-03 MED ORDER — PACLITAXEL CHEMO INJECTION 300 MG/50ML
80.0000 mg/m2 | Freq: Once | INTRAVENOUS | Status: AC
  Administered 2014-11-03: 168 mg via INTRAVENOUS
  Filled 2014-11-03: qty 28

## 2014-11-03 MED ORDER — SODIUM CHLORIDE 0.9 % IV SOLN
Freq: Once | INTRAVENOUS | Status: AC
  Administered 2014-11-03: 13:00:00 via INTRAVENOUS

## 2014-11-03 MED ORDER — SODIUM CHLORIDE 0.9 % IJ SOLN
10.0000 mL | INTRAMUSCULAR | Status: DC | PRN
  Administered 2014-11-03: 10 mL
  Filled 2014-11-03: qty 10

## 2014-11-03 MED ORDER — DEXAMETHASONE SODIUM PHOSPHATE 20 MG/5ML IJ SOLN
20.0000 mg | Freq: Once | INTRAMUSCULAR | Status: AC
  Administered 2014-11-03: 20 mg via INTRAVENOUS

## 2014-11-03 MED ORDER — HEPARIN SOD (PORK) LOCK FLUSH 100 UNIT/ML IV SOLN
500.0000 [IU] | Freq: Once | INTRAVENOUS | Status: AC | PRN
  Administered 2014-11-03: 500 [IU]
  Filled 2014-11-03: qty 5

## 2014-11-03 MED ORDER — ONDANSETRON 16 MG/50ML IVPB (CHCC)
16.0000 mg | Freq: Once | INTRAVENOUS | Status: AC
  Administered 2014-11-03: 16 mg via INTRAVENOUS

## 2014-11-03 NOTE — Telephone Encounter (Signed)
perpo f to sch pt appt-sent MW emailt to sch pt appt-pt to get updated copy b4 leaving  trmtroom

## 2014-11-03 NOTE — Telephone Encounter (Signed)
Per staff message and POF I have scheduled appts. Advised scheduler of appts. JMW  

## 2014-11-03 NOTE — Assessment & Plan Note (Addendum)
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  Today is cycle 1 of Taxol and carboplatin.  Toxicities of chemotherapy: 1. Alopecia 2. Chemotherapy-induced anemia grade 1 3. Sinus pressure: On over-the-counter cold and sinus medications. Resolved Monitoring closely for toxicities  Return to clinic in 1 week for tox check and weekly for chemotherapy

## 2014-11-03 NOTE — Patient Instructions (Signed)
[facility] Discharge Instructions for Patients Receiving Chemotherapy  Today you received the following chemotherapy agents:  Taxol and Carboplatin  To help prevent nausea and vomiting after your treatment, we encourage you to take your nausea medication as ordered per MD.   If you develop nausea and vomiting that is not controlled by your nausea medication, call the clinic.   BELOW ARE SYMPTOMS THAT SHOULD BE REPORTED IMMEDIATELY:  *FEVER GREATER THAN 100.5 F  *CHILLS WITH OR WITHOUT FEVER  NAUSEA AND VOMITING THAT IS NOT CONTROLLED WITH YOUR NAUSEA MEDICATION  *UNUSUAL SHORTNESS OF BREATH  *UNUSUAL BRUISING OR BLEEDING  TENDERNESS IN MOUTH AND THROAT WITH OR WITHOUT PRESENCE OF ULCERS  *URINARY PROBLEMS  *BOWEL PROBLEMS  UNUSUAL RASH Items with * indicate a potential emergency and should be followed up as soon as possible.  Feel free to call the clinic you have any questions or concerns. The clinic phone number is [phone].    

## 2014-11-03 NOTE — Progress Notes (Signed)
Patient Care Team: Kelton Pillar, MD as PCP - General (Family Medicine) Rulon Eisenmenger, MD as Consulting Physician (Hematology and Oncology) Excell Seltzer, MD as Consulting Physician (General Surgery) Eppie Gibson, MD as Attending Physician (Radiation Oncology) Trinda Pascal, NP as Nurse Practitioner (Nurse Practitioner)  DIAGNOSIS: Breast cancer of upper-outer quadrant of right female breast   Staging form: Breast, AJCC 7th Edition     Clinical: Stage IIIA (T3, N1, M0) - Unsigned       Staging comments: Staged at breast conference 11.11.15    SUMMARY OF ONCOLOGIC HISTORY:   Breast cancer of upper-outer quadrant of right female breast   08/05/2014 Initial Diagnosis Invasive ductal carcinoma, 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, grade 3 ER 0%, PR 0%, HER-2 negative, Ki-67 90%: Right axillary lymph node positive for breast cancer   08/12/2014 Breast MRI 4.5x 7.5 x4 cm biopsy-proven right breast upper outer quadrant cancer with biopsy-proven level I right axillary lymph node with 2 level II right axillary lymph nodes identified   09/08/2014 -  Neo-Adjuvant Chemotherapy Dose dense Adriamycin and Cytoxan x4 followed by weekly Taxol and carboplatin x12    CHIEF COMPLIANT: Cycle 1 Taxol carboplatin  INTERVAL HISTORY: Jamie Edwards is a 44 year old lady with above-mentioned history of right-sided breast cancer currently on neoadjuvant chemotherapy. She completed 4 cycles of dose dense Adriamycin and Cytoxan. Today is cycle 1 of Taxol and carboplatin. She had felt a sharp pain in the right breast. Denies any nausea vomiting. Denies any fevers or chills. Denies any neuropathy.  REVIEW OF SYSTEMS:   Constitutional: Denies fevers, chills or abnormal weight loss Eyes: Denies blurriness of vision Ears, nose, mouth, throat, and face: Denies mucositis or sore throat Respiratory: Denies cough, dyspnea or wheezes Cardiovascular: Denies palpitation, chest  discomfort or lower extremity swelling Gastrointestinal:  Denies nausea, heartburn or change in bowel habits Skin: Denies abnormal skin rashes Lymphatics: Denies new lymphadenopathy or easy bruising Neurological:Denies numbness, tingling or new weaknesses Behavioral/Psych: Mood is stable, no new changes  Breast: Sharp pain in the right breast which has resolved All other systems were reviewed with the patient and are negative.  I have reviewed the past medical history, past surgical history, social history and family history with the patient and they are unchanged from previous note.  ALLERGIES:  is allergic to penicillins.  MEDICATIONS:  Current Outpatient Prescriptions  Medication Sig Dispense Refill  . ALPRAZolam (XANAX) 0.25 MG tablet Take 0.25 mg by mouth 2 (two) times daily as needed for anxiety.    Marland Kitchen dexamethasone (DECADRON) 4 MG tablet Take 2 tablets by mouth once a day on the day after chemotherapy and then take 2 tablets two times a day for 2 days. Take with food. 30 tablet 1  . lidocaine-prilocaine (EMLA) cream Apply to affected area once 30 g 3  . ondansetron (ZOFRAN) 8 MG tablet Take 1 tablet (8 mg total) by mouth 2 (two) times daily as needed. Start on the third day after chemotherapy. 30 tablet 1  . oxyCODONE-acetaminophen (ROXICET) 5-325 MG per tablet Take 1-2 tablets by mouth every 4 (four) hours as needed. 30 tablet 0  . prochlorperazine (COMPAZINE) 10 MG tablet Take 1 tablet (10 mg total) by mouth every 6 (six) hours as needed (Nausea or vomiting). 30 tablet 1   No current facility-administered medications for this visit.    PHYSICAL EXAMINATION: ECOG PERFORMANCE STATUS: 1 - Symptomatic but completely ambulatory  Filed Vitals:   11/03/14 1103  BP: 109/67  Pulse: 91  Temp: 98.6 F (37 C)  Resp: 19   Filed Weights   11/03/14 1103  Weight: 206 lb 12.8 oz (93.804 kg)    GENERAL:alert, no distress and comfortable SKIN: skin color, texture, turgor are normal,  no rashes or significant lesions EYES: normal, Conjunctiva are pink and non-injected, sclera clear OROPHARYNX:no exudate, no erythema and lips, buccal mucosa, and tongue normal  NECK: supple, thyroid normal size, non-tender, without nodularity LYMPH:  no palpable lymphadenopathy in the cervical, axillary or inguinal LUNGS: clear to auscultation and percussion with normal breathing effort HEART: regular rate & rhythm and no murmurs and no lower extremity edema ABDOMEN:abdomen soft, non-tender and normal bowel sounds Musculoskeletal:no cyanosis of digits and no clubbing  NEURO: alert & oriented x 3 with fluent speech, no focal motor/sensory deficits BREAST: Right breast palpable mass is much smaller. No palpable axillary supraclavicular or infraclavicular adenopathy no breast tenderness or nipple discharge. (exam performed in the presence of a chaperone)  LABORATORY DATA:  I have reviewed the data as listed   Chemistry      Component Value Date/Time   NA 142 10/20/2014 0926   K 3.9 10/20/2014 0926   CO2 33* 10/20/2014 0926   BUN 8.3 10/20/2014 0926   CREATININE 0.8 10/20/2014 0926      Component Value Date/Time   CALCIUM 9.5 10/20/2014 0926   ALKPHOS 106 10/20/2014 0926   AST 16 10/20/2014 0926   ALT 17 10/20/2014 0926   BILITOT <0.20 10/20/2014 0926       Lab Results  Component Value Date   WBC 8.1 11/03/2014   HGB 10.9* 11/03/2014   HCT 33.9* 11/03/2014   MCV 86.7 11/03/2014   PLT 206 11/03/2014   NEUTROABS 6.5 11/03/2014   ASSESSMENT & PLAN:  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  Today is cycle 1 of Taxol and carboplatin.  Toxicities of chemotherapy: 1. Alopecia 2. Chemotherapy-induced anemia grade 1 3. Sinus pressure: On over-the-counter cold and sinus medications. Resolved Monitoring closely for toxicities  Return to clinic in 1 week for tox check and weekly for chemotherapy    Orders Placed This Encounter  Procedures  . CBC with Differential    Standing Status: Future     Number of Occurrences:      Standing Expiration Date: 11/03/2015  . Comprehensive metabolic panel (Cmet) - [facility]    Standing Status: Future     Number of Occurrences:      Standing Expiration Date: 11/03/2015   The patient has a good understanding of the overall plan. she agrees with it. She will call with any problems that may develop before her next visit here.   Rulon Eisenmenger, MD

## 2014-11-04 ENCOUNTER — Telehealth: Payer: Self-pay | Admitting: Hematology and Oncology

## 2014-11-04 ENCOUNTER — Telehealth: Payer: Self-pay | Admitting: *Deleted

## 2014-11-04 NOTE — Telephone Encounter (Signed)
per reply from MW-cld pt * left pt a messsage of appt time & inf-adv to get updated sch on 2/3

## 2014-11-04 NOTE — Telephone Encounter (Signed)
Chemo follow up: Patient reports no symptoms. Will call if she has any questions/concerns.

## 2014-11-04 NOTE — Telephone Encounter (Signed)
-----   Message from [place], RN sent at 11/03/2014  4:16 PM EST ----- Regarding: Chemo f/u call Contact: [phone] First time Taxol and Carboplatin-Dr. Lindi Adie

## 2014-11-07 DIAGNOSIS — I82409 Acute embolism and thrombosis of unspecified deep veins of unspecified lower extremity: Secondary | ICD-10-CM

## 2014-11-07 DIAGNOSIS — I2699 Other pulmonary embolism without acute cor pulmonale: Secondary | ICD-10-CM

## 2014-11-07 DIAGNOSIS — R000 Tachycardia, unspecified: Secondary | ICD-10-CM

## 2014-11-07 HISTORY — DX: Tachycardia, unspecified: R00.0

## 2014-11-07 HISTORY — DX: Acute embolism and thrombosis of unspecified deep veins of unspecified lower extremity: I82.409

## 2014-11-07 HISTORY — DX: Other pulmonary embolism without acute cor pulmonale: I26.99

## 2014-11-09 ENCOUNTER — Telehealth: Payer: Self-pay | Admitting: Hematology and Oncology

## 2014-11-09 ENCOUNTER — Ambulatory Visit (HOSPITAL_BASED_OUTPATIENT_CLINIC_OR_DEPARTMENT_OTHER): Payer: 59 | Admitting: Hematology and Oncology

## 2014-11-09 ENCOUNTER — Other Ambulatory Visit (HOSPITAL_BASED_OUTPATIENT_CLINIC_OR_DEPARTMENT_OTHER): Payer: 59

## 2014-11-09 VITALS — BP 108/70 | HR 98 | Temp 98.4°F | Resp 18 | Ht 67.0 in | Wt 205.5 lb

## 2014-11-09 DIAGNOSIS — L659 Nonscarring hair loss, unspecified: Secondary | ICD-10-CM

## 2014-11-09 DIAGNOSIS — R110 Nausea: Secondary | ICD-10-CM

## 2014-11-09 DIAGNOSIS — Z171 Estrogen receptor negative status [ER-]: Secondary | ICD-10-CM

## 2014-11-09 DIAGNOSIS — C50411 Malignant neoplasm of upper-outer quadrant of right female breast: Secondary | ICD-10-CM

## 2014-11-09 DIAGNOSIS — C773 Secondary and unspecified malignant neoplasm of axilla and upper limb lymph nodes: Secondary | ICD-10-CM

## 2014-11-09 DIAGNOSIS — D6481 Anemia due to antineoplastic chemotherapy: Secondary | ICD-10-CM

## 2014-11-09 LAB — CBC WITH DIFFERENTIAL/PLATELET
BASO%: 1.1 % (ref 0.0–2.0)
Basophils Absolute: 0.1 10*3/uL (ref 0.0–0.1)
EOS ABS: 0 10*3/uL (ref 0.0–0.5)
EOS%: 0.2 % (ref 0.0–7.0)
HCT: 33.4 % — ABNORMAL LOW (ref 34.8–46.6)
HEMOGLOBIN: 10.7 g/dL — AB (ref 11.6–15.9)
LYMPH#: 0.7 10*3/uL — AB (ref 0.9–3.3)
LYMPH%: 14.5 % (ref 14.0–49.7)
MCH: 27.4 pg (ref 25.1–34.0)
MCHC: 31.9 g/dL (ref 31.5–36.0)
MCV: 85.9 fL (ref 79.5–101.0)
MONO#: 0.3 10*3/uL (ref 0.1–0.9)
MONO%: 6.4 % (ref 0.0–14.0)
NEUT#: 3.8 10*3/uL (ref 1.5–6.5)
NEUT%: 77.8 % — ABNORMAL HIGH (ref 38.4–76.8)
Platelets: 320 10*3/uL (ref 145–400)
RBC: 3.9 10*6/uL (ref 3.70–5.45)
RDW: 18.2 % — ABNORMAL HIGH (ref 11.2–14.5)
WBC: 4.9 10*3/uL (ref 3.9–10.3)

## 2014-11-09 LAB — COMPREHENSIVE METABOLIC PANEL (CC13)
ALBUMIN: 3.5 g/dL (ref 3.5–5.0)
ALT: 16 U/L (ref 0–55)
AST: 15 U/L (ref 5–34)
Alkaline Phosphatase: 78 U/L (ref 40–150)
Anion Gap: 10 mEq/L (ref 3–11)
BILIRUBIN TOTAL: 0.3 mg/dL (ref 0.20–1.20)
BUN: 18.6 mg/dL (ref 7.0–26.0)
CALCIUM: 9 mg/dL (ref 8.4–10.4)
CO2: 30 mEq/L — ABNORMAL HIGH (ref 22–29)
Chloride: 101 mEq/L (ref 98–109)
Creatinine: 0.8 mg/dL (ref 0.6–1.1)
EGFR: >90 mL/min/{1.73_m2} (ref >90)
Glucose: 137 mg/dl (ref 70–140)
Potassium: 4.1 mEq/L (ref 3.5–5.1)
SODIUM: 140 meq/L (ref 136–145)
Total Protein: 6.7 g/dL (ref 6.4–8.3)

## 2014-11-09 NOTE — Assessment & Plan Note (Addendum)
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  tomorrow is cycle 2 of Taxol and carboplatin.  Toxicities of chemotherapy: 1. Alopecia 2. Chemotherapy-induced anemia grade 1 3. Sinus pressure: On over-the-counter cold and sinus medications. Resolved 4. Nausea with Taxol carbo chemotherapy: I changed antiemetic regimen from Zofran to Aloxi IV Monitoring closely for toxicities  Return to clinic in 3 weeks for follow-up and weekly for chemotherapy

## 2014-11-09 NOTE — Telephone Encounter (Signed)
, °

## 2014-11-09 NOTE — Progress Notes (Signed)
Patient Care Team: Kelton Pillar, MD as PCP - General (Family Medicine) Rulon Eisenmenger, MD as Consulting Physician (Hematology and Oncology) Excell Seltzer, MD as Consulting Physician (General Surgery) Eppie Gibson, MD as Attending Physician (Radiation Oncology) Trinda Pascal, NP as Nurse Practitioner (Nurse Practitioner)  DIAGNOSIS: Breast cancer of upper-outer quadrant of right female breast   Staging form: Breast, AJCC 7th Edition     Clinical: Stage IIIA (T3, N1, M0) - Unsigned       Staging comments: Staged at breast conference 11.11.15    SUMMARY OF ONCOLOGIC HISTORY:   Breast cancer of upper-outer quadrant of right female breast   08/05/2014 Initial Diagnosis Invasive ductal carcinoma, 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, grade 3 ER 0%, PR 0%, HER-2 negative, Ki-67 90%: Right axillary lymph node positive for breast cancer   08/12/2014 Breast MRI 4.5x 7.5 x4 cm biopsy-proven right breast upper outer quadrant cancer with biopsy-proven level I right axillary lymph node with 2 level II right axillary lymph nodes identified   09/08/2014 -  Neo-Adjuvant Chemotherapy Dose dense Adriamycin and Cytoxan x4 followed by weekly Taxol and carboplatin x12    CHIEF COMPLIANT: tomorrow is cycle 2 of carbo Taxol  INTERVAL HISTORY: Jamie Edwards is a 44 year old lady with above-mentioned history of right-sided breast cancer currently on neoadjuvant chemotherapy. She completed dose dense Adriamycin and Cytoxan and is now on weekly Taxol and carboplatin. After the first dose of Taxol and carboplatin, she had nausea that lasted for an entire day. Subsequently she had done well without any further nausea issues. She also is reporting some blood per rectum started yesterday. It is very minor. She is also feeling slightly depressed recently.  REVIEW OF SYSTEMS:   Constitutional: Denies fevers, chills or abnormal weight loss Eyes: Denies blurriness of  vision Ears, nose, mouth, throat, and face: Denies mucositis or sore throat Respiratory: Denies cough, dyspnea or wheezes Cardiovascular: Denies palpitation, chest discomfort or lower extremity swelling Gastrointestinal:  Denies nausea, heartburn or change in bowel habits Skin: Denies abnormal skin rashes Lymphatics: Denies new lymphadenopathy or easy bruising Neurological:Denies numbness, tingling or new weaknesses Behavioral/Psych: mild depression  All other systems were reviewed with the patient and are negative.  I have reviewed the past medical history, past surgical history, social history and family history with the patient and they are unchanged from previous note.  ALLERGIES:  is allergic to penicillins.  MEDICATIONS:  Current Outpatient Prescriptions  Medication Sig Dispense Refill  . ALPRAZolam (XANAX) 0.25 MG tablet Take 0.25 mg by mouth 2 (two) times daily as needed for anxiety.    Marland Kitchen dexamethasone (DECADRON) 4 MG tablet Take 2 tablets by mouth once a day on the day after chemotherapy and then take 2 tablets two times a day for 2 days. Take with food. 30 tablet 1  . lidocaine-prilocaine (EMLA) cream Apply to affected area once 30 g 3  . ondansetron (ZOFRAN) 8 MG tablet Take 1 tablet (8 mg total) by mouth 2 (two) times daily as needed. Start on the third day after chemotherapy. 30 tablet 1  . oxyCODONE-acetaminophen (ROXICET) 5-325 MG per tablet Take 1-2 tablets by mouth every 4 (four) hours as needed. 30 tablet 0  . prochlorperazine (COMPAZINE) 10 MG tablet Take 1 tablet (10 mg total) by mouth every 6 (six) hours as needed (Nausea or vomiting). 30 tablet 1   No current facility-administered medications for this visit.    PHYSICAL EXAMINATION: ECOG PERFORMANCE STATUS: 1 - Symptomatic but  completely ambulatory  Filed Vitals:   11/09/14 1508  BP: 108/70  Pulse: 98  Temp: 98.4 F (36.9 C)  Resp: 18   Filed Weights   11/09/14 1508  Weight: 205 lb 8 oz (93.214 kg)     GENERAL:alert, no distress and comfortable SKIN: skin color, texture, turgor are normal, no rashes or significant lesions EYES: normal, Conjunctiva are pink and non-injected, sclera clear OROPHARYNX:no exudate, no erythema and lips, buccal mucosa, and tongue normal  NECK: supple, thyroid normal size, non-tender, without nodularity LYMPH:  no palpable lymphadenopathy in the cervical, axillary or inguinal LUNGS: clear to auscultation and percussion with normal breathing effort HEART: regular rate & rhythm and no murmurs and no lower extremity edema ABDOMEN:abdomen soft, non-tender and normal bowel sounds Musculoskeletal:no cyanosis of digits and no clubbing  NEURO: alert & oriented x 3 with fluent speech, no focal motor/sensory deficits  LABORATORY DATA:  I have reviewed the data as listed   Chemistry      Component Value Date/Time   NA 143 11/03/2014 1047   K 4.0 11/03/2014 1047   CO2 29 11/03/2014 1047   BUN 7.1 11/03/2014 1047   CREATININE 0.8 11/03/2014 1047      Component Value Date/Time   CALCIUM 9.5 11/03/2014 1047   ALKPHOS 107 11/03/2014 1047   AST 17 11/03/2014 1047   ALT 14 11/03/2014 1047   BILITOT <0.20 11/03/2014 1047       Lab Results  Component Value Date   WBC 4.9 11/09/2014   HGB 10.7* 11/09/2014   HCT 33.4* 11/09/2014   MCV 85.9 11/09/2014   PLT 320 11/09/2014   NEUTROABS 3.8 11/09/2014    ASSESSMENT & PLAN:  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  tomorrow is cycle 2 of Taxol and carboplatin.  Toxicities of chemotherapy: 1. Alopecia 2. Chemotherapy-induced anemia grade 1 3. Sinus pressure: On over-the-counter cold and sinus medications. Resolved 4. Nausea with Taxol carbo chemotherapy: I changed antiemetic regimen from Zofran to Aloxi IV Monitoring closely for toxicities  Return to clinic in 3 weeks for follow-up and weekly for chemotherapy    Orders Placed This Encounter  Procedures  . CBC with Differential    Standing Status: Future     Number of Occurrences:      Standing Expiration Date: 11/09/2015  . Comprehensive metabolic panel (Cmet) - [facility]    Standing Status: Future     Number of Occurrences:      Standing Expiration Date: 11/09/2015   The patient has a good understanding of the overall plan. she agrees with it. She will call with any problems that may develop before her next visit here.   Rulon Eisenmenger, MD

## 2014-11-10 ENCOUNTER — Ambulatory Visit (HOSPITAL_BASED_OUTPATIENT_CLINIC_OR_DEPARTMENT_OTHER): Payer: 59

## 2014-11-10 DIAGNOSIS — C773 Secondary and unspecified malignant neoplasm of axilla and upper limb lymph nodes: Secondary | ICD-10-CM

## 2014-11-10 DIAGNOSIS — C50411 Malignant neoplasm of upper-outer quadrant of right female breast: Secondary | ICD-10-CM

## 2014-11-10 DIAGNOSIS — Z5111 Encounter for antineoplastic chemotherapy: Secondary | ICD-10-CM

## 2014-11-10 MED ORDER — SODIUM CHLORIDE 0.9 % IJ SOLN
10.0000 mL | INTRAMUSCULAR | Status: DC | PRN
  Administered 2014-11-10: 10 mL
  Filled 2014-11-10: qty 10

## 2014-11-10 MED ORDER — DIPHENHYDRAMINE HCL 50 MG/ML IJ SOLN
25.0000 mg | Freq: Once | INTRAMUSCULAR | Status: AC
  Administered 2014-11-10: 25 mg via INTRAVENOUS

## 2014-11-10 MED ORDER — DEXAMETHASONE SODIUM PHOSPHATE 20 MG/5ML IJ SOLN
INTRAMUSCULAR | Status: AC
  Filled 2014-11-10: qty 5

## 2014-11-10 MED ORDER — DIPHENHYDRAMINE HCL 50 MG/ML IJ SOLN
INTRAMUSCULAR | Status: AC
  Filled 2014-11-10: qty 1

## 2014-11-10 MED ORDER — CARBOPLATIN CHEMO INJECTION 450 MG/45ML
300.0000 mg | Freq: Once | INTRAVENOUS | Status: AC
  Administered 2014-11-10: 300 mg via INTRAVENOUS
  Filled 2014-11-10: qty 30

## 2014-11-10 MED ORDER — PALONOSETRON HCL INJECTION 0.25 MG/5ML
INTRAVENOUS | Status: AC
  Filled 2014-11-10: qty 5

## 2014-11-10 MED ORDER — PACLITAXEL CHEMO INJECTION 300 MG/50ML
80.0000 mg/m2 | Freq: Once | INTRAVENOUS | Status: AC
  Administered 2014-11-10: 168 mg via INTRAVENOUS
  Filled 2014-11-10: qty 28

## 2014-11-10 MED ORDER — DEXAMETHASONE SODIUM PHOSPHATE 20 MG/5ML IJ SOLN
20.0000 mg | Freq: Once | INTRAMUSCULAR | Status: AC
  Administered 2014-11-10: 20 mg via INTRAVENOUS

## 2014-11-10 MED ORDER — PALONOSETRON HCL INJECTION 0.25 MG/5ML
0.2500 mg | Freq: Once | INTRAVENOUS | Status: AC
  Administered 2014-11-10: 0.25 mg via INTRAVENOUS

## 2014-11-10 MED ORDER — HEPARIN SOD (PORK) LOCK FLUSH 100 UNIT/ML IV SOLN
500.0000 [IU] | Freq: Once | INTRAVENOUS | Status: AC | PRN
  Administered 2014-11-10: 500 [IU]
  Filled 2014-11-10: qty 5

## 2014-11-10 MED ORDER — FAMOTIDINE IN NACL 20-0.9 MG/50ML-% IV SOLN
INTRAVENOUS | Status: AC
  Filled 2014-11-10: qty 50

## 2014-11-10 MED ORDER — FAMOTIDINE IN NACL 20-0.9 MG/50ML-% IV SOLN
20.0000 mg | Freq: Once | INTRAVENOUS | Status: AC
  Administered 2014-11-10: 20 mg via INTRAVENOUS

## 2014-11-10 MED ORDER — SODIUM CHLORIDE 0.9 % IV SOLN
Freq: Once | INTRAVENOUS | Status: AC
  Administered 2014-11-10: 09:00:00 via INTRAVENOUS

## 2014-11-10 NOTE — Progress Notes (Signed)
Dr. Lindi Adie aware of pt's HR of 110 and okay to proceed with treatment today.

## 2014-11-10 NOTE — Patient Instructions (Addendum)
[facility] Discharge Instructions for Patients Receiving Chemotherapy  Today you received the following chemotherapy agents: Taxol and Carbo.  To help prevent nausea and vomiting after your treatment, we encourage you to take your nausea medication: Compazine (prochlorperazine), 1 tablet every 6 hours as needed.  Do not take your Zofran (ondansetron) for the next 3 days since you received Aloxi today.   If you develop nausea and vomiting that is not controlled by your nausea medication, call the clinic.   BELOW ARE SYMPTOMS THAT SHOULD BE REPORTED IMMEDIATELY:  *FEVER GREATER THAN 100.5 F  *CHILLS WITH OR WITHOUT FEVER  NAUSEA AND VOMITING THAT IS NOT CONTROLLED WITH YOUR NAUSEA MEDICATION  *UNUSUAL SHORTNESS OF BREATH  *UNUSUAL BRUISING OR BLEEDING  TENDERNESS IN MOUTH AND THROAT WITH OR WITHOUT PRESENCE OF ULCERS  *URINARY PROBLEMS  *BOWEL PROBLEMS  UNUSUAL RASH Items with * indicate a potential emergency and should be followed up as soon as possible.  Feel free to call the clinic you have any questions or concerns. The clinic phone number is [phone].

## 2014-11-12 ENCOUNTER — Other Ambulatory Visit: Payer: Self-pay | Admitting: Hematology and Oncology

## 2014-11-12 DIAGNOSIS — C50411 Malignant neoplasm of upper-outer quadrant of right female breast: Secondary | ICD-10-CM

## 2014-11-15 ENCOUNTER — Other Ambulatory Visit: Payer: Self-pay | Admitting: Hematology and Oncology

## 2014-11-15 DIAGNOSIS — C50919 Malignant neoplasm of unspecified site of unspecified female breast: Secondary | ICD-10-CM

## 2014-11-17 ENCOUNTER — Other Ambulatory Visit: Payer: Self-pay | Admitting: Hematology and Oncology

## 2014-11-17 ENCOUNTER — Ambulatory Visit (HOSPITAL_BASED_OUTPATIENT_CLINIC_OR_DEPARTMENT_OTHER): Payer: 59

## 2014-11-17 ENCOUNTER — Other Ambulatory Visit: Payer: 59

## 2014-11-17 ENCOUNTER — Ambulatory Visit: Payer: 59 | Admitting: Hematology and Oncology

## 2014-11-17 ENCOUNTER — Other Ambulatory Visit (HOSPITAL_BASED_OUTPATIENT_CLINIC_OR_DEPARTMENT_OTHER): Payer: 59

## 2014-11-17 DIAGNOSIS — C50411 Malignant neoplasm of upper-outer quadrant of right female breast: Secondary | ICD-10-CM

## 2014-11-17 DIAGNOSIS — Z5111 Encounter for antineoplastic chemotherapy: Secondary | ICD-10-CM

## 2014-11-17 LAB — COMPREHENSIVE METABOLIC PANEL (CC13)
ALT: 19 U/L (ref 0–55)
ANION GAP: 10 meq/L (ref 3–11)
AST: 18 U/L (ref 5–34)
Albumin: 3.2 g/dL — ABNORMAL LOW (ref 3.5–5.0)
Alkaline Phosphatase: 78 U/L (ref 40–150)
BUN: 17.7 mg/dL (ref 7.0–26.0)
CO2: 31 mEq/L — ABNORMAL HIGH (ref 22–29)
CREATININE: 0.8 mg/dL (ref 0.6–1.1)
Calcium: 9.6 mg/dL (ref 8.4–10.4)
Chloride: 99 mEq/L (ref 98–109)
Glucose: 139 mg/dl (ref 70–140)
Potassium: 4 mEq/L (ref 3.5–5.1)
Sodium: 140 mEq/L (ref 136–145)
TOTAL PROTEIN: 6.5 g/dL (ref 6.4–8.3)
Total Bilirubin: 0.31 mg/dL (ref 0.20–1.20)

## 2014-11-17 LAB — CBC WITH DIFFERENTIAL/PLATELET
BASO%: 0.6 % (ref 0.0–2.0)
Basophils Absolute: 0 10*3/uL (ref 0.0–0.1)
EOS ABS: 0 10*3/uL (ref 0.0–0.5)
EOS%: 0.4 % (ref 0.0–7.0)
HCT: 32 % — ABNORMAL LOW (ref 34.8–46.6)
HGB: 10.3 g/dL — ABNORMAL LOW (ref 11.6–15.9)
LYMPH%: 6.9 % — ABNORMAL LOW (ref 14.0–49.7)
MCH: 27.7 pg (ref 25.1–34.0)
MCHC: 32 g/dL (ref 31.5–36.0)
MCV: 86.5 fL (ref 79.5–101.0)
MONO#: 0.4 10*3/uL (ref 0.1–0.9)
MONO%: 5.7 % (ref 0.0–14.0)
NEUT%: 86.4 % — ABNORMAL HIGH (ref 38.4–76.8)
NEUTROS ABS: 6.8 10*3/uL — AB (ref 1.5–6.5)
Platelets: 222 10*3/uL (ref 145–400)
RBC: 3.7 10*6/uL (ref 3.70–5.45)
RDW: 18.6 % — AB (ref 11.2–14.5)
WBC: 7.8 10*3/uL (ref 3.9–10.3)
lymph#: 0.5 10*3/uL — ABNORMAL LOW (ref 0.9–3.3)

## 2014-11-17 MED ORDER — FAMOTIDINE IN NACL 20-0.9 MG/50ML-% IV SOLN
INTRAVENOUS | Status: AC
  Filled 2014-11-17: qty 50

## 2014-11-17 MED ORDER — DIPHENHYDRAMINE HCL 50 MG/ML IJ SOLN
25.0000 mg | Freq: Once | INTRAMUSCULAR | Status: AC
  Administered 2014-11-17: 25 mg via INTRAVENOUS

## 2014-11-17 MED ORDER — DEXAMETHASONE SODIUM PHOSPHATE 20 MG/5ML IJ SOLN
INTRAMUSCULAR | Status: AC
  Filled 2014-11-17: qty 5

## 2014-11-17 MED ORDER — HEPARIN SOD (PORK) LOCK FLUSH 100 UNIT/ML IV SOLN
500.0000 [IU] | Freq: Once | INTRAVENOUS | Status: AC | PRN
  Administered 2014-11-17: 500 [IU]
  Filled 2014-11-17: qty 5

## 2014-11-17 MED ORDER — PALONOSETRON HCL INJECTION 0.25 MG/5ML
0.2500 mg | Freq: Once | INTRAVENOUS | Status: AC
  Administered 2014-11-17: 0.25 mg via INTRAVENOUS

## 2014-11-17 MED ORDER — SODIUM CHLORIDE 0.9 % IJ SOLN
10.0000 mL | INTRAMUSCULAR | Status: DC | PRN
  Administered 2014-11-17: 10 mL
  Filled 2014-11-17: qty 10

## 2014-11-17 MED ORDER — DIPHENHYDRAMINE HCL 50 MG/ML IJ SOLN
INTRAMUSCULAR | Status: AC
  Filled 2014-11-17: qty 1

## 2014-11-17 MED ORDER — CARBOPLATIN CHEMO INJECTION 450 MG/45ML
300.0000 mg | Freq: Once | INTRAVENOUS | Status: AC
  Administered 2014-11-17: 300 mg via INTRAVENOUS
  Filled 2014-11-17: qty 30

## 2014-11-17 MED ORDER — PALONOSETRON HCL INJECTION 0.25 MG/5ML
INTRAVENOUS | Status: AC
  Filled 2014-11-17: qty 5

## 2014-11-17 MED ORDER — DEXTROSE 5 % IV SOLN
80.0000 mg/m2 | Freq: Once | INTRAVENOUS | Status: AC
  Administered 2014-11-17: 168 mg via INTRAVENOUS
  Filled 2014-11-17: qty 28

## 2014-11-17 MED ORDER — SODIUM CHLORIDE 0.9 % IV SOLN
Freq: Once | INTRAVENOUS | Status: AC
  Administered 2014-11-17: 11:00:00 via INTRAVENOUS

## 2014-11-17 MED ORDER — DEXAMETHASONE SODIUM PHOSPHATE 20 MG/5ML IJ SOLN
20.0000 mg | Freq: Once | INTRAMUSCULAR | Status: AC
  Administered 2014-11-17: 20 mg via INTRAVENOUS

## 2014-11-17 MED ORDER — FAMOTIDINE IN NACL 20-0.9 MG/50ML-% IV SOLN
20.0000 mg | Freq: Once | INTRAVENOUS | Status: AC
  Administered 2014-11-17: 20 mg via INTRAVENOUS

## 2014-11-17 NOTE — Patient Instructions (Signed)
[facility] Discharge Instructions for Patients Receiving Chemotherapy  Today you received the following chemotherapy agents: Taxol and Carboplatin.  To help prevent nausea and vomiting after your treatment, we encourage you to take your nausea medication as prescribed.   If you develop nausea and vomiting that is not controlled by your nausea medication, call the clinic.   BELOW ARE SYMPTOMS THAT SHOULD BE REPORTED IMMEDIATELY:  *FEVER GREATER THAN 100.5 F  *CHILLS WITH OR WITHOUT FEVER  NAUSEA AND VOMITING THAT IS NOT CONTROLLED WITH YOUR NAUSEA MEDICATION  *UNUSUAL SHORTNESS OF BREATH  *UNUSUAL BRUISING OR BLEEDING  TENDERNESS IN MOUTH AND THROAT WITH OR WITHOUT PRESENCE OF ULCERS  *URINARY PROBLEMS  *BOWEL PROBLEMS  UNUSUAL RASH Items with * indicate a potential emergency and should be followed up as soon as possible.  Feel free to call the clinic you have any questions or concerns. The clinic phone number is [phone].    

## 2014-11-24 ENCOUNTER — Other Ambulatory Visit (HOSPITAL_BASED_OUTPATIENT_CLINIC_OR_DEPARTMENT_OTHER): Payer: 59

## 2014-11-24 ENCOUNTER — Ambulatory Visit (HOSPITAL_BASED_OUTPATIENT_CLINIC_OR_DEPARTMENT_OTHER): Payer: 59

## 2014-11-24 ENCOUNTER — Ambulatory Visit (HOSPITAL_BASED_OUTPATIENT_CLINIC_OR_DEPARTMENT_OTHER): Payer: 59 | Admitting: Nurse Practitioner

## 2014-11-24 ENCOUNTER — Other Ambulatory Visit: Payer: Self-pay

## 2014-11-24 ENCOUNTER — Telehealth: Payer: Self-pay

## 2014-11-24 VITALS — BP 113/73 | HR 129 | Temp 98.2°F

## 2014-11-24 DIAGNOSIS — C773 Secondary and unspecified malignant neoplasm of axilla and upper limb lymph nodes: Secondary | ICD-10-CM

## 2014-11-24 DIAGNOSIS — Z5111 Encounter for antineoplastic chemotherapy: Secondary | ICD-10-CM

## 2014-11-24 DIAGNOSIS — C50411 Malignant neoplasm of upper-outer quadrant of right female breast: Secondary | ICD-10-CM

## 2014-11-24 DIAGNOSIS — R000 Tachycardia, unspecified: Secondary | ICD-10-CM

## 2014-11-24 DIAGNOSIS — J40 Bronchitis, not specified as acute or chronic: Secondary | ICD-10-CM

## 2014-11-24 LAB — COMPREHENSIVE METABOLIC PANEL (CC13)
ALK PHOS: 73 U/L (ref 40–150)
ALT: 23 U/L (ref 0–55)
AST: 21 U/L (ref 5–34)
Albumin: 3.2 g/dL — ABNORMAL LOW (ref 3.5–5.0)
Anion Gap: 8 mEq/L (ref 3–11)
BUN: 14.8 mg/dL (ref 7.0–26.0)
CALCIUM: 9.4 mg/dL (ref 8.4–10.4)
CO2: 32 meq/L — AB (ref 22–29)
Chloride: 101 mEq/L (ref 98–109)
Creatinine: 0.8 mg/dL (ref 0.6–1.1)
EGFR: >90 mL/min/{1.73_m2} (ref >90)
GLUCOSE: 127 mg/dL (ref 70–140)
POTASSIUM: 4 meq/L (ref 3.5–5.1)
SODIUM: 140 meq/L (ref 136–145)
Total Bilirubin: 0.31 mg/dL (ref 0.20–1.20)
Total Protein: 6.2 g/dL — ABNORMAL LOW (ref 6.4–8.3)

## 2014-11-24 LAB — CBC WITH DIFFERENTIAL/PLATELET
BASO%: 0 % (ref 0.0–2.0)
Basophils Absolute: 0 10*3/uL (ref 0.0–0.1)
EOS%: 0.7 % (ref 0.0–7.0)
Eosinophils Absolute: 0 10*3/uL (ref 0.0–0.5)
HCT: 29.3 % — ABNORMAL LOW (ref 34.8–46.6)
HEMOGLOBIN: 9.6 g/dL — AB (ref 11.6–15.9)
LYMPH#: 0.6 10*3/uL — AB (ref 0.9–3.3)
LYMPH%: 12.7 % — ABNORMAL LOW (ref 14.0–49.7)
MCH: 28.5 pg (ref 25.1–34.0)
MCHC: 32.8 g/dL (ref 31.5–36.0)
MCV: 86.9 fL (ref 79.5–101.0)
MONO#: 0.2 10*3/uL (ref 0.1–0.9)
MONO%: 3.7 % (ref 0.0–14.0)
NEUT#: 3.8 10*3/uL (ref 1.5–6.5)
NEUT%: 82.9 % — AB (ref 38.4–76.8)
Platelets: 170 10*3/uL (ref 145–400)
RBC: 3.37 10*6/uL — ABNORMAL LOW (ref 3.70–5.45)
RDW: 18.2 % — ABNORMAL HIGH (ref 11.2–14.5)
WBC: 4.6 10*3/uL (ref 3.9–10.3)
nRBC: 0 % (ref 0–0)

## 2014-11-24 MED ORDER — AZITHROMYCIN 250 MG PO TABS: ORAL_TABLET | ORAL | Status: DC

## 2014-11-24 MED ORDER — PALONOSETRON HCL INJECTION 0.25 MG/5ML
0.2500 mg | Freq: Once | INTRAVENOUS | Status: AC
  Administered 2014-11-24: 0.25 mg via INTRAVENOUS

## 2014-11-24 MED ORDER — HEPARIN SOD (PORK) LOCK FLUSH 100 UNIT/ML IV SOLN
500.0000 [IU] | Freq: Once | INTRAVENOUS | Status: AC | PRN
  Administered 2014-11-24: 500 [IU]
  Filled 2014-11-24: qty 5

## 2014-11-24 MED ORDER — DIPHENHYDRAMINE HCL 50 MG/ML IJ SOLN
INTRAMUSCULAR | Status: AC
  Filled 2014-11-24: qty 1

## 2014-11-24 MED ORDER — FAMOTIDINE IN NACL 20-0.9 MG/50ML-% IV SOLN
20.0000 mg | Freq: Once | INTRAVENOUS | Status: AC
  Administered 2014-11-24: 20 mg via INTRAVENOUS

## 2014-11-24 MED ORDER — CARBOPLATIN CHEMO INJECTION 450 MG/45ML
300.0000 mg | Freq: Once | INTRAVENOUS | Status: AC
  Administered 2014-11-24: 300 mg via INTRAVENOUS
  Filled 2014-11-24: qty 30

## 2014-11-24 MED ORDER — FAMOTIDINE IN NACL 20-0.9 MG/50ML-% IV SOLN
INTRAVENOUS | Status: AC
  Filled 2014-11-24: qty 50

## 2014-11-24 MED ORDER — PALONOSETRON HCL INJECTION 0.25 MG/5ML
INTRAVENOUS | Status: AC
  Filled 2014-11-24: qty 5

## 2014-11-24 MED ORDER — DEXTROSE 5 % IV SOLN
80.0000 mg/m2 | Freq: Once | INTRAVENOUS | Status: AC
  Administered 2014-11-24: 168 mg via INTRAVENOUS
  Filled 2014-11-24: qty 28

## 2014-11-24 MED ORDER — HYDROCODONE-HOMATROPINE 5-1.5 MG/5ML PO SYRP
5.0000 mL | ORAL_SOLUTION | Freq: Four times a day (QID) | ORAL | Status: DC | PRN
Start: 2014-11-24 — End: 2015-02-02

## 2014-11-24 MED ORDER — DIPHENHYDRAMINE HCL 50 MG/ML IJ SOLN
25.0000 mg | Freq: Once | INTRAMUSCULAR | Status: AC
  Administered 2014-11-24: 25 mg via INTRAVENOUS

## 2014-11-24 MED ORDER — DEXAMETHASONE SODIUM PHOSPHATE 20 MG/5ML IJ SOLN
INTRAMUSCULAR | Status: AC
  Filled 2014-11-24: qty 5

## 2014-11-24 MED ORDER — SODIUM CHLORIDE 0.9 % IV SOLN
Freq: Once | INTRAVENOUS | Status: AC
  Administered 2014-11-24: 12:00:00 via INTRAVENOUS

## 2014-11-24 MED ORDER — DEXAMETHASONE SODIUM PHOSPHATE 20 MG/5ML IJ SOLN
20.0000 mg | Freq: Once | INTRAMUSCULAR | Status: AC
  Administered 2014-11-24: 20 mg via INTRAVENOUS

## 2014-11-24 MED ORDER — SODIUM CHLORIDE 0.9 % IJ SOLN
10.0000 mL | INTRAMUSCULAR | Status: DC | PRN
  Administered 2014-11-24: 10 mL
  Filled 2014-11-24: qty 10

## 2014-11-24 NOTE — Patient Instructions (Signed)
[facility] Discharge Instructions for Patients Receiving Chemotherapy  Today you received the following chemotherapy agents taxol, carboplatin  To help prevent nausea and vomiting after your treatment, we encourage you to take your nausea medication as needed   If you develop nausea and vomiting that is not controlled by your nausea medication, call the clinic.   BELOW ARE SYMPTOMS THAT SHOULD BE REPORTED IMMEDIATELY:  *FEVER GREATER THAN 100.5 F  *CHILLS WITH OR WITHOUT FEVER  NAUSEA AND VOMITING THAT IS NOT CONTROLLED WITH YOUR NAUSEA MEDICATION  *UNUSUAL SHORTNESS OF BREATH  *UNUSUAL BRUISING OR BLEEDING  TENDERNESS IN MOUTH AND THROAT WITH OR WITHOUT PRESENCE OF ULCERS  *URINARY PROBLEMS  *BOWEL PROBLEMS  UNUSUAL RASH Items with * indicate a potential emergency and should be followed up as soon as possible.  Feel free to call the clinic you have any questions or concerns. The clinic phone number is [phone].    

## 2014-11-24 NOTE — Progress Notes (Signed)
Pt's HR 128, rechecked and 122, pt is asymptomatic.  Dr Lindi Adie is out of the office, asked Cyndee Berniece Salines to evaluate to see if ok to treat.   Per [employer], EKG ordered, ok to proceed with treatment.

## 2014-11-24 NOTE — Telephone Encounter (Signed)
Rcvd telephone advice records from 2/18 822 am by fax at 2/18 15:03 pm.  Pt with s/sx of cold, going to infusion at 10 am.  On chart review, pt was evaluated in infusion by Retta Mac, NP at request of infusion room RN.

## 2014-11-25 ENCOUNTER — Encounter: Payer: Self-pay | Admitting: Nurse Practitioner

## 2014-11-25 DIAGNOSIS — R000 Tachycardia, unspecified: Secondary | ICD-10-CM | POA: Insufficient documentation

## 2014-11-25 DIAGNOSIS — J40 Bronchitis, not specified as acute or chronic: Secondary | ICD-10-CM | POA: Insufficient documentation

## 2014-11-25 NOTE — Assessment & Plan Note (Signed)
Patient noted to have URI symptoms which consist of nasal congestion and occasional headache.  No facial tenderness with palpation.  Oropharynx clear with no erythema or exudate.  Patient does have a dry, nonproductive cough on exam.  No wheezing or acute shortness of breath noted on exam.  Patient was afebrile.  Will prescribe patient Zithromax for mild bronchitis symptoms.  Patient is also requesting cough syrup to help her sleep at night.  Patient was prescribed Hycodan cough syrup today as well.

## 2014-11-25 NOTE — Assessment & Plan Note (Signed)
Patient is status post neoadjuvant dose dense Adriamycin/Cytoxan chemotherapy; and is currently undergoing weekly Taxol/carboplatin chemotherapy.  Patient received cycle 4 of her Taxol/carboplatin chemotherapy today.  Patient has plans to return for cycle 5 of the same regimen on 12/01/2014.

## 2014-11-25 NOTE — Assessment & Plan Note (Signed)
Patient presented to the [facility] today with tachycardia at 128.  Blood pressure was stable at 104/68; with O2 sat 100% on room air.  Patient was afebrile with a temperature of 98.2.  Patient denies any chest pain, chest pressure, shortness of breath, or pain with inspiration.  Patient does not appear dehydrated or toxic.  It is noted that patient's had documented tachycardia since 11/10/2014.  This could very well be a chemotherapy side effect.  EKG obtained today revealed a sinus tachycardia with a rate of 117; and a QTC of 438.  Tachycardic rate could also have increased due to mild bronchitis/URI symptoms.  Advised patient will continue to monitor closely.  Instructed patient to call/return of her directly to the emergency department if she develops any worsening symptoms whatsoever.

## 2014-11-25 NOTE — Progress Notes (Signed)
SYMPTOM MANAGEMENT CLINIC   HPI: Jamie Edwards 44 y.o. 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  She is complaining of some upper respiratory infection type symptoms which consist of nasal congestion and occasional headache.  She denies any facial tenderness with palpation.  She denies any sore throat.  She's also complaining of a frequent non-productive cough that is keeping her awake at night.  She denies any recent fevers or chills.  Patient has noted documented tachycardia intermittently since 11/10/2014.  She denies any chest pain, chest pressure, shortness breath, or pain with inspiration.   HPI  12/01/2014      SCHEDULING COMMUNICATION      palonosetron (ALOXI) injection 0.25 mg      diphenhydrAMINE (BENADRYL) injection 25 mg      Dexamethasone Sodium Phosphate (DECADRON) injection 20 mg      famotidine (PEPCID) IVPB 20 mg      PACLitaxel (TAXOL) 168 mg in dextrose 5 % 250 mL chemo infusion (</= $RemoveBefor'80mg'eWHXLnLBOqnS$ /m2)   CARBOplatin (PARAPLATIN) 300 mg in sodium chloride 0.9 % 100 mL chemo infusion      sodium chloride 0.9 % injection 10 mL      heparin lock flush 100 unit/mL      heparin lock flush 100 unit/mL      alteplase (CATHFLO ACTIVASE) injection 2 mg      sodium chloride 0.9 % injection 3 mL      Cold Pack 1 packet      diphenhydrAMINE (BENADRYL) injection 25 mg      famotidine (PEPCID) IVPB 20 mg      0.9 %  sodium chloride infusion      methylPREDNISolone sodium succinate (SOLU-MEDROL) 125 mg/2 mL injection 125 mg      EPINEPHrine (ADRENALIN) 0.1 MG/ML injection 0.25 mg      EPINEPHrine (ADRENALIN) 0.1 MG/ML injection 0.25 mg      EPINEPHrine (ADRENALIN) injection 0.5 mg      EPINEPHrine (ADRENALIN) injection 0.5 mg      diphenhydrAMINE (BENADRYL) injection 25 mg      albuterol (PROVENTIL) (2.5 MG/3ML) 0.083% nebulizer solution 2.5 mg      0.9 %  sodium chloride infusion      TREATMENT CONDITIONS 12/08/2014      SCHEDULING COMMUNICATION      palonosetron (ALOXI) injection 0.25 mg      diphenhydrAMINE (BENADRYL) injection 25 mg      Dexamethasone Sodium Phosphate (DECADRON) injection 20 mg      famotidine (PEPCID) IVPB 20 mg      PACLitaxel (TAXOL) 168  mg in dextrose 5 % 250 mL chemo infusion (</= $RemoveBefor'80mg'JWJuATuBWiZV$ /m2)      CARBOplatin (PARAPLATIN) 300 mg in sodium chloride 0.9 % 100 mL chemo infusion      sodium chloride 0.9 % injection 10 mL      heparin lock flush 100 unit/mL      heparin lock flush 100 unit/mL      alteplase (CATHFLO ACTIVASE) injection 2 mg      sodium chloride 0.9 % injection 3 mL      Cold Pack 1 packet      diphenhydrAMINE (BENADRYL) injection 25 mg      famotidine (PEPCID) IVPB 20 mg      0.9 %  sodium chloride infusion      methylPREDNISolone sodium succinate (SOLU-MEDROL) 125 mg/2 mL injection 125 mg      EPINEPHrine (ADRENALIN) 0.1 MG/ML injection 0.25 mg      EPINEPHrine (ADRENALIN) 0.1 MG/ML injection 0.25 mg      EPINEPHrine (ADRENALIN) injection 0.5 mg       EPINEPHrine (ADRENALIN) injection 0.5 mg      diphenhydrAMINE (BENADRYL) injection 25 mg      albuterol (PROVENTIL) (2.5 MG/3ML) 0.083% nebulizer solution 2.5 mg      0.9 %  sodium chloride infusion      TREATMENT CONDITIONS 12/15/2014      SCHEDULING COMMUNICATION      palonosetron (ALOXI) injection 0.25 mg      diphenhydrAMINE (BENADRYL) injection 25 mg      Dexamethasone Sodium Phosphate (DECADRON) injection 20 mg      famotidine (PEPCID) IVPB 20 mg      PACLitaxel (TAXOL) 168 mg in dextrose 5 % 250 mL chemo infusion (</= $RemoveBefor'80mg'pHqPjPRFfNXJ$ /m2)      CARBOplatin (PARAPLATIN) 300 mg in sodium chloride 0.9 % 100 mL chemo infusion      sodium chloride 0.9 % injection 10 mL      heparin lock flush 100 unit/mL      heparin lock flush 100 unit/mL      alteplase (CATHFLO ACTIVASE) injection 2 mg      sodium chloride 0.9 % injection 3 mL      Cold Pack 1 packet      diphenhydrAMINE (BENADRYL) injection 25 mg      famotidine (PEPCID) IVPB 20 mg      0.9 %  sodium chloride infusion      methylPREDNISolone sodium succinate (SOLU-MEDROL) 125 mg/2 mL injection 125 mg      EPINEPHrine (ADRENALIN) 0.1 MG/ML injection 0.25 mg      EPINEPHrine (ADRENALIN) 0.1 MG/ML injection 0.25 mg      EPINEPHrine (ADRENALIN) injection 0.5 mg      EPINEPHrine (ADRENALIN) injection 0.5 mg      diphenhydrAMINE (BENADRYL) injection 25 mg      albuterol (PROVENTIL) (2.5 MG/3ML) 0.083% nebulizer solution 2.5 mg      0.9 %  sodium chloride infusion      TREATMENT CONDITIONS    ROS  Past Medical History  Diagnosis Date  . Breast cancer   . Medical history non-contributory   . Anxiety     Past Surgical History  Procedure Laterality Date  . Mouth surgery      implants  . Portacath placement Right 08/31/2014    Procedure: INSERTION PORT-A-CATH WITH ULTRA SOUND GUIDENCE;  Surgeon: Erroll Luna, MD;  Location: [facility];  Service: General;  Laterality: Right;    has Breast cancer of upper-outer quadrant  of right  female breast; Constipation; Genetic testing; Monoallelic mutation of PALB2 gene; Bronchitis; and Tachycardia on her problem list.    is allergic to penicillins.    Medication List       This list is accurate as of: 11/24/14 11:59 PM.  Always use your most recent med list.               ALPRAZolam 0.25 MG tablet  Commonly known as:  XANAX  Take 0.25 mg by mouth 2 (two) times daily as needed for anxiety.     azithromycin 250 MG tablet  Commonly known as:  ZITHROMAX Z-PAK  Take 2 tabs (500 mg) PO on day # 1; and then take 1 tab (250 mg) PO QD till gone.     dexamethasone 4 MG tablet  Commonly known as:  DECADRON  TAKE 2 TABS WITH FOOD, ONCE A DAY ON THE DAY AFTER CHEMO, THEN TAKE 2 TABS TWICE DAILY FOR 2 DAYS,     HYDROcodone-homatropine 5-1.5 MG/5ML syrup  Commonly known as:  HYCODAN  Take 5 mLs by mouth every 6 (six) hours as needed for cough.     lidocaine-prilocaine cream  Commonly known as:  EMLA  Apply to affected area once     LORazepam 0.5 MG tablet  Commonly known as:  ATIVAN  TAKE 1 TABLET BY MOUTH EVERY 6 HOURS AS NEEDED FOR NAUSEA/VOMITING     ondansetron 8 MG tablet  Commonly known as:  ZOFRAN  Take 1 tablet (8 mg total) by mouth 2 (two) times daily as needed. Start on the third day after chemotherapy.     oxyCODONE-acetaminophen 5-325 MG per tablet  Commonly known as:  ROXICET  Take 1-2 tablets by mouth every 4 (four) hours as needed.     prochlorperazine 10 MG tablet  Commonly known as:  COMPAZINE  TAKE 1 TABLET (10 MG TOTAL) BY MOUTH EVERY 6 (SIX) HOURS AS NEEDED (NAUSEA OR VOMITING).         PHYSICAL EXAMINATION  BP 113/73, HR 122, temp 98.2, 100%  Physical Exam  Constitutional: She is oriented to person, place, and time and well-developed, well-nourished, and in no distress.  HENT:  Head: Normocephalic and atraumatic.  Mouth/Throat: Oropharynx is clear and moist.  Nasal congestion on exam.  No facial tenderness with palpation.   Oropharynx clear with no exudate.  Eyes: Conjunctivae and EOM are normal. Pupils are equal, round, and reactive to light. Right eye exhibits no discharge. Left eye exhibits no discharge. No scleral icterus.  Neck: Normal range of motion. Neck supple. No JVD present. No tracheal deviation present. No thyromegaly present.  Cardiovascular: Normal rate, regular rhythm, normal heart sounds and intact distal pulses.   Pulmonary/Chest: Effort normal and breath sounds normal. No respiratory distress. She has no wheezes. She has no rales. She exhibits no tenderness.  Occasional dry cough on exam.  No wheezing or respiratory distress.  Abdominal: Soft. Bowel sounds are normal. She exhibits no distension and no mass. There is no tenderness. There is no rebound and no guarding.  Musculoskeletal: Normal range of motion. She exhibits no edema or tenderness.  Lymphadenopathy:    She has no cervical adenopathy.  Neurological: She is alert and oriented to person, place, and time. Gait normal.  Skin: Skin is warm and dry. No rash noted. No erythema. No pallor.  Psychiatric: Affect normal.  Nursing note and vitals reviewed.   LABORATORY DATA:. Appointment on 11/24/2014  Component Date Value Ref Range Status  . WBC 11/24/2014  4.6  3.9 - 10.3 10e3/uL Final  . NEUT# 11/24/2014 3.8  1.5 - 6.5 10e3/uL Final  . HGB 11/24/2014 9.6* 11.6 - 15.9 g/dL Final  . HCT 11/24/2014 29.3* 34.8 - 46.6 % Final  . Platelets 11/24/2014 170  145 - 400 10e3/uL Final  . MCV 11/24/2014 86.9  79.5 - 101.0 fL Final  . MCH 11/24/2014 28.5  25.1 - 34.0 pg Final  . MCHC 11/24/2014 32.8  31.5 - 36.0 g/dL Final  . RBC 11/24/2014 3.37* 3.70 - 5.45 10e6/uL Final  . RDW 11/24/2014 18.2* 11.2 - 14.5 % Final  . lymph# 11/24/2014 0.6* 0.9 - 3.3 10e3/uL Final  . MONO# 11/24/2014 0.2  0.1 - 0.9 10e3/uL Final  . Eosinophils Absolute 11/24/2014 0.0  0.0 - 0.5 10e3/uL Final  . Basophils Absolute 11/24/2014 0.0  0.0 - 0.1 10e3/uL Final  . NEUT%  11/24/2014 82.9* 38.4 - 76.8 % Final  . LYMPH% 11/24/2014 12.7* 14.0 - 49.7 % Final  . MONO% 11/24/2014 3.7  0.0 - 14.0 % Final  . EOS% 11/24/2014 0.7  0.0 - 7.0 % Final  . BASO% 11/24/2014 0.0  0.0 - 2.0 % Final  . nRBC 11/24/2014 0  0 - 0 % Final  . Sodium 11/24/2014 140  136 - 145 mEq/L Final  . Potassium 11/24/2014 4.0  3.5 - 5.1 mEq/L Final  . Chloride 11/24/2014 101  98 - 109 mEq/L Final  . CO2 11/24/2014 32* 22 - 29 mEq/L Final  . Glucose 11/24/2014 127  70 - 140 mg/dl Final  . BUN 11/24/2014 14.8  7.0 - 26.0 mg/dL Final  . Creatinine 11/24/2014 0.8  0.6 - 1.1 mg/dL Final  . Total Bilirubin 11/24/2014 0.31  0.20 - 1.20 mg/dL Final  . Alkaline Phosphatase 11/24/2014 73  40 - 150 U/L Final  . AST 11/24/2014 21  5 - 34 U/L Final  . ALT 11/24/2014 23  0 - 55 U/L Final  . Total Protein 11/24/2014 6.2* 6.4 - 8.3 g/dL Final  . Albumin 11/24/2014 3.2* 3.5 - 5.0 g/dL Final  . Calcium 11/24/2014 9.4  8.4 - 10.4 mg/dL Final  . Anion Gap 11/24/2014 8  3 - 11 mEq/L Final  . EGFR 11/24/2014 >90  >90 ml/min/1.73 m2 Final   eGFR is calculated using the CKD-EPI Creatinine Equation (2009)     RADIOGRAPHIC STUDIES: No results found.  ASSESSMENT/PLAN:    Breast cancer of upper-outer quadrant of right female breast Patient is status post neoadjuvant dose dense Adriamycin/Cytoxan chemotherapy; and is currently undergoing weekly Taxol/carboplatin chemotherapy.  Patient received cycle 4 of her Taxol/carboplatin chemotherapy today.  Patient has plans to return for cycle 5 of the same regimen on 12/01/2014.   Bronchitis Patient noted to have URI symptoms which consist of nasal congestion and occasional headache.  No facial tenderness with palpation.  Oropharynx clear with no erythema or exudate.  Patient does have a dry, nonproductive cough on exam.  No wheezing or acute shortness of breath noted on exam.  Patient was afebrile.  Will prescribe patient Zithromax for mild bronchitis symptoms.  Patient  is also requesting cough syrup to help her sleep at night.  Patient was prescribed Hycodan cough syrup today as well.   Tachycardia Patient presented to the [facility] today with tachycardia at 128.  Blood pressure was stable at 104/68; with O2 sat 100% on room air.  Patient was afebrile with a temperature of 98.2.  Patient denies any chest pain, chest pressure, shortness of breath,  or pain with inspiration.  Patient does not appear dehydrated or toxic.  It is noted that patient's had documented tachycardia since 11/10/2014.  This could very well be a chemotherapy side effect.  EKG obtained today revealed a sinus tachycardia with a rate of 117; and a QTC of 438.  Tachycardic rate could also have increased due to mild bronchitis/URI symptoms.  Advised patient will continue to monitor closely.  Instructed patient to call/return of her directly to the emergency department if she develops any worsening symptoms whatsoever.   Patient stated understanding of all instructions; and was in agreement with this plan of care. The patient knows to call the clinic with any problems, questions or concerns.   Review/collaboration with Dr. Lindi Adie regarding all aspects of patient's visit today.   Total time spent with patient was 25 minutes;  with greater than 75 percent of that time spent in face to face counseling regarding patient's symptoms,  and coordination of care and follow up.  Disclaimer: This note was dictated with voice recognition software. Similar sounding words can inadvertently be transcribed and may not be corrected upon review.   Drue Second, NP 11/25/2014

## 2014-11-28 ENCOUNTER — Telehealth: Payer: Self-pay

## 2014-11-28 NOTE — Telephone Encounter (Signed)
lvm f/u on how z-pack and hycodan are doing for her URI. Call if you need to

## 2014-11-28 NOTE — Telephone Encounter (Signed)
Fax rcvd from [facility], after hours call dtd 11/25/14 5:10 pm.  Reviewed and sent to scan.

## 2014-11-29 ENCOUNTER — Inpatient Hospital Stay (HOSPITAL_COMMUNITY)
Admission: EM | Admit: 2014-11-29 | Discharge: 2014-12-03 | DRG: 175 | Disposition: A | Discharge status: Home / Self Care | Payer: 59 | Attending: Internal Medicine | Admitting: Internal Medicine

## 2014-11-29 ENCOUNTER — Encounter (HOSPITAL_COMMUNITY): Payer: Self-pay

## 2014-11-29 ENCOUNTER — Telehealth: Payer: Self-pay

## 2014-11-29 ENCOUNTER — Emergency Department (HOSPITAL_COMMUNITY): Payer: 59

## 2014-11-29 DIAGNOSIS — R000 Tachycardia, unspecified: Secondary | ICD-10-CM | POA: Diagnosis present

## 2014-11-29 DIAGNOSIS — E860 Dehydration: Secondary | ICD-10-CM | POA: Diagnosis present

## 2014-11-29 DIAGNOSIS — Z79899 Other long term (current) drug therapy: Secondary | ICD-10-CM

## 2014-11-29 DIAGNOSIS — R0602 Shortness of breath: Secondary | ICD-10-CM | POA: Diagnosis present

## 2014-11-29 DIAGNOSIS — R197 Diarrhea, unspecified: Secondary | ICD-10-CM | POA: Diagnosis present

## 2014-11-29 DIAGNOSIS — I82629 Acute embolism and thrombosis of deep veins of unspecified upper extremity: Secondary | ICD-10-CM | POA: Diagnosis present

## 2014-11-29 DIAGNOSIS — Z803 Family history of malignant neoplasm of breast: Secondary | ICD-10-CM

## 2014-11-29 DIAGNOSIS — T451X5A Adverse effect of antineoplastic and immunosuppressive drugs, initial encounter: Secondary | ICD-10-CM | POA: Diagnosis present

## 2014-11-29 DIAGNOSIS — I82621 Acute embolism and thrombosis of deep veins of right upper extremity: Secondary | ICD-10-CM | POA: Diagnosis present

## 2014-11-29 DIAGNOSIS — E8770 Fluid overload, unspecified: Secondary | ICD-10-CM | POA: Diagnosis present

## 2014-11-29 DIAGNOSIS — D6869 Other thrombophilia: Secondary | ICD-10-CM | POA: Diagnosis present

## 2014-11-29 DIAGNOSIS — I2699 Other pulmonary embolism without acute cor pulmonale: Principal | ICD-10-CM | POA: Diagnosis present

## 2014-11-29 DIAGNOSIS — C50411 Malignant neoplasm of upper-outer quadrant of right female breast: Secondary | ICD-10-CM | POA: Diagnosis present

## 2014-11-29 DIAGNOSIS — D709 Neutropenia, unspecified: Secondary | ICD-10-CM

## 2014-11-29 DIAGNOSIS — D61810 Antineoplastic chemotherapy induced pancytopenia: Secondary | ICD-10-CM | POA: Diagnosis present

## 2014-11-29 LAB — BASIC METABOLIC PANEL
Anion gap: 9 (ref 5–15)
BUN: 22 mg/dL (ref 6–23)
CHLORIDE: 96 mmol/L (ref 96–112)
CO2: 27 mmol/L (ref 19–32)
Calcium: 9.2 mg/dL (ref 8.4–10.5)
Creatinine, Ser: 0.79 mg/dL (ref 0.50–1.10)
GFR calc Af Amer: >90 mL/min (ref >90)
GFR calc non Af Amer: >90 mL/min (ref >90)
Glucose, Bld: 155 mg/dL — ABNORMAL HIGH (ref 70–99)
Potassium: 3.3 mmol/L — ABNORMAL LOW (ref 3.5–5.1)
Sodium: 132 mmol/L — ABNORMAL LOW (ref 135–145)

## 2014-11-29 LAB — CBC WITH DIFFERENTIAL/PLATELET
BASOS PCT: 0 % (ref 0–1)
Basophils Absolute: 0 10*3/uL (ref 0.0–0.1)
Eosinophils Absolute: 0 10*3/uL (ref 0.0–0.7)
Eosinophils Relative: 0 % (ref 0–5)
HCT: 30.4 % — ABNORMAL LOW (ref 36.0–46.0)
Hemoglobin: 9.9 g/dL — ABNORMAL LOW (ref 12.0–15.0)
Lymphocytes Relative: 43 % (ref 12–46)
Lymphs Abs: 0.6 10*3/uL — ABNORMAL LOW (ref 0.7–4.0)
MCH: 28.8 pg (ref 26.0–34.0)
MCHC: 32.6 g/dL (ref 30.0–36.0)
MCV: 88.4 fL (ref 78.0–100.0)
MONO ABS: 0.1 10*3/uL (ref 0.1–1.0)
Monocytes Relative: 4 % (ref 3–12)
Neutro Abs: 0.7 10*3/uL — ABNORMAL LOW (ref 1.7–7.7)
Neutrophils Relative %: 53 % (ref 43–77)
PLATELETS: 124 10*3/uL — AB (ref 150–400)
RBC: 3.44 MIL/uL — ABNORMAL LOW (ref 3.87–5.11)
RDW: 18.1 % — ABNORMAL HIGH (ref 11.5–15.5)
WBC: 1.4 10*3/uL — CL (ref 4.0–10.5)

## 2014-11-29 LAB — URINE MICROSCOPIC-ADD ON

## 2014-11-29 LAB — URINALYSIS, ROUTINE W REFLEX MICROSCOPIC
Bilirubin Urine: NEGATIVE
Glucose, UA: NEGATIVE mg/dL
Ketones, ur: NEGATIVE mg/dL
Nitrite: NEGATIVE
PROTEIN: NEGATIVE mg/dL
Specific Gravity, Urine: 1.015 (ref 1.005–1.030)
UROBILINOGEN UA: 0.2 mg/dL (ref 0.0–1.0)
pH: 5 (ref 5.0–8.0)

## 2014-11-29 LAB — PHOSPHORUS: PHOSPHORUS: 3.2 mg/dL (ref 2.3–4.6)

## 2014-11-29 LAB — MAGNESIUM: MAGNESIUM: 1.7 mg/dL (ref 1.5–2.5)

## 2014-11-29 MED ORDER — SODIUM CHLORIDE 0.9 % IV SOLN
1000.0000 mL | Freq: Once | INTRAVENOUS | Status: AC
  Administered 2014-11-29: 1000 mL via INTRAVENOUS

## 2014-11-29 MED ORDER — HYDROCODONE-ACETAMINOPHEN 5-325 MG PO TABS
1.0000 | ORAL_TABLET | ORAL | Status: DC | PRN
  Administered 2014-11-29 – 2014-11-30 (×2): 1 via ORAL
  Administered 2014-12-02: 2 via ORAL
  Administered 2014-12-02: 1 via ORAL
  Filled 2014-11-29 (×2): qty 1
  Filled 2014-11-29: qty 2
  Filled 2014-11-29: qty 1

## 2014-11-29 MED ORDER — IOHEXOL 350 MG/ML SOLN
100.0000 mL | Freq: Once | INTRAVENOUS | Status: AC | PRN
  Administered 2014-11-29: 100 mL via INTRAVENOUS

## 2014-11-29 MED ORDER — ONDANSETRON HCL 4 MG/2ML IJ SOLN
4.0000 mg | Freq: Four times a day (QID) | INTRAMUSCULAR | Status: DC | PRN
  Administered 2014-11-30: 4 mg via INTRAVENOUS
  Filled 2014-11-29: qty 2

## 2014-11-29 MED ORDER — ALPRAZOLAM 0.25 MG PO TABS
0.2500 mg | ORAL_TABLET | Freq: Two times a day (BID) | ORAL | Status: DC | PRN
  Administered 2014-11-29 – 2014-12-01 (×2): 0.25 mg via ORAL
  Filled 2014-11-29 (×2): qty 1

## 2014-11-29 MED ORDER — SODIUM CHLORIDE 0.9 % IJ SOLN
3.0000 mL | Freq: Two times a day (BID) | INTRAMUSCULAR | Status: DC
  Administered 2014-11-30 – 2014-12-03 (×6): 3 mL via INTRAVENOUS

## 2014-11-29 MED ORDER — SODIUM CHLORIDE 0.9 % IV SOLN
INTRAVENOUS | Status: DC
  Administered 2014-11-29 – 2014-11-30 (×3): via INTRAVENOUS

## 2014-11-29 MED ORDER — SODIUM CHLORIDE 0.9 % IJ SOLN
INTRAMUSCULAR | Status: AC
  Administered 2014-11-29: 02:00:00
  Filled 2014-11-29: qty 10

## 2014-11-29 MED ORDER — ENOXAPARIN SODIUM 100 MG/ML ~~LOC~~ SOLN
1.0000 mg/kg | Freq: Once | SUBCUTANEOUS | Status: AC
  Administered 2014-11-29: 95 mg via SUBCUTANEOUS
  Filled 2014-11-29: qty 1

## 2014-11-29 MED ORDER — ONDANSETRON HCL 4 MG PO TABS: 4.0000 mg | ORAL_TABLET | Freq: Four times a day (QID) | ORAL | Status: DC | PRN

## 2014-11-29 MED ORDER — ENOXAPARIN SODIUM 100 MG/ML ~~LOC~~ SOLN
1.0000 mg/kg | Freq: Two times a day (BID) | SUBCUTANEOUS | Status: DC
  Administered 2014-11-29 – 2014-11-30 (×3): 95 mg via SUBCUTANEOUS
  Filled 2014-11-29 (×4): qty 1

## 2014-11-29 MED ORDER — ENOXAPARIN SODIUM 100 MG/ML ~~LOC~~ SOLN
1.0000 mg/kg | Freq: Once | SUBCUTANEOUS | Status: DC
  Filled 2014-11-29: qty 1

## 2014-11-29 MED ORDER — SODIUM CHLORIDE 0.9 % IV SOLN
1000.0000 mL | INTRAVENOUS | Status: DC
  Administered 2014-11-29: 1000 mL via INTRAVENOUS

## 2014-11-29 NOTE — ED Notes (Signed)
IV team at bedside 

## 2014-11-29 NOTE — ED Notes (Signed)
CT called

## 2014-11-29 NOTE — H&P (Addendum)
[facility] History and Physical  Jamie Edwards YQM:[id number] DOB: 15-Feb-1971 DOA: 11/29/2014  Referring physician: Dr. Sharol Given PCP: Osborne Casco, MD   Chief Complaint: weakness and SOB  HPI: Jamie Edwards is a 44 y.o. female  With history of breast cancer started a new chemotherapy treatment within the last week. States that afterwards she developed some upper respiratory symptoms of which she was prescribed azithromycin. After taken medication she felt that it was given her increased bouts of diarrhea. Yesterday she reports feeling lightheaded and with generalized weakness. As a result she presented to the hospital for further evaluation recommendations.  Patient was also complaining of shortness of breath in that it felt like she could not catch her breath at home. Further evaluation with CT angiogram would reveal new diagnosis of pulmonary embolus   Review of Systems:  Constitutional:  No weight loss, night sweats, Fevers, chills, fatigue.  HEENT:  No headaches, Difficulty swallowing,Tooth/dental problems,Sore throat,  No sneezing, itching, ear ache, nasal congestion, post nasal drip,  Cardio-vascular:  No chest pain, Orthopnea, PND, swelling in lower extremities, anasarca, dizziness, palpitations  GI:  No heartburn, indigestion, abdominal pain, nausea, vomiting, diarrhea, change in bowel habits, loss of appetite  Resp:  + shortness of breath with exertion or at rest. No excess mucus, no productive cough, + non-productive cough, No coughing up of blood.No change in color of mucus.No wheezing.No chest wall deformity  Skin:  no rash or lesions.  GU:  no dysuria, change in color of urine, no urgency or frequency. No flank pain.  Musculoskeletal:  No joint pain or swelling. No decreased range of motion. No back pain.  Psych:  No change in mood or affect. No depression or anxiety. No memory loss.   Past Medical History  Diagnosis Date  . Medical history  non-contributory   . Anxiety   . Breast cancer 08/2014    rt.breast ca   Past Surgical History  Procedure Laterality Date  . Mouth surgery      implants  . Portacath placement Right 08/31/2014    Procedure: INSERTION PORT-A-CATH WITH ULTRA SOUND GUIDENCE;  Surgeon: Erroll Luna, MD;  Location: [facility];  Service: General;  Laterality: Right;   Social History:  reports that she has never smoked. She does not have any smokeless tobacco history on file. She reports that she does not drink alcohol or use illicit drugs.  Allergies  Allergen Reactions  . Penicillins Other (See Comments)    Stomach upset  . Zithromax [Azithromycin] Diarrhea    Family History  Problem Relation Age of Onset  . Breast cancer Mother 10  . Prostate cancer Father 34  . Esophageal cancer Paternal Uncle     smoker  . Breast cancer Cousin      Prior to Admission medications   Medication Sig Start Date End Date Taking? Authorizing Provider  ALPRAZolam (XANAX) 0.25 MG tablet Take 0.25 mg by mouth 2 (two) times daily as needed for anxiety.   Yes Historical Provider, MD  dexamethasone (DECADRON) 4 MG tablet TAKE 2 TABS WITH FOOD, ONCE A DAY ON THE DAY AFTER CHEMO, THEN TAKE 2 TABS TWICE DAILY FOR 2 DAYS, 11/16/14  Yes Rulon Eisenmenger, MD  HYDROcodone-homatropine (HYCODAN) 5-1.5 MG/5ML syrup Take 5 mLs by mouth every 6 (six) hours as needed for cough. 11/24/14  Yes Drue Second, NP  lidocaine-prilocaine (EMLA) cream Apply to affected area once 08/31/14  Yes Rulon Eisenmenger, MD  LORazepam (ATIVAN) 0.5 MG tablet  TAKE 1 TABLET BY MOUTH EVERY 6 HOURS AS NEEDED FOR NAUSEA/VOMITING 11/18/14  Yes Rulon Eisenmenger, MD  ondansetron (ZOFRAN) 8 MG tablet Take 1 tablet (8 mg total) by mouth 2 (two) times daily as needed. Start on the third day after chemotherapy. 08/31/14  Yes Rulon Eisenmenger, MD  oxyCODONE-acetaminophen (ROXICET) 5-325 MG per tablet Take 1-2 tablets by mouth every 4 (four) hours as needed. Patient  taking differently: Take 1-2 tablets by mouth every 4 (four) hours as needed for moderate pain.  08/31/14  Yes Erroll Luna, MD  PRESCRIPTION MEDICATION every 7 (seven) days. Chemotherapy at [facility]) every 7 days.   Yes Historical Provider, MD  prochlorperazine (COMPAZINE) 10 MG tablet TAKE 1 TABLET (10 MG TOTAL) BY MOUTH EVERY 6 (SIX) HOURS AS NEEDED (NAUSEA OR VOMITING). 11/16/14  Yes Rulon Eisenmenger, MD  azithromycin (ZITHROMAX Z-PAK) 250 MG tablet Take 2 tabs (500 mg) PO on day # 1; and then take 1 tab (250 mg) PO QD till gone. 11/24/14   Drue Second, NP   Physical Exam: Filed Vitals:   11/29/14 0715 11/29/14 1610 11/29/14 0928 11/29/14 0946  BP:  115/60 103/54 131/77  Pulse:  108 112 120  Temp:  98.1 F (36.7 C)  99 F (37.2 C)  TempSrc:  Oral  Oral  Resp: 20 18  22   Height:    5\' 8"  (1.727 m)  Weight:    93.396 kg (205 lb 14.4 oz)  SpO2:  100% 100% 100%    Wt Readings from Last 3 Encounters:  11/29/14 93.396 kg (205 lb 14.4 oz)  11/09/14 93.214 kg (205 lb 8 oz)  11/03/14 93.804 kg (206 lb 12.8 oz)    General:  Appears calm and comfortable Eyes: PERRL, normal lids, irises & conjunctiva ENT: grossly normal hearing, lips & tongue Neck: no LAD, masses or thyromegaly Cardiovascular: S1 and S2 wnl, no m/r/g.  Respiratory: equal chest rise, speaking in full sentencess, no wheezes Abdomen: soft, nt, nd Skin: no rash or induration seen on limited exam Musculoskeletal: grossly normal tone BUE/BLE Psychiatric: grossly normal mood and affect, speech fluent and appropriate Neurologic: grossly non-focal.          Labs on Admission:  Basic Metabolic Panel:  Recent Labs Lab 11/24/14 1000 11/29/14 0231  NA 140 132*  K 4.0 3.3*  CL  --  96  CO2 32* 27  GLUCOSE 127 155*  BUN 14.8 22  CREATININE 0.8 0.79  CALCIUM 9.4 9.2   Liver Function Tests:  Recent Labs Lab 11/24/14 1000  AST 21  ALT 23  ALKPHOS 73  BILITOT 0.31  PROT 6.2*  ALBUMIN 3.2*   No results  for input(s): LIPASE, AMYLASE in the last 168 hours. No results for input(s): AMMONIA in the last 168 hours. CBC:  Recent Labs Lab 11/24/14 1000 11/29/14 0231  WBC 4.6 1.4*  NEUTROABS 3.8 0.7*  HGB 9.6* 9.9*  HCT 29.3* 30.4*  MCV 86.9 88.4  PLT 170 124*   Cardiac Enzymes: No results for input(s): CKTOTAL, CKMB, CKMBINDEX, TROPONINI in the last 168 hours.  BNP (last 3 results) No results for input(s): BNP in the last 8760 hours.  ProBNP (last 3 results) No results for input(s): PROBNP in the last 8760 hours.  CBG: No results for input(s): GLUCAP in the last 168 hours.  Radiological Exams on Admission: Ct Angio Chest Pe W/cm &/or Wo Cm  11/29/2014   CLINICAL DATA:  Breast carcinoma. Tachycardia and difficulty breathing  EXAM: CT ANGIOGRAPHY CHEST WITH CONTRAST  TECHNIQUE: Multidetector CT imaging of the chest was performed using the standard protocol during bolus administration of intravenous contrast. Multiplanar CT image reconstructions and MIPs were obtained to evaluate the vascular anatomy.  CONTRAST:  170mL OMNIPAQUE IOHEXOL 350 MG/ML SOLN  COMPARISON:  Chest radiograph August 31, 2014 and chest CT September 02, 2014  FINDINGS: There is pulmonary embolus in the superior segment right lower lobe pulmonary arterial branch. No more central pulmonary emboli are identified. There is no demonstrable right heart strain.  There is no thoracic aortic aneurysm or dissection.  There is dependent atelectasis in both lower lobes posteriorly. There is no edema or consolidation. On axial slice 47 series 11, there is a 5 mm nodular opacity in the lateral segment of the right middle lobe. No other parenchymal lung nodular opacities are identified.  Port-A-Cath tip is in the superior vena cava. There is no appreciable thoracic adenopathy. The pericardium is not thickened. Thyroid appears unremarkable.  In the visualized upper abdomen, no focal lesion is appreciable.  There are no blastic or lytic bone  lesions.  Review of the MIP images confirms the above findings.  IMPRESSION: Pulmonary embolus in the superior segment right lower lobe pulmonary artery. No right heart strain.  No lung edema or consolidation. There is a 5 mm nodular opacity in the lateral segment right middle lobe which must be concerning for small metastatic focus given known breast carcinoma. This nodular opacity was not present on recent prior study.  No appreciable adenopathy.  Critical Value/emergent results were called by telephone at the time of interpretation on 11/29/2014 at 8:24 am to Dr. Colin Rhein, ED , who verbally acknowledged these results.   Electronically Signed   By: Lowella Grip III M.D.   On: 11/29/2014 08:25    EKG: Independently reviewed. Sinus tachycardia with no ST elevations or depressions  Assessment/Plan Active Problems:   Pulmonary embolism - Pharmacy consult for appropriate Lovenox dose - Most likely secondary to hypercoagulable state from breast cancer - We'll continue supportive therapy - addendum: will assess for heart strain and obtain echocardiogram  Breast cancer - Patient to follow-up with oncologist after discharge   Code Status: full code DVT Prophylaxis: Pt on Lovenox  Family Communication: d/c patient at bedside and spouse Disposition Plan: Telemetry monitoring  Time spent: > 55 minutes  Velvet Bathe [facility] Pager [phone]

## 2014-11-29 NOTE — Progress Notes (Signed)
Patient's HR was tachycardic. Rate 130 s-155.  Patient was sitting in bed. PCP on call was notified.

## 2014-11-29 NOTE — Progress Notes (Signed)
ANTICOAGULATION CONSULT NOTE - Initial Consult  Pharmacy Consult for enoxaparin Indication: VTE treatment  Allergies  Allergen Reactions  . Penicillins Other (See Comments)    Stomach upset  . Zithromax [Azithromycin] Diarrhea    Patient Measurements: Height: 5\' 8"  (172.7 cm) Weight: 205 lb 14.4 oz (93.396 kg) IBW/kg (Calculated) : 63.9  Vital Signs: Temp: 99 F (37.2 C) (02/23 0946) Temp Source: Oral (02/23 0946) BP: 131/77 mmHg (02/23 0946) Pulse Rate: 120 (02/23 0946)  Labs:  Recent Labs  11/29/14 0231  HGB 9.9*  HCT 30.4*  PLT 124*  CREATININE 0.79    Estimated Creatinine Clearance: 109.5 mL/min (by C-G formula based on Cr of 0.79).   Medical History: Past Medical History  Diagnosis Date  . Medical history non-contributory   . Anxiety   . Breast cancer 08/2014    rt.breast ca    Assessment: 44 y.o. Female with history of breast cancer started a new chemotherapy treatment on 2/18. States that afterwards she developed some upper respiratory symptoms of which she was prescribed azithromycin. Patient was also complaining of shortness of breath in that it felt like she could not catch her breath at home. Further evaluation with CT angiogram reveals new diagnosis of pulmonary embolus.  Pharmacy consulted to dose enoxaparin  Today: Hgb 9.9 Hct 30.4 Plts 124 Scr 0.79, CrCl >153mls/min  Goal of Therapy:  Anti-Xa level 0.6-1 units/ml 4 hours after LMWH dose given Monitor platelets by anticoagulation protocol  Plan:   Enoxaparin 95mg  (1mg /kg) sq q12h  Anti-Xa level 4 hours after dose at steady state  CBC q72h  Follow renal function  Dolly Rias RPh 11/29/2014, 11:10 AM Pager [phone]

## 2014-11-29 NOTE — ED Provider Notes (Signed)
CSN: [id number]     Arrival date & time 11/29/14  0124 History   First MD Initiated Contact with Patient 11/29/14 0205     Chief Complaint  Patient presents with  . Dizziness     (Consider location/radiation/quality/duration/timing/severity/associated sxs/prior Treatment) HPI 44 year old female presents to emergency department from home with complaint of dizziness and diarrhea, nausea.  Patient has history of breast cancer, currently ongoing chemotherapy.  Patient reports on Thursday she started a new chemotherapy regimen.  She was told that she had a mild bronchitis and was started on Z-Pak.  On Saturday, patient reports she had onset of generalized weakness, nausea and diarrhea.  Patient has had persistent diarrhea since that time.  She denies any fever or chills.  She reports mild crampy abdominal pain.  Patient feels that the Z-Pak has caused her symptoms.  Patient reports mild shortness of breath.  She reports at times she feels that she is having to work harder to breathe.  She denies any chest pain.  Patient also complains of dull headache.  Patient arrives with elevated heart rate in the 150s.  Per clinic note, she was running in the 120s at her most recent evaluation for chemotherapy. Past Medical History  Diagnosis Date  . Breast cancer   . Medical history non-contributory   . Anxiety    Past Surgical History  Procedure Laterality Date  . Mouth surgery      implants  . Portacath placement Right 08/31/2014    Procedure: INSERTION PORT-A-CATH WITH ULTRA SOUND GUIDENCE;  Surgeon: Erroll Luna, MD;  Location: [place];  Service: General;  Laterality: Right;   Family History  Problem Relation Age of Onset  . Breast cancer Mother 31  . Prostate cancer Father 64  . Esophageal cancer Paternal Uncle     smoker  . Breast cancer Cousin    History  Substance Use Topics  . Smoking status: Never Smoker   . Smokeless tobacco: Not on file  . Alcohol Use: No   OB  History    No data available     Review of Systems  See History of Present Illness; otherwise all other systems are reviewed and negative   Allergies  Penicillins and Zithromax  Home Medications   Prior to Admission medications   Medication Sig Start Date End Date Taking? Authorizing Provider  ALPRAZolam (XANAX) 0.25 MG tablet Take 0.25 mg by mouth 2 (two) times daily as needed for anxiety.   Yes Historical Provider, MD  dexamethasone (DECADRON) 4 MG tablet TAKE 2 TABS WITH FOOD, ONCE A DAY ON THE DAY AFTER CHEMO, THEN TAKE 2 TABS TWICE DAILY FOR 2 DAYS, 11/16/14  Yes Rulon Eisenmenger, MD  HYDROcodone-homatropine (HYCODAN) 5-1.5 MG/5ML syrup Take 5 mLs by mouth every 6 (six) hours as needed for cough. 11/24/14  Yes Drue Second, NP  lidocaine-prilocaine (EMLA) cream Apply to affected area once 08/31/14  Yes Rulon Eisenmenger, MD  LORazepam (ATIVAN) 0.5 MG tablet TAKE 1 TABLET BY MOUTH EVERY 6 HOURS AS NEEDED FOR NAUSEA/VOMITING 11/18/14  Yes Rulon Eisenmenger, MD  ondansetron (ZOFRAN) 8 MG tablet Take 1 tablet (8 mg total) by mouth 2 (two) times daily as needed. Start on the third day after chemotherapy. 08/31/14  Yes Rulon Eisenmenger, MD  oxyCODONE-acetaminophen (ROXICET) 5-325 MG per tablet Take 1-2 tablets by mouth every 4 (four) hours as needed. Patient taking differently: Take 1-2 tablets by mouth every 4 (four) hours as needed for moderate pain.  08/31/14  Yes Erroll Luna, MD  PRESCRIPTION MEDICATION every 7 (seven) days. Chemotherapy at [facility]) every 7 days.   Yes Historical Provider, MD  prochlorperazine (COMPAZINE) 10 MG tablet TAKE 1 TABLET (10 MG TOTAL) BY MOUTH EVERY 6 (SIX) HOURS AS NEEDED (NAUSEA OR VOMITING). 11/16/14  Yes Rulon Eisenmenger, MD  azithromycin (ZITHROMAX Z-PAK) 250 MG tablet Take 2 tabs (500 mg) PO on day # 1; and then take 1 tab (250 mg) PO QD till gone. 11/24/14   Drue Second, NP   BP 98/63 mmHg  Pulse 152  Temp(Src) 98.8 F (37.1 C) (Oral)  Resp 23  SpO2  100% Physical Exam  Constitutional: She is oriented to person, place, and time. She appears well-developed and well-nourished. She appears distressed.  HENT:  Head: Normocephalic and atraumatic.  Nose: Nose normal.  Mouth/Throat: Oropharynx is clear and moist.  Dry mucous membranes  Eyes: Conjunctivae and EOM are normal. Pupils are equal, round, and reactive to light.  Neck: Normal range of motion. Neck supple. No JVD present. No tracheal deviation present. No thyromegaly present.  Cardiovascular: Normal rate, regular rhythm, normal heart sounds and intact distal pulses.  Exam reveals no gallop and no friction rub.   No murmur heard. Tachycardia noted  Pulmonary/Chest: Effort normal and breath sounds normal. No stridor. No respiratory distress. She has no wheezes. She has no rales. She exhibits no tenderness.  Abdominal: Soft. She exhibits no distension and no mass. There is no tenderness. There is no rebound and no guarding.  Hyperactive bowel sounds  Musculoskeletal: Normal range of motion. She exhibits no edema or tenderness.  Lymphadenopathy:    She has no cervical adenopathy.  Neurological: She is alert and oriented to person, place, and time. She displays normal reflexes. She exhibits normal muscle tone. Coordination normal.  Skin: Skin is warm and dry. No rash noted. No erythema. No pallor.  Psychiatric: She has a normal mood and affect. Her behavior is normal. Judgment and thought content normal.  Nursing note and vitals reviewed.   ED Course  Procedures (including critical care time) Labs Review Labs Reviewed  BASIC METABOLIC PANEL - Abnormal; Notable for the following:    Sodium 132 (*)    Potassium 3.3 (*)    Glucose, Bld 155 (*)    All other components within normal limits  CBC WITH DIFFERENTIAL/PLATELET - Abnormal; Notable for the following:    WBC 1.4 (*)    RBC 3.44 (*)    Hemoglobin 9.9 (*)    HCT 30.4 (*)    RDW 18.1 (*)    Platelets 124 (*)    Neutro Abs 0.7  (*)    Lymphs Abs 0.6 (*)    All other components within normal limits  URINALYSIS, ROUTINE W REFLEX MICROSCOPIC - Abnormal; Notable for the following:    Hgb urine dipstick SMALL (*)    Leukocytes, UA TRACE (*)    All other components within normal limits  URINE MICROSCOPIC-ADD ON - Abnormal; Notable for the following:    Bacteria, UA FEW (*)    All other components within normal limits    Imaging Review No results found.   EKG Interpretation   Date/Time:  Tuesday November 29 2014 01:38:08 EST Ventricular Rate:  141 PR Interval:  119 QRS Duration: 63 QT Interval:  367 QTC Calculation: 562 R Axis:   57 Text Interpretation:  Sinus tachycardia Low voltage, precordial leads  Prolonged QT interval Since last tracing rate faster Confirmed by Hayes Czaja  MD, Michelene Heady ([id number]) on 11/29/2014 2:18:05 AM      MDM   Final diagnoses:  Tachycardia with 141 - 160 beats per minute  Neutropenia  Diarrhea    44 year old female with weakness, tachycardia, diarrhea since Saturday.  Recent change in chemotherapy drug, also recently on Z-Pak.  Patient to receive IV fluids, labs.  If tachycardia persists after fluids.  She may need further evaluation for possible PE given her complaint of shortness of breath.  Labs show neutropenia.  Patient denies any fevers.  Her heart rate has not responded appropriately to 2 L of fluids, when sitting up or standing.  She is still extremely weak and tachycardic.  Patient to receive CT angioma of the chest to rule out PE.  I feel she will most likely need admission to the hospital for dehydration and persistent tachycardia.  7:08 AM Care passed to Dr. Colin Rhein awaiting CT angio chest.       Kalman Drape, MD 11/29/14 [phone]

## 2014-11-29 NOTE — ED Notes (Signed)
Pt started a new chemo Thursday and has been weak, dizzy and nauseated since then

## 2014-11-29 NOTE — Telephone Encounter (Signed)
Call report 11/29/14 2:55 am - pt in the ED.  MD notified.  Sent to scan.

## 2014-11-29 NOTE — Progress Notes (Signed)
  Echocardiogram 2D Echocardiogram has been performed.  Diamond Nickel 11/29/2014, 12:47 PM

## 2014-11-29 NOTE — ED Notes (Signed)
Patient transported to CT 

## 2014-11-29 NOTE — Progress Notes (Signed)
Pt assisted up to the bathroom. Blood noted to left side of toilet seat. Pt assessed and wound to left buttocks oozing bright red blood. Pt states she has wound for a lone time and scabs will form and then fall off area. Main area measured 7 x 4 1/2 with 3 small areas noted to area. Wound base to all red and oozing is from larger area. Area cleaned and dressing placed.

## 2014-11-29 NOTE — ED Provider Notes (Signed)
  Physical Exam  BP 100/63 mmHg  Pulse 107  Temp(Src) 98.2 F (36.8 C) (Oral)  Resp 18  SpO2 100%  Physical Exam  ED Course  Procedures  MDM Assumed care of pt from Dr. Sharol Given.  Pt presents with weakness, dizziness in setting of recent bronchitis.  Sent home on azithromycin, developed diarrhea.  Prior to assumption of care pt received 2L NS.  On assumption of care awaiting access for CT angio.    CT scan positive for PE.  Consulted medicine for admission.  Debby Freiberg, MD 11/30/14 [phone]

## 2014-11-30 ENCOUNTER — Other Ambulatory Visit: Payer: Self-pay | Admitting: *Deleted

## 2014-11-30 DIAGNOSIS — C50411 Malignant neoplasm of upper-outer quadrant of right female breast: Secondary | ICD-10-CM

## 2014-11-30 DIAGNOSIS — T451X5A Adverse effect of antineoplastic and immunosuppressive drugs, initial encounter: Secondary | ICD-10-CM

## 2014-11-30 DIAGNOSIS — R000 Tachycardia, unspecified: Secondary | ICD-10-CM

## 2014-11-30 DIAGNOSIS — D61810 Antineoplastic chemotherapy induced pancytopenia: Secondary | ICD-10-CM | POA: Diagnosis present

## 2014-11-30 LAB — BASIC METABOLIC PANEL
Anion gap: 11 (ref 5–15)
BUN: 15 mg/dL (ref 6–23)
CALCIUM: 8.8 mg/dL (ref 8.4–10.5)
CHLORIDE: 99 mmol/L (ref 96–112)
CO2: 27 mmol/L (ref 19–32)
Creatinine, Ser: 0.62 mg/dL (ref 0.50–1.10)
GFR calc Af Amer: >90 mL/min (ref >90)
GFR calc non Af Amer: >90 mL/min (ref >90)
GLUCOSE: 109 mg/dL — AB (ref 70–99)
Potassium: 3 mmol/L — ABNORMAL LOW (ref 3.5–5.1)
Sodium: 137 mmol/L (ref 135–145)

## 2014-11-30 LAB — CBC
HCT: 25.4 % — ABNORMAL LOW (ref 36.0–46.0)
Hemoglobin: 8.3 g/dL — ABNORMAL LOW (ref 12.0–15.0)
MCH: 28.9 pg (ref 26.0–34.0)
MCHC: 32.7 g/dL (ref 30.0–36.0)
MCV: 88.5 fL (ref 78.0–100.0)
Platelets: 102 10*3/uL — ABNORMAL LOW (ref 150–400)
RBC: 2.87 MIL/uL — AB (ref 3.87–5.11)
RDW: 18.5 % — ABNORMAL HIGH (ref 11.5–15.5)
WBC: 0.7 10*3/uL — CL (ref 4.0–10.5)

## 2014-11-30 MED ORDER — POTASSIUM CHLORIDE CRYS ER 20 MEQ PO TBCR
40.0000 meq | EXTENDED_RELEASE_TABLET | Freq: Once | ORAL | Status: AC
  Administered 2014-11-30: 40 meq via ORAL
  Filled 2014-11-30: qty 2

## 2014-11-30 NOTE — Progress Notes (Signed)
CARE MANAGEMENT NOTE 11/30/2014  Patient:  Jamie Edwards, Jamie Edwards   Account Number:  [id number]  Date Initiated:  11/30/2014  Documentation initiated by:  Karl Bales  Subjective/Objective Assessment:   pt admitted with SOB, Pulmonary embolism related to Chemo     Action/Plan:   from home   Anticipated DC Date:  12/03/2014   Anticipated DC Plan:  [place]  CM consult      Choice offered to / List presented to:             Status of service:  In process, will continue to follow Medicare Important Message given?   (If response is "NO", the following Medicare IM given date fields will be blank) Date Medicare IM given:   Medicare IM given by:   Date Additional Medicare IM given:   Additional Medicare IM given by:    Discharge Disposition:    Per UR Regulation:  Reviewed for med. necessity/level of care/duration of stay  If discussed at [place] of Stay Meetings, dates discussed:    Comments:  11/30/14 MMcGibboney, RN, BSN Chart reviewed.

## 2014-11-30 NOTE — Progress Notes (Signed)
PROGRESS NOTE  Jamie Edwards NWG:[id number] DOB: [birth year] DOA: 11/29/2014 PCP: Osborne Casco, MD  HPI: 44 y.o. female with history of breast cancer started a new chemotherapy treatment within the last week. States that afterwards she developed some upper respiratory symptoms of which she was prescribed azithromycin. After taken medication she felt that it was given her increased bouts of diarrhea. Yesterday she reports feeling lightheaded and with generalized weakness. As a result she presented to the hospital for further evaluation recommendations. Patient was also complaining of shortness of breath in that it felt like she could not catch her breath at home. Further evaluation with CT angiogram would reveal new diagnosis of pulmonary embolus  Subjective / 24 H Interval events - She is feeling well this morning, denies any shortness of breath  Assessment/Plan: Principal Problem:   Pulmonary embolism Active Problems:   Breast cancer of upper-outer quadrant of right female breast   Tachycardia   Antineoplastic chemotherapy induced pancytopenia  PE - CT scan positive for pulmonary embolism, she has a history of breast cancer currently undergoing treatment - She was started on Lovenox, continue - CT scan without evidence of right heart strain, 2-D echo as below and fairly unremarkable  Breast cancer - she is currently undergoing chemotherapy, I discussed with her primary oncologist this morning - Last chemotherapy was 6 days ago, scheduled for another one tomorrow, probably needs to be held  Sinus tachycardia - likely due to #1, no chest pain/palpitations and is otherwise asymptomatic  Pancytopenia - due to ongoing chemotherapy,  - she is neutropenic, closely watch for fevers,  - will monitor CBC, no need for transfusions right now,  - platelets trending down closely monitor for signs of bleeding as she is anticoagulated    Diet: Diet Heart Fluids: NS DVT  Prophylaxis: Lovenox  Code Status: Full Code Family Communication: d/w patient and friend over the phone  Disposition Plan: remain inpatient  Consultants:  Oncology - phone, Dr. Lindi Adie  Procedures:  2D echo Study Conclusions - Left ventricle: The cavity size was normal. Wall thickness was normal. Systolic function was normal. The estimated ejection fraction was in the range of 55% to 65%. Mitral valve: There was mild regurgitation.   Antibiotics  Anti-infectives    None       Studies  Ct Angio Chest Pe W/cm &/or Wo Cm  11/29/2014   CLINICAL DATA:  Breast carcinoma. Tachycardia and difficulty breathing  EXAM: CT ANGIOGRAPHY CHEST WITH CONTRAST  TECHNIQUE: Multidetector CT imaging of the chest was performed using the standard protocol during bolus administration of intravenous contrast. Multiplanar CT image reconstructions and MIPs were obtained to evaluate the vascular anatomy.  CONTRAST:  125mL OMNIPAQUE IOHEXOL 350 MG/ML SOLN  COMPARISON:  Chest radiograph August 31, 2014 and chest CT September 02, 2014  FINDINGS: There is pulmonary embolus in the superior segment right lower lobe pulmonary arterial branch. No more central pulmonary emboli are identified. There is no demonstrable right heart strain.  There is no thoracic aortic aneurysm or dissection.  There is dependent atelectasis in both lower lobes posteriorly. There is no edema or consolidation. On axial slice 47 series 11, there is a 5 mm nodular opacity in the lateral segment of the right middle lobe. No other parenchymal lung nodular opacities are identified.  Port-A-Cath tip is in the superior vena cava. There is no appreciable thoracic adenopathy. The pericardium is not thickened. Thyroid appears unremarkable.  In the visualized upper abdomen, no focal lesion is appreciable.  There are no blastic or lytic bone lesions.  Review of the MIP images confirms the above findings.  IMPRESSION: Pulmonary embolus in the superior segment  right lower lobe pulmonary artery. No right heart strain.  No lung edema or consolidation. There is a 5 mm nodular opacity in the lateral segment right middle lobe which must be concerning for small metastatic focus given known breast carcinoma. This nodular opacity was not present on recent prior study.  No appreciable adenopathy.  Critical Value/emergent results were called by telephone at the time of interpretation on 11/29/2014 at 8:24 am to Dr. Colin Rhein, ED , who verbally acknowledged these results.   Electronically Signed   By: Lowella Grip III M.D.   On: 11/29/2014 08:25    Objective  Filed Vitals:   11/29/14 0946 11/29/14 1414 11/29/14 2118 11/30/14 0510  BP: 131/77 110/66 112/66 106/66  Pulse: 120 142 133 125  Temp: 99 F (37.2 C) 98.4 F (36.9 C) 98.1 F (36.7 C) 98.5 F (36.9 C)  TempSrc: Oral Oral Oral Oral  Resp: 22 18 19 18   Height: 5\' 8"  (1.727 m)     Weight: 93.396 kg (205 lb 14.4 oz)     SpO2: 100% 100% 97% 100%    Intake/Output Summary (Last 24 hours) at 11/30/14 1234 Last data filed at 11/30/14 1100  Gross per 24 hour  Intake    480 ml  Output      1 ml  Net    479 ml   Filed Weights   11/29/14 0946  Weight: 93.396 kg (205 lb 14.4 oz)   Exam:  General:  NAD  HEENT: no sclera icterus   Cardiovascular: RRR, tachycardic  Respiratory: CTA biL  Abdomen: soft, non tender  MSK/Extremities: no clubbing/cyanosis   Skin: no rashes  Neuro: non focal   Data Reviewed: Basic Metabolic Panel:  Recent Labs Lab 11/24/14 1000 11/29/14 0230 11/29/14 0231 11/30/14 0439  NA 140  --  132* 137  K 4.0  --  3.3* 3.0*  CL  --   --  96 99  CO2 32*  --  27 27  GLUCOSE 127  --  155* 109*  BUN 14.8  --  22 15  CREATININE 0.8  --  0.79 0.62  CALCIUM 9.4  --  9.2 8.8  MG  --  1.7  --   --   PHOS  --  3.2  --   --    Liver Function Tests:  Recent Labs Lab 11/24/14 1000  AST 21  ALT 23  ALKPHOS 73  BILITOT 0.31  PROT 6.2*  ALBUMIN 3.2*    CBC:  Recent Labs Lab 11/24/14 1000 11/29/14 0231 11/30/14 0439  WBC 4.6 1.4* 0.7*  NEUTROABS 3.8 0.7*  --   HGB 9.6* 9.9* 8.3*  HCT 29.3* 30.4* 25.4*  MCV 86.9 88.4 88.5  PLT 170 124* 102*   Scheduled Meds: . enoxaparin (LOVENOX) injection  1 mg/kg Subcutaneous Q12H  . sodium chloride  3 mL Intravenous Q12H   Continuous Infusions: . sodium chloride 75 mL/hr at 11/30/14 1131    Marzetta Board, MD [facility] Pager [phone]. If 7 PM - 7 AM, please contact night-coverage at [email or web address], password [facility] 11/30/2014, 12:34 PM  LOS: 1 day

## 2014-11-30 NOTE — Progress Notes (Signed)
Dr Cruzita Lederer was notified of pt's swelling to arms and HR. Orders to stop IVF. IVF stopped. No complaints  of SOB or chest pain.

## 2014-12-01 ENCOUNTER — Other Ambulatory Visit: Payer: 59

## 2014-12-01 ENCOUNTER — Ambulatory Visit: Payer: 59 | Admitting: Hematology and Oncology

## 2014-12-01 ENCOUNTER — Ambulatory Visit: Payer: 59

## 2014-12-01 DIAGNOSIS — I2699 Other pulmonary embolism without acute cor pulmonale: Principal | ICD-10-CM

## 2014-12-01 DIAGNOSIS — D61818 Other pancytopenia: Secondary | ICD-10-CM

## 2014-12-01 DIAGNOSIS — R609 Edema, unspecified: Secondary | ICD-10-CM

## 2014-12-01 LAB — BASIC METABOLIC PANEL
ANION GAP: 7 (ref 5–15)
BUN: 13 mg/dL (ref 6–23)
CHLORIDE: 100 mmol/L (ref 96–112)
CO2: 29 mmol/L (ref 19–32)
Calcium: 9 mg/dL (ref 8.4–10.5)
Creatinine, Ser: 0.73 mg/dL (ref 0.50–1.10)
GFR calc Af Amer: >90 mL/min (ref >90)
GFR calc non Af Amer: >90 mL/min (ref >90)
Glucose, Bld: 150 mg/dL — ABNORMAL HIGH (ref 70–99)
POTASSIUM: 3 mmol/L — AB (ref 3.5–5.1)
Sodium: 136 mmol/L (ref 135–145)

## 2014-12-01 LAB — CBC WITH DIFFERENTIAL/PLATELET
Basophils Absolute: 0 10*3/uL (ref 0.0–0.1)
Basophils Relative: 2 % — ABNORMAL HIGH (ref 0–1)
EOS PCT: 1 % (ref 0–5)
Eosinophils Absolute: 0 10*3/uL (ref 0.0–0.7)
HCT: 26.3 % — ABNORMAL LOW (ref 36.0–46.0)
Hemoglobin: 8.6 g/dL — ABNORMAL LOW (ref 12.0–15.0)
Lymphocytes Relative: 47 % — ABNORMAL HIGH (ref 12–46)
Lymphs Abs: 0.4 10*3/uL — ABNORMAL LOW (ref 0.7–4.0)
MCH: 29.1 pg (ref 26.0–34.0)
MCHC: 32.7 g/dL (ref 30.0–36.0)
MCV: 88.9 fL (ref 78.0–100.0)
MONOS PCT: 6 % (ref 3–12)
Monocytes Absolute: 0.1 10*3/uL (ref 0.1–1.0)
Neutro Abs: 0.4 10*3/uL — ABNORMAL LOW (ref 1.7–7.7)
Neutrophils Relative %: 44 % (ref 43–77)
PLATELETS: 119 10*3/uL — AB (ref 150–400)
RBC: 2.96 MIL/uL — AB (ref 3.87–5.11)
RDW: 18.7 % — ABNORMAL HIGH (ref 11.5–15.5)
WBC: 0.9 10*3/uL — AB (ref 4.0–10.5)

## 2014-12-01 LAB — TSH: TSH: 0.795 u[IU]/mL (ref 0.350–4.500)

## 2014-12-01 MED ORDER — SODIUM CHLORIDE 0.9 % IJ SOLN
10.0000 mL | INTRAMUSCULAR | Status: DC | PRN
  Administered 2014-12-01 – 2014-12-03 (×3): 10 mL
  Filled 2014-12-01 (×2): qty 40

## 2014-12-01 MED ORDER — SODIUM CHLORIDE 0.9 % IJ SOLN: 10.0000 mL | Freq: Two times a day (BID) | INTRAMUSCULAR | Status: DC

## 2014-12-01 MED ORDER — POTASSIUM CHLORIDE CRYS ER 20 MEQ PO TBCR
40.0000 meq | EXTENDED_RELEASE_TABLET | ORAL | Status: AC
  Administered 2014-12-01: 40 meq via ORAL
  Filled 2014-12-01: qty 2

## 2014-12-01 MED ORDER — ENOXAPARIN SODIUM 150 MG/ML ~~LOC~~ SOLN
1.5000 mg/kg | SUBCUTANEOUS | Status: DC
  Administered 2014-12-01 – 2014-12-02 (×2): 140 mg via SUBCUTANEOUS
  Filled 2014-12-01 (×2): qty 1

## 2014-12-01 MED ORDER — FUROSEMIDE 10 MG/ML IJ SOLN
20.0000 mg | Freq: Once | INTRAMUSCULAR | Status: AC
  Administered 2014-12-01: 20 mg via INTRAVENOUS
  Filled 2014-12-01: qty 2

## 2014-12-01 MED ORDER — METOPROLOL TARTRATE 25 MG PO TABS
12.5000 mg | ORAL_TABLET | Freq: Two times a day (BID) | ORAL | Status: DC
  Administered 2014-12-01 (×2): 12.5 mg via ORAL
  Filled 2014-12-01 (×2): qty 1

## 2014-12-01 NOTE — Progress Notes (Signed)
PROGRESS NOTE  Jamie Edwards CBS:[id number] DOB: [birth year] DOA: 11/29/2014 PCP: Osborne Casco, MD  HPI: 44 y.o. female with history of breast cancer started a new chemotherapy treatment within the last week. States that afterwards she developed some upper respiratory symptoms of which she was prescribed azithromycin. After taken medication she felt that it was given her increased bouts of diarrhea. Yesterday she reports feeling lightheaded and with generalized weakness. As a result she presented to the hospital for further evaluation recommendations. Patient was also complaining of shortness of breath in that it felt like she could not catch her breath at home. Further evaluation with CT angiogram would reveal new diagnosis of pulmonary embolus  Subjective / 24 H Interval events - complains of swelling in her arms  Assessment/Plan: Principal Problem:   Pulmonary embolism Active Problems:   Breast cancer of upper-outer quadrant of right female breast   Tachycardia   Antineoplastic chemotherapy induced pancytopenia   PE - CT scan positive for pulmonary embolism, she has a history of breast cancer currently undergoing treatment - She was started on Lovenox, continue - CT scan without evidence of right heart strain, 2-D echo as below and fairly unremarkable - will switch to Xarelto on d/c per Oncology   Fluid overload - low dose Lasix, supplement potassium, stop IVF  Breast cancer - she is currently undergoing chemotherapy, this is to be managed as outpatient  Sinus tachycardia - likely due to #1, no chest pain/palpitations and is otherwise asymptomatic  Pancytopenia - due to ongoing chemotherapy, improving today   Diet: Diet Heart Fluids: NS DVT Prophylaxis: Lovenox  Code Status: Full Code Family Communication: d/w patient and husband bedside Disposition Plan: remain inpatient  Consultants:  Oncology - phone, Dr. Lindi Adie  Procedures:  2D echo Study  Conclusions - Left ventricle: The cavity size was normal. Wall thickness was normal. Systolic function was normal. The estimated ejection fraction was in the range of 55% to 65%. Mitral valve: There was mild regurgitation.   Antibiotics  Anti-infectives    None       Studies  No results found.  Objective  Filed Vitals:   11/30/14 2117 12/01/14 0523 12/01/14 1100 12/01/14 1436  BP: 99/61 101/65 109/63 98/61  Pulse: 130 119 134 118  Temp: 98.9 F (37.2 C) 98.8 F (37.1 C)  98 F (36.7 C)  TempSrc: Oral Oral  Oral  Resp: 18 18  18   Height:      Weight:      SpO2: 97% 98%  96%    Intake/Output Summary (Last 24 hours) at 12/01/14 1548 Last data filed at 12/01/14 1300  Gross per 24 hour  Intake    850 ml  Output    600 ml  Net    250 ml   Filed Weights   11/29/14 0946  Weight: 93.396 kg (205 lb 14.4 oz)   Exam:  General:  NAD  HEENT: no sclera icterus   Cardiovascular: RRR, tachycardic  Respiratory: CTA biL  Abdomen: soft, non tender  MSK/Extremities: no clubbing/cyanosis   Skin: no rashes  Neuro: non focal   Data Reviewed: Basic Metabolic Panel:  Recent Labs Lab 11/29/14 0230 11/29/14 0231 11/30/14 0439 12/01/14 0830  NA  --  132* 137 136  K  --  3.3* 3.0* 3.0*  CL  --  96 99 100  CO2  --  27 27 29   GLUCOSE  --  155* 109* 150*  BUN  --  22 15 13  CREATININE  --  0.79 0.62 0.73  CALCIUM  --  9.2 8.8 9.0  MG 1.7  --   --   --   PHOS 3.2  --   --   --    Liver Function Tests: No results for input(s): AST, ALT, ALKPHOS, BILITOT, PROT, ALBUMIN in the last 168 hours. CBC:  Recent Labs Lab 11/29/14 0231 11/30/14 0439 12/01/14 0830  WBC 1.4* 0.7* 0.9*  NEUTROABS 0.7*  --  0.4*  HGB 9.9* 8.3* 8.6*  HCT 30.4* 25.4* 26.3*  MCV 88.4 88.5 88.9  PLT 124* 102* 119*   Scheduled Meds: . enoxaparin (LOVENOX) injection  1.5 mg/kg Subcutaneous Q24H  . furosemide  20 mg Intravenous Once  . metoprolol tartrate  12.5 mg Oral BID  . potassium  chloride  40 mEq Oral Q3H  . sodium chloride  10-40 mL Intracatheter Q12H  . sodium chloride  3 mL Intravenous Q12H   Continuous Infusions:    Marzetta Board, MD [facility] Pager [phone]. If 7 PM - 7 AM, please contact night-coverage at [email or web address], password [facility] 12/01/2014, 3:48 PM  LOS: 2 days

## 2014-12-01 NOTE — Progress Notes (Signed)
SUBJECTIVE: Breathing is better but complains of diffuse swelling of both her arms and face including the neck  OBJECTIVE PHYSICAL EXAMINATION:  Filed Vitals:   12/01/14 1100  BP: 109/63  Pulse: 134  Temp:   Resp:    Filed Weights   11/29/14 0946  Weight: 205 lb 14.4 oz (93.396 kg)    GENERAL:alert, no distress and comfortable SKIN: skin color, texture, turgor are normal, no rashes or significant lesions EYES: normal, Conjunctiva are pink and non-injected, sclera clear OROPHARYNX:no exudate, no erythema and lips, buccal mucosa, and tongue normal  NECK: Swelling LYMPH:  no palpable lymphadenopathy in the cervical, axillary or inguinal LUNGS: clear to auscultation and percussion with normal breathing effort HEART: Tachycardia  ABDOMEN:abdomen soft, non-tender and normal bowel sounds Musculoskeletal:no cyanosis of digits and no clubbing  NEURO: alert & oriented x 3 with fluent speech, no focal motor/sensory deficits  LABORATORY DATA:  I have reviewed the data as listed CMP Latest Ref Rng 12/01/2014 11/30/2014 11/29/2014  Glucose 70 - 99 mg/dL 150(H) 109(H) 155(H)  BUN 6 - 23 mg/dL 13 15 22   Creatinine 0.50 - 1.10 mg/dL 0.73 0.62 0.79  Sodium 135 - 145 mmol/L 136 137 132(L)  Potassium 3.5 - 5.1 mmol/L 3.0(L) 3.0(L) 3.3(L)  Chloride 96 - 112 mmol/L 100 99 96  CO2 19 - 32 mmol/L 29 27 27   Calcium 8.4 - 10.5 mg/dL 9.0 8.8 9.2  Total Protein 6.4 - 8.3 g/dL - - -  Total Bilirubin 0.20 - 1.20 mg/dL - - -  Alkaline Phos 40 - 150 U/L - - -  AST 5 - 34 U/L - - -  ALT 0 - 55 U/L - - -      Lab Results  Component Value Date   WBC 0.9* 12/01/2014   HGB 8.6* 12/01/2014   HCT 26.3* 12/01/2014   MCV 88.9 12/01/2014   PLT 119* 12/01/2014   NEUTROABS 0.4* 12/01/2014    ASSESSMENT AND PLAN: 1. Pulmonary embolism: Currently on Lovenox. I recommend switching to Xarelto at discharge because of difficulty with giving self injections for the next 6 months. 2. Increased swelling of the  face and arms: Recommend giving Lasix to help decrease the swelling 3. Pancytopenia: Related to chemotherapy. We will need to dose reduce her chemotherapy. Her next treatment will be scheduled for next Thursday with a clinic follow-up with me at the same time.  Thank you very much for your excellent care and allowing me to participate in her care.

## 2014-12-01 NOTE — Progress Notes (Signed)
ANTICOAGULATION CONSULT NOTE - Follow up  Pharmacy Consult for Enoxaparin Indication: VTE treatment  Allergies  Allergen Reactions  . Penicillins Other (See Comments)    Stomach upset  . Zithromax [Azithromycin] Diarrhea    Patient Measurements: Height: 5\' 8"  (172.7 cm) Weight: 205 lb 14.4 oz (93.396 kg) IBW/kg (Calculated) : 63.9  Vital Signs: Temp: 98.8 F (37.1 C) (02/25 0523) Temp Source: Oral (02/25 0523) BP: 101/65 mmHg (02/25 0523) Pulse Rate: 119 (02/25 0523)  Labs:  Recent Labs  11/29/14 0231 11/30/14 0439 12/01/14 0830  HGB 9.9* 8.3* 8.6*  HCT 30.4* 25.4* 26.3*  PLT 124* 102* 119*  CREATININE 0.79 0.62 0.73    Estimated Creatinine Clearance: 109.5 mL/min (by C-G formula based on Cr of 0.73).   Medical History: Past Medical History  Diagnosis Date  . Medical history non-contributory   . Anxiety   . Breast cancer 08/2014    rt.breast ca    Assessment: 44 y.o. Female with history of breast cancer started a new chemotherapy treatment on 2/18. States that afterwards she developed some upper respiratory symptoms of which she was prescribed azithromycin. Patient was also complaining of shortness of breath in that it felt like she could not catch her breath at home. Further evaluation with CT angiogram reveals new diagnosis of pulmonary embolus.  Pharmacy consulted to dose enoxaparin  Today: MD switched Lovenox to 1.5mg /kg q24h. CBC has been low but stable.  SCr wnl  Goal of Therapy:  Anti-Xa level 0.6-1 units/ml 4 hours after LMWH dose given Monitor platelets by anticoagulation protocol  Plan:   Agree with Lovenox 140mg  SQ q24h.  CBC q72h  Follow renal function  Romeo Rabon, PharmD, pager [phone]. 12/01/2014,10:30 AM.

## 2014-12-02 ENCOUNTER — Other Ambulatory Visit: Payer: Self-pay | Admitting: *Deleted

## 2014-12-02 ENCOUNTER — Telehealth: Payer: Self-pay | Admitting: Hematology and Oncology

## 2014-12-02 LAB — CBC WITH DIFFERENTIAL/PLATELET
BASOS ABS: 0 10*3/uL (ref 0.0–0.1)
Basophils Relative: 1 % (ref 0–1)
Eosinophils Absolute: 0 10*3/uL (ref 0.0–0.7)
Eosinophils Relative: 0 % (ref 0–5)
HEMATOCRIT: 25.5 % — AB (ref 36.0–46.0)
HEMOGLOBIN: 8.5 g/dL — AB (ref 12.0–15.0)
LYMPHS ABS: 0.4 10*3/uL — AB (ref 0.7–4.0)
Lymphocytes Relative: 50 % — ABNORMAL HIGH (ref 12–46)
MCH: 29.5 pg (ref 26.0–34.0)
MCHC: 33.3 g/dL (ref 30.0–36.0)
MCV: 88.5 fL (ref 78.0–100.0)
MONOS PCT: 15 % — AB (ref 3–12)
Monocytes Absolute: 0.1 10*3/uL (ref 0.1–1.0)
Neutro Abs: 0.3 10*3/uL — ABNORMAL LOW (ref 1.7–7.7)
Neutrophils Relative %: 34 % — ABNORMAL LOW (ref 43–77)
PLATELETS: 114 10*3/uL — AB (ref 150–400)
RBC: 2.88 MIL/uL — ABNORMAL LOW (ref 3.87–5.11)
RDW: 18.7 % — AB (ref 11.5–15.5)
WBC: 0.8 10*3/uL — AB (ref 4.0–10.5)

## 2014-12-02 LAB — PATHOLOGIST SMEAR REVIEW

## 2014-12-02 LAB — BASIC METABOLIC PANEL
ANION GAP: 10 (ref 5–15)
BUN: 16 mg/dL (ref 6–23)
CHLORIDE: 97 mmol/L (ref 96–112)
CO2: 31 mmol/L (ref 19–32)
Calcium: 9.2 mg/dL (ref 8.4–10.5)
Creatinine, Ser: 0.75 mg/dL (ref 0.50–1.10)
GFR calc Af Amer: >90 mL/min (ref >90)
GFR calc non Af Amer: >90 mL/min (ref >90)
GLUCOSE: 124 mg/dL — AB (ref 70–99)
Potassium: 3.2 mmol/L — ABNORMAL LOW (ref 3.5–5.1)
Sodium: 138 mmol/L (ref 135–145)

## 2014-12-02 MED ORDER — RIVAROXABAN 15 MG PO TABS
15.0000 mg | ORAL_TABLET | Freq: Two times a day (BID) | ORAL | Status: DC
Start: 2014-12-03 — End: 2014-12-03
  Administered 2014-12-03: 15 mg via ORAL
  Filled 2014-12-02: qty 1

## 2014-12-02 MED ORDER — POTASSIUM CHLORIDE CRYS ER 20 MEQ PO TBCR
40.0000 meq | EXTENDED_RELEASE_TABLET | Freq: Once | ORAL | Status: AC
  Administered 2014-12-02: 40 meq via ORAL
  Filled 2014-12-02: qty 2

## 2014-12-02 MED ORDER — METOPROLOL TARTRATE 25 MG PO TABS
25.0000 mg | ORAL_TABLET | Freq: Two times a day (BID) | ORAL | Status: DC
  Administered 2014-12-02 – 2014-12-03 (×3): 25 mg via ORAL
  Filled 2014-12-02 (×3): qty 1

## 2014-12-02 NOTE — Progress Notes (Signed)
PROGRESS NOTE  [name] QPR:[id number] DOB: 1971/02/23 DOA: 11/29/2014 PCP: Osborne Casco, MD  HPI: Jamie Edwards is a 44 yo African American female with a history of breast cancer. She started a new chemotherapy treatment last week and developed upper respiratory symptoms afterwards. Patient was prescribed azithromycin and experienced diarrhea afterwards. She presented to the ED on 2/23 with shortness of breath, generalized weakness, and lightheadedness. CT angiogram revealed a pulmonary embolus.   Subjective: - Today she is feeling better, still complains of a chronic intermittent cough and bilateral hand edema.  - States she has an increased appetite compared to yesterday.  - Denies headache, nausea, vomiting, chest pain, palpitations, shortness of breath and lightheadedness.  - Heart rate continues to be elevated in the 130s.   Assessment/Plan:  Pulmonary Embolism  - CT scan positive for pulmonary embolism, currently undergoing chemotherapy for breast cancer - Continue Lovenox  - Will switch to Xarelto per Oncology - 2-D ECHO on 2/23 showed an EF of 55-65% with mild mitral regurgitation. Normal ventricle cavity and wall thickness.   Fluid Overload - Bilateral hands are edematous x 2 days, mild improvement after Lasix, right hand > left, check for DVT on right  Sinus Tachycardia  - Most likely attributed to #1 - Denies chest pain or palpitations - Increased metoprolol to 25mg  BID  Breast Cancer  - Receiving outpatient treatment   Pancytopenia  - See labs below, improved yesterday, but have slightly decreased today  - Mostly likely d/t ongoing chemotherapy, Dr. Lindi Adie will hold chemo for 1 week and decrease dose  DVT Prophylaxis:  A/C  Code Status: Full  Family Communication: Discussed with patient; husband in the room Disposition Plan: Inpatient, possible discharge tomorrow  Consultants:  Oncology - Dr.  Lindi Adie  Procedures:  None  Antibiotics:  None  Objective: Filed Vitals:   12/01/14 2129 12/02/14 0510 12/02/14 0758 12/02/14 0900  BP: 105/60 105/65 107/65   Pulse: 138 120 139 133  Temp: 99.3 F (37.4 C) 98 F (36.7 C) 98.2 F (36.8 C)   TempSrc: Oral Oral Oral   Resp: 18 19 18    Height:      Weight:      SpO2: 100% 100% 100%     Intake/Output Summary (Last 24 hours) at 12/02/14 1023 Last data filed at 12/02/14 0840  Gross per 24 hour  Intake    600 ml  Output   1100 ml  Net   -500 ml   Filed Weights   11/29/14 0946  Weight: 93.396 kg (205 lb 14.4 oz)    Exam: General: Well developed, well nourished, NAD, appears stated age  62:  Anicteic Sclera, MMM.   Neck: Supple, no JVD, no masses  Cardiovascular: RRR, S1 S2 auscultated, no rubs, murmurs or gallops. Bilateral 1+ edema in the hands R>L Respiratory: Clear to auscultation bilaterally with equal chest rise  Abdomen: Soft, nontender, nondistended, + bowel sounds  Extremities: warm dry without cyanosis clubbing. Neuro: AAOx3, cranial nerves grossly intact.  Skin: Without rashes exudates or nodules.   Psych: Normal affect and demeanor with intact judgement and insight  Data Reviewed: Basic Metabolic Panel:  Recent Labs Lab 11/29/14 0230 11/29/14 0231 11/30/14 0439 12/01/14 0830 12/02/14 0525  NA  --  132* 137 136 138  K  --  3.3* 3.0* 3.0* 3.2*  CL  --  96 99 100 97  CO2  --  27 27 29 31   GLUCOSE  --  155* 109* 150* 124*  BUN  --  22 15 13 16   CREATININE  --  0.79 0.62 0.73 0.75  CALCIUM  --  9.2 8.8 9.0 9.2  MG 1.7  --   --   --   --   PHOS 3.2  --   --   --   --    CBC:  Recent Labs Lab 11/29/14 0231 11/30/14 0439 12/01/14 0830 12/02/14 0525  WBC 1.4* 0.7* 0.9* 0.8*  NEUTROABS 0.7*  --  0.4* 0.3*  HGB 9.9* 8.3* 8.6* 8.5*  HCT 30.4* 25.4* 26.3* 25.5*  MCV 88.4 88.5 88.9 88.5  PLT 124* 102* 119* 114*   Scheduled Meds: . enoxaparin (LOVENOX) injection  1.5 mg/kg Subcutaneous Q24H   . metoprolol tartrate  25 mg Oral BID  . sodium chloride  10-40 mL Intracatheter Q12H  . sodium chloride  3 mL Intravenous Q12H   Continuous Infusions:   Principal Problem:   Pulmonary embolism Active Problems:   Breast cancer of upper-outer quadrant of right female breast   Tachycardia   Antineoplastic chemotherapy induced pancytopenia   Foye Spurling, PA-S [facility] 12/02/2014, 10:23 AM    Jahmez Bily M. Cruzita Lederer, MD [facility] [phone]

## 2014-12-02 NOTE — Progress Notes (Signed)
ANTICOAGULATION CONSULT NOTE - Initial Consult  Pharmacy Consult for Xarelto Indication: pulmonary embolus  Allergies  Allergen Reactions  . Penicillins Other (See Comments)    Stomach upset  . Zithromax [Azithromycin] Diarrhea    Patient Measurements: Height: 5\' 8"  (172.7 cm) Weight: 205 lb 14.4 oz (93.396 kg) IBW/kg (Calculated) : 63.9  Vital Signs: Temp: 97.9 F (36.6 C) (02/26 1259) Temp Source: Oral (02/26 1259) BP: 103/57 mmHg (02/26 1259) Pulse Rate: 114 (02/26 1259)  Labs:  Recent Labs  11/30/14 0439 12/01/14 0830 12/02/14 0525  HGB 8.3* 8.6* 8.5*  HCT 25.4* 26.3* 25.5*  PLT 102* 119* 114*  CREATININE 0.62 0.73 0.75    Estimated Creatinine Clearance: 109.5 mL/min (by C-G formula based on Cr of 0.75).   Medical History: Past Medical History  Diagnosis Date  . Medical history non-contributory   . Anxiety   . Breast cancer 08/2014    rt.breast ca    Medications:  Scheduled:  . metoprolol tartrate  25 mg Oral BID  . sodium chloride  10-40 mL Intracatheter Q12H  . sodium chloride  3 mL Intravenous Q12H   Infusions:    Assessment: 44 yo female with breast cancer currently undergoing chemotherapy with carbo/Taxol found to have a new PE on admission started on full dose Lovenox. To transition to Xarelto per oncology recommendations and to discontinue Lovenox 140mg  daily  Goal of Therapy:  monitor SCr, CBC, bleeding Monitor platelets by anticoagulation protocol: Yes   Plan:  1) Note last dose of Lovenox 1.5mg /kg q24 was this AM at 10am 2) Start Xarelto 15mg  BID with meals beginning tomorrow 2/27 (0-2 hrs before start time of when next scheduled Lovenox would have been) x 21 days then Xarelto 20mg  once daily 3) Will counsel patient prior to discharge and provide education [place], PharmD, BCPS Pager [phone] 12/02/2014 4:28 PM

## 2014-12-02 NOTE — Telephone Encounter (Signed)
added appt per Diane ok to over book

## 2014-12-03 DIAGNOSIS — I2699 Other pulmonary embolism without acute cor pulmonale: Secondary | ICD-10-CM

## 2014-12-03 DIAGNOSIS — I82621 Acute embolism and thrombosis of deep veins of right upper extremity: Secondary | ICD-10-CM

## 2014-12-03 DIAGNOSIS — I82629 Acute embolism and thrombosis of deep veins of unspecified upper extremity: Secondary | ICD-10-CM | POA: Diagnosis present

## 2014-12-03 LAB — CBC WITH DIFFERENTIAL/PLATELET
BASOS ABS: 0 10*3/uL (ref 0.0–0.1)
Basophils Relative: 1 % (ref 0–1)
Eosinophils Absolute: 0 10*3/uL (ref 0.0–0.7)
Eosinophils Relative: 0 % (ref 0–5)
HEMATOCRIT: 25.5 % — AB (ref 36.0–46.0)
HEMOGLOBIN: 8.1 g/dL — AB (ref 12.0–15.0)
LYMPHS ABS: 0.4 10*3/uL — AB (ref 0.7–4.0)
Lymphocytes Relative: 56 % — ABNORMAL HIGH (ref 12–46)
MCH: 28.6 pg (ref 26.0–34.0)
MCHC: 31.8 g/dL (ref 30.0–36.0)
MCV: 90.1 fL (ref 78.0–100.0)
MONOS PCT: 20 % — AB (ref 3–12)
Monocytes Absolute: 0.1 10*3/uL (ref 0.1–1.0)
Neutro Abs: 0.2 10*3/uL — ABNORMAL LOW (ref 1.7–7.7)
Neutrophils Relative %: 23 % — ABNORMAL LOW (ref 43–77)
Platelets: 128 10*3/uL — ABNORMAL LOW (ref 150–400)
RBC: 2.83 MIL/uL — ABNORMAL LOW (ref 3.87–5.11)
RDW: 19.1 % — ABNORMAL HIGH (ref 11.5–15.5)
WBC: 0.7 10*3/uL — CL (ref 4.0–10.5)

## 2014-12-03 LAB — POTASSIUM: POTASSIUM: 3.4 mmol/L — AB (ref 3.5–5.1)

## 2014-12-03 MED ORDER — HEPARIN SOD (PORK) LOCK FLUSH 100 UNIT/ML IV SOLN
500.0000 [IU] | INTRAVENOUS | Status: AC | PRN
  Administered 2014-12-03: 500 [IU]

## 2014-12-03 MED ORDER — METOPROLOL TARTRATE 25 MG PO TABS: 25.0000 mg | ORAL_TABLET | Freq: Two times a day (BID) | ORAL | Status: DC

## 2014-12-03 MED ORDER — RIVAROXABAN (XARELTO) VTE STARTER PACK (15 & 20 MG): ORAL_TABLET | ORAL | Status: DC

## 2014-12-03 NOTE — Discharge Summary (Addendum)
Physician Discharge Summary  Jamie Edwards RSW:[id number] DOB: [birth year] DOA: 11/29/2014  PCP: Osborne Casco, MD  Admit date: 11/29/2014 Discharge date: 12/03/2014  Time spent: 45 minutes  Recommendations for Outpatient Follow-up:  1. Follow up with Dr. Lindi Adie as scheduled next week   Discharge Diagnoses:  Principal Problem:   Pulmonary embolism Active Problems:   Breast cancer of upper-outer quadrant of right female breast   Tachycardia   Antineoplastic chemotherapy induced pancytopenia   Arm DVT (deep venous thromboembolism), acute  Discharge Condition: stable  Diet recommendation: regular  Filed Weights   11/29/14 0946  Weight: 93.396 kg (205 lb 14.4 oz)    History of present illness:  Jamie Edwards is a 44 y.o. female with history of breast cancer started a new chemotherapy treatment within the last week. States that afterwards she developed some upper respiratory symptoms of which she was prescribed azithromycin. After taken medication she felt that it was given her increased bouts of diarrhea. Yesterday she reports feeling lightheaded and with generalized weakness. As a result she presented to the hospital for further evaluation recommendations. Patient was also complaining of shortness of breath in that it felt like she could not catch her breath at home. Further evaluation with CT angiogram would reveal new diagnosis of pulmonary embolus  Hospital Course:  Pulmonary Embolism  - CT scan positive for pulmonary embolism, currently undergoing chemotherapy for breast cancer - primary oncologist Dr. Lindi Adie evaluated patient while hospitalized, recommended Xarelto which was prescribed on discharge - 2-D ECHO on 2/23 showed an EF of 55-65% with mild mitral regurgitation. Normal ventricle cavity and wall thickness.   Right arm swelling due to RUE DVT - she has a degree of fluid overload, better after IV Lasix, however her right arm was more swollen, DVT US  with acute non occlusive DVT noted in the right internal jugular vein and acute DVT noted in the right brachial vein. On anticoagulation for #1  Sinus Tachycardia  - Most likely attributed to #1 - Denies chest pain or palpitations, asymptomatic - was started on Metoprolol, heart rate improved  Breast Cancer  - Receiving outpatient treatment   Pancytopenia  - d/t ongoing chemotherapy, Dr. Lindi Adie will hold chemo for 1 week and decrease dose, outpatient follow up - afebrile   Procedures:  2D echo  RUE DVT US   Consultations:  Oncology   Discharge Exam: Filed Vitals:   12/02/14 1259 12/02/14 2233 12/03/14 0540 12/03/14 0950  BP: 103/57 121/68 113/65 115/62  Pulse: 114 116 103 105  Temp: 97.9 F (36.6 C) 98.2 F (36.8 C) 98.1 F (36.7 C)   TempSrc: Oral Oral Oral   Resp: 16  18   Height:      Weight:      SpO2: 99% 95% 97%     General: NAD Cardiovascular: RRR Respiratory: CTA biL  Discharge Instructions     Medication List    TAKE these medications        ALPRAZolam 0.25 MG tablet  Commonly known as:  XANAX  Take 0.25 mg by mouth 2 (two) times daily as needed for anxiety.     azithromycin 250 MG tablet  Commonly known as:  ZITHROMAX Z-PAK  Take 2 tabs (500 mg) PO on day # 1; and then take 1 tab (250 mg) PO QD till gone.     dexamethasone 4 MG tablet  Commonly known as:  DECADRON  TAKE 2 TABS WITH FOOD, ONCE A DAY ON THE DAY AFTER  CHEMO, THEN TAKE 2 TABS TWICE DAILY FOR 2 DAYS,     HYDROcodone-homatropine 5-1.5 MG/5ML syrup  Commonly known as:  HYCODAN  Take 5 mLs by mouth every 6 (six) hours as needed for cough.     lidocaine-prilocaine cream  Commonly known as:  EMLA  Apply to affected area once     LORazepam 0.5 MG tablet  Commonly known as:  ATIVAN  TAKE 1 TABLET BY MOUTH EVERY 6 HOURS AS NEEDED FOR NAUSEA/VOMITING     metoprolol tartrate 25 MG tablet  Commonly known as:  LOPRESSOR  Take 1 tablet (25 mg total) by mouth 2 (two) times daily.      ondansetron 8 MG tablet  Commonly known as:  ZOFRAN  Take 1 tablet (8 mg total) by mouth 2 (two) times daily as needed. Start on the third day after chemotherapy.     oxyCODONE-acetaminophen 5-325 MG per tablet  Commonly known as:  ROXICET  Take 1-2 tablets by mouth every 4 (four) hours as needed.     PRESCRIPTION MEDICATION  every 7 (seven) days. Chemotherapy at [facility]) every 7 days.     prochlorperazine 10 MG tablet  Commonly known as:  COMPAZINE  TAKE 1 TABLET (10 MG TOTAL) BY MOUTH EVERY 6 (SIX) HOURS AS NEEDED (NAUSEA OR VOMITING).     Rivaroxaban 15 & 20 MG Tbpk  Commonly known as:  XARELTO STARTER PACK  Take as directed on package: Start with one 15mg  tablet by mouth twice a day with food. On Day 22, switch to one 20mg  tablet once a day with food.       Follow-up Information    Follow up with Osborne Casco, MD. Schedule an appointment as soon as possible for a visit in 4 weeks.   Specialty:  Family Medicine   Contact information:   [address] [place] [address] [phone]       Follow up with Rulon Eisenmenger, MD In 1 week.   Specialty:  Hematology and Oncology   Contact information:   [place] [address] [phone]       The results of significant diagnostics from this hospitalization (including imaging, microbiology, ancillary and laboratory) are listed below for reference.    Significant Diagnostic Studies: Ct Angio Chest Pe W/cm &/or Wo Cm  11/29/2014   CLINICAL DATA:  Breast carcinoma. Tachycardia and difficulty breathing  EXAM: CT ANGIOGRAPHY CHEST WITH CONTRAST  TECHNIQUE: Multidetector CT imaging of the chest was performed using the standard protocol during bolus administration of intravenous contrast. Multiplanar CT image reconstructions and MIPs were obtained to evaluate the vascular anatomy.  CONTRAST:  129mL OMNIPAQUE IOHEXOL 350 MG/ML SOLN  COMPARISON:  Chest radiograph August 31, 2014 and chest CT September 02, 2014  FINDINGS: There is pulmonary embolus in the superior segment right lower lobe pulmonary arterial branch. No more central pulmonary emboli are identified. There is no demonstrable right heart strain.  There is no thoracic aortic aneurysm or dissection.  There is dependent atelectasis in both lower lobes posteriorly. There is no edema or consolidation. On axial slice 47 series 11, there is a 5 mm nodular opacity in the lateral segment of the right middle lobe. No other parenchymal lung nodular opacities are identified.  Port-A-Cath tip is in the superior vena cava. There is no appreciable thoracic adenopathy. The pericardium is not thickened. Thyroid appears unremarkable.  In the visualized upper abdomen, no focal lesion is appreciable.  There are no blastic or lytic bone lesions.  Review of the MIP images confirms the above findings.  IMPRESSION: Pulmonary embolus in the superior segment right lower lobe pulmonary artery. No right heart strain.  No lung edema or consolidation. There is a 5 mm nodular opacity in the lateral segment right middle lobe which must be concerning for small metastatic focus given known breast carcinoma. This nodular opacity was not present on recent prior study.  No appreciable adenopathy.  Critical Value/emergent results were called by telephone at the time of interpretation on 11/29/2014 at 8:24 am to Dr. Colin Rhein, ED , who verbally acknowledged these results.   Electronically Signed   By: Lowella Grip III M.D.   On: 11/29/2014 08:25   Labs: Basic Metabolic Panel:  Recent Labs Lab 11/29/14 0230 11/29/14 0231 11/30/14 0439 12/01/14 0830 12/02/14 0525 12/03/14 0510  NA  --  132* 137 136 138  --   K  --  3.3* 3.0* 3.0* 3.2* 3.4*  CL  --  96 99 100 97  --   CO2  --  27 27 29 31   --   GLUCOSE  --  155* 109* 150* 124*  --   BUN  --  22 15 13 16   --   CREATININE  --  0.79 0.62 0.73 0.75  --   CALCIUM  --  9.2 8.8 9.0 9.2  --   MG 1.7  --    --   --   --   --   PHOS 3.2  --   --   --   --   --    CBC:  Recent Labs Lab 11/29/14 0231 11/30/14 0439 12/01/14 0830 12/02/14 0525 12/03/14 0510  WBC 1.4* 0.7* 0.9* 0.8* 0.7*  NEUTROABS 0.7*  --  0.4* 0.3* 0.2*  HGB 9.9* 8.3* 8.6* 8.5* 8.1*  HCT 30.4* 25.4* 26.3* 25.5* 25.5*  MCV 88.4 88.5 88.9 88.5 90.1  PLT 124* 102* 119* 114* 128*     Signed:  Jayln Branscom  [facility] 12/03/2014, 2:38 PM

## 2014-12-03 NOTE — Progress Notes (Signed)
VASCULAR LAB PRELIMINARY  PRELIMINARY  PRELIMINARY  PRELIMINARY  Right upper extremity venous Doppler completed.    Preliminary report:  There is acute non occlusive DVT noted in the right internal jugular vein and acute DVT noted in the right brachial vein.  There is superficial thrombosis noted in the right basilic vein.  Shooter Tangen, RVT 12/03/2014, 9:50 AM

## 2014-12-03 NOTE — Discharge Instructions (Addendum)
Please take Xarelto 15 mg twice daily for 3 weeks then take 20 mg daily thereafter. Please discuss with your Oncologist how long you need to be on this medication.   Follow with Osborne Casco, MD in 5-7 days  Please get a complete blood count and chemistry panel checked by your Primary MD at your next visit, and again as instructed by your Primary MD. Please get your medications reviewed and adjusted by your Primary MD.  Please request your Primary MD to go over all Hospital Tests and Procedure/Radiological results at the follow up, please get all Hospital records sent to your Prim MD by signing hospital release before you go home.  If you had Pneumonia of Lung problems at the Hospital: Please get a 2 view Chest X ray done in 6-8 weeks after hospital discharge or sooner if instructed by your Primary MD.  If you have Congestive Heart Failure: Please call your Cardiologist or Primary MD anytime you have any of the following symptoms:  1) 3 pound weight gain in 24 hours or 5 pounds in 1 week  2) shortness of breath, with or without a dry hacking cough  3) swelling in the hands, feet or stomach  4) if you have to sleep on extra pillows at night in order to breathe  Follow cardiac low salt diet and 1.5 lit/day fluid restriction.  If you have diabetes Accuchecks 4 times/day, Once in AM empty stomach and then before each meal. Log in all results and show them to your primary doctor at your next visit. If any glucose reading is under 80 or above 300 call your primary MD immediately.  If you have Seizure/Convulsions/Epilepsy: Please do not drive, operate heavy machinery, participate in activities at heights or participate in high speed sports until you have seen by Primary MD or a Neurologist and advised to do so again.  If you had Gastrointestinal Bleeding: Please ask your Primary MD to check a complete blood count within one week of discharge or at your next visit. Your  endoscopic/colonoscopic biopsies that are pending at the time of discharge, will also need to followed by your Primary MD.  Get Medicines reviewed and adjusted. Please take all your medications with you for your next visit with your Primary MD  Please request your Primary MD to go over all hospital tests and procedure/radiological results at the follow up, please ask your Primary MD to get all Hospital records sent to his/her office.  If you experience worsening of your admission symptoms, develop shortness of breath, life threatening emergency, suicidal or homicidal thoughts you must seek medical attention immediately by calling 911 or calling your MD immediately  if symptoms less severe.  You must read complete instructions/literature along with all the possible adverse reactions/side effects for all the Medicines you take and that have been prescribed to you. Take any new Medicines after you have completely understood and accpet all the possible adverse reactions/side effects.   Do not drive or operate heavy machinery when taking Pain medications.   Do not take more than prescribed Pain, Sleep and Anxiety Medications  Special Instructions: If you have smoked or chewed Tobacco  in the last 2 yrs please stop smoking, stop any regular Alcohol  and or any Recreational drug use.  Wear Seat belts while driving.  Please note You were cared for by a hospitalist during your hospital stay. If you have any questions about your discharge medications or the care you received while you were in  the hospital after you are discharged, you can call the unit and asked to speak with the hospitalist on call if the hospitalist that took care of you is not available. Once you are discharged, your primary care physician will handle any further medical issues. Please note that NO REFILLS for any discharge medications will be authorized once you are discharged, as it is imperative that you return to your primary care  physician (or establish a relationship with a primary care physician if you do not have one) for your aftercare needs so that they can reassess your need for medications and monitor your lab values.  You can reach the hospitalist office at phone [phone] or fax [phone]   If you do not have a primary care physician, you can call [phone] for a physician referral.  Activity: As tolerated with Full fall precautions use walker/cane & assistance as needed  Diet: regular  Disposition Home  Information on my medicine - XARELTO (rivaroxaban)  This medication education was reviewed with me or my healthcare representative as part of my discharge preparation.  The pharmacist that spoke with me during my hospital stay was:  Mosetta Pigeon, [place]? Xarelto was prescribed to treat blood clots that may have been found in the veins of your legs (deep vein thrombosis) or in your lungs (pulmonary embolism) and to reduce the risk of them occurring again.  What do you need to know about Xarelto? The starting dose is one 15 mg tablet taken TWICE daily with food for the FIRST 21 DAYS then on (enter date)  12/24/14  the dose is changed to one 20 mg tablet taken ONCE A DAY with your evening meal.  DO NOT stop taking Xarelto without talking to the health care provider who prescribed the medication.  Refill your prescription for 20 mg tablets before you run out.  After discharge, you should have regular check-up appointments with your healthcare provider that is prescribing your Xarelto.  In the future your dose may need to be changed if your kidney function changes by a significant amount.  What do you do if you miss a dose? If you are taking Xarelto TWICE DAILY and you miss a dose, take it as soon as you remember. You may take two 15 mg tablets (total 30 mg) at the same time then resume your regularly scheduled 15 mg twice daily the next day.  If you are taking  Xarelto ONCE DAILY and you miss a dose, take it as soon as you remember on the same day then continue your regularly scheduled once daily regimen the next day. Do not take two doses of Xarelto at the same time.   Important Safety Information Xarelto is a blood thinner medicine that can cause bleeding. You should call your healthcare provider right away if you experience any of the following: ? Bleeding from an injury or your nose that does not stop. ? Unusual colored urine (red or dark brown) or unusual colored stools (red or black). ? Unusual bruising for unknown reasons. ? A serious fall or if you hit your head (even if there is no bleeding).  Some medicines may interact with Xarelto and might increase your risk of bleeding while on Xarelto. To help avoid this, consult your healthcare provider or pharmacist prior to using any new prescription or non-prescription medications, including herbals, vitamins, non-steroidal anti-inflammatory drugs (NSAIDs) and supplements.  This website has more information on Xarelto: [email or web address]

## 2014-12-08 ENCOUNTER — Other Ambulatory Visit (HOSPITAL_BASED_OUTPATIENT_CLINIC_OR_DEPARTMENT_OTHER): Payer: 59

## 2014-12-08 ENCOUNTER — Ambulatory Visit: Payer: 59

## 2014-12-08 ENCOUNTER — Ambulatory Visit (HOSPITAL_BASED_OUTPATIENT_CLINIC_OR_DEPARTMENT_OTHER): Payer: 59 | Admitting: Hematology and Oncology

## 2014-12-08 ENCOUNTER — Telehealth: Payer: Self-pay | Admitting: Hematology and Oncology

## 2014-12-08 VITALS — BP 108/67 | HR 95 | Temp 98.8°F | Resp 18 | Ht 68.0 in | Wt 208.7 lb

## 2014-12-08 DIAGNOSIS — D6481 Anemia due to antineoplastic chemotherapy: Secondary | ICD-10-CM

## 2014-12-08 DIAGNOSIS — C50411 Malignant neoplasm of upper-outer quadrant of right female breast: Secondary | ICD-10-CM

## 2014-12-08 DIAGNOSIS — Z171 Estrogen receptor negative status [ER-]: Secondary | ICD-10-CM

## 2014-12-08 DIAGNOSIS — C773 Secondary and unspecified malignant neoplasm of axilla and upper limb lymph nodes: Secondary | ICD-10-CM

## 2014-12-08 DIAGNOSIS — I2699 Other pulmonary embolism without acute cor pulmonale: Secondary | ICD-10-CM

## 2014-12-08 DIAGNOSIS — R2231 Localized swelling, mass and lump, right upper limb: Secondary | ICD-10-CM

## 2014-12-08 LAB — CBC WITH DIFFERENTIAL/PLATELET
BASO%: 0.7 % (ref 0.0–2.0)
Basophils Absolute: 0 10*3/uL (ref 0.0–0.1)
EOS%: 0 % (ref 0.0–7.0)
Eosinophils Absolute: 0 10*3/uL (ref 0.0–0.5)
HCT: 28.8 % — ABNORMAL LOW (ref 34.8–46.6)
HGB: 9 g/dL — ABNORMAL LOW (ref 11.6–15.9)
LYMPH#: 0.7 10*3/uL — AB (ref 0.9–3.3)
LYMPH%: 44.5 % (ref 14.0–49.7)
MCH: 28.5 pg (ref 25.1–34.0)
MCHC: 31.3 g/dL — ABNORMAL LOW (ref 31.5–36.0)
MCV: 91.1 fL (ref 79.5–101.0)
MONO#: 0.2 10*3/uL (ref 0.1–0.9)
MONO%: 15.1 % — ABNORMAL HIGH (ref 0.0–14.0)
NEUT%: 39.7 % (ref 38.4–76.8)
NEUTROS ABS: 0.6 10*3/uL — AB (ref 1.5–6.5)
Platelets: 200 10*3/uL (ref 145–400)
RBC: 3.16 10*6/uL — AB (ref 3.70–5.45)
RDW: 19.8 % — ABNORMAL HIGH (ref 11.2–14.5)
WBC: 1.5 10*3/uL — AB (ref 3.9–10.3)

## 2014-12-08 LAB — COMPREHENSIVE METABOLIC PANEL (CC13)
ALBUMIN: 2.9 g/dL — AB (ref 3.5–5.0)
ALT: 43 U/L (ref 0–55)
AST: 38 U/L — ABNORMAL HIGH (ref 5–34)
Alkaline Phosphatase: 101 U/L (ref 40–150)
Anion Gap: 10 mEq/L (ref 3–11)
BUN: 7.6 mg/dL (ref 7.0–26.0)
CALCIUM: 9.8 mg/dL (ref 8.4–10.4)
CHLORIDE: 101 meq/L (ref 98–109)
CO2: 30 mEq/L — ABNORMAL HIGH (ref 22–29)
Creatinine: 0.7 mg/dL (ref 0.6–1.1)
GLUCOSE: 110 mg/dL (ref 70–140)
Potassium: 4 mEq/L (ref 3.5–5.1)
Sodium: 141 mEq/L (ref 136–145)
Total Bilirubin: 0.29 mg/dL (ref 0.20–1.20)
Total Protein: 6.4 g/dL (ref 6.4–8.3)

## 2014-12-08 NOTE — Assessment & Plan Note (Addendum)
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 Alopecia 2. Chemotherapy-induced anemia grade 1 3. Sinus pressure: On over-the-counter cold and sinus medications. Resolved 4. Nausea with Taxol carbo chemotherapy: I changed antiemetic regimen from Zofran to Aloxi IV 5. 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 Discontinue Taxol and carboplatin and switched to Abraxane.  Monitoring closely for toxicities  Patient's blood counts today reveal an absolute neutrophil count was 600. They are not adequate for treatment. They are getting better since last week when she was in the hospital. For these reasons we have elected to discontinue Taxol and carboplatin and switch her to single agent Abraxane. Patient also had developed neuropathy this is a reason for change from Taxol to Abraxane  Return to clinic in  1 week for first doses of Abraxane.

## 2014-12-08 NOTE — Telephone Encounter (Signed)
appts made and added md appt to 3/17 per terri and pt will get a new sch on 3/3

## 2014-12-08 NOTE — Progress Notes (Signed)
Patient Care Team: Elaine Griffin, MD as PCP - General (Family Medicine) Vinay K Gudena, MD as Consulting Physician (Hematology and Oncology) Benjamin Hoxworth, MD as Consulting Physician (General Surgery) Sarah Squire, MD as Attending Physician (Radiation Oncology) Gretchen Wallace Dawson, NP as Nurse Practitioner (Nurse Practitioner)  DIAGNOSIS: Breast cancer of upper-outer quadrant of right female breast   Staging form: Breast, AJCC 7th Edition     Clinical: Stage IIIA (T3, N1, M0) - Unsigned       Staging comments: Staged at breast conference 11.11.15    SUMMARY OF ONCOLOGIC HISTORY:   Breast cancer of upper-outer quadrant of right female breast   08/05/2014 Initial Diagnosis Invasive ductal carcinoma, 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, grade 3 ER 0%, PR 0%, HER-2 negative, Ki-67 90%: Right axillary lymph node positive for breast cancer   08/12/2014 Breast MRI 4.5x 7.5 x4 cm biopsy-proven right breast upper outer quadrant cancer with biopsy-proven level I right axillary lymph node with 2 level II right axillary lymph nodes identified   09/08/2014 -  Neo-Adjuvant Chemotherapy Dose dense Adriamycin and Cytoxan x4 followed by weekly Taxol and carboplatin x12    CHIEF COMPLIANT: Recent hospitalization for pulmonary embolism. Profound cytopenias  INTERVAL HISTORY: Jamie Edwards is a 44-year-old lady with above-mentioned history of right-sided breast cancer currently on neoadjuvant chemotherapy. She was on Taxol and carboplatin 4 treatments and then hospitalized for pulmonary embolism. She had profound cytopenia which was in the hospital. She did discharge from the hospital on Xarelto and is here today to be initiated chemotherapy with Taxol and carboplatin. We were going to started at a lower dosage but today her absolute neutrophil count is only at 600. She is still recovering from recent hospitalization and still feels fatigued. Continues to have right  arm swelling.  REVIEW OF SYSTEMS:   Constitutional: Denies fevers, chills or abnormal weight loss Eyes: Denies blurriness of vision Ears, nose, mouth, throat, and face: Denies mucositis or sore throat Respiratory: Denies cough, dyspnea or wheezes Cardiovascular: Denies palpitation, chest discomfort or lower extremity swelling Gastrointestinal:  Denies nausea, heartburn or change in bowel habits Skin: Denies abnormal skin rashes Lymphatics: Denies new lymphadenopathy or easy bruising Neurological:Denies numbness, tingling or new weaknesses Behavioral/Psych: Mood is stable, no new changes  All other systems were reviewed with the patient and are negative.  I have reviewed the past medical history, past surgical history, social history and family history with the patient and they are unchanged from previous note.  ALLERGIES:  is allergic to penicillins and zithromax.  MEDICATIONS:  Current Outpatient Prescriptions  Medication Sig Dispense Refill  . ALPRAZolam (XANAX) 0.25 MG tablet Take 0.25 mg by mouth 2 (two) times daily as needed for anxiety.    . dexamethasone (DECADRON) 4 MG tablet TAKE 2 TABS WITH FOOD, ONCE A DAY ON THE DAY AFTER CHEMO, THEN TAKE 2 TABS TWICE DAILY FOR 2 DAYS, 30 tablet 2  . HYDROcodone-homatropine (HYCODAN) 5-1.5 MG/5ML syrup Take 5 mLs by mouth every 6 (six) hours as needed for cough. 60 mL 0  . lidocaine-prilocaine (EMLA) cream Apply to affected area once 30 g 3  . LORazepam (ATIVAN) 0.5 MG tablet TAKE 1 TABLET BY MOUTH EVERY 6 HOURS AS NEEDED FOR NAUSEA/VOMITING 30 tablet 0  . metoprolol tartrate (LOPRESSOR) 25 MG tablet Take 1 tablet (25 mg total) by mouth 2 (two) times daily. 60 tablet 0  . ondansetron (ZOFRAN) 8 MG tablet Take 1 tablet (8 mg total) by mouth 2 (two)   times daily as needed. Start on the third day after chemotherapy. 30 tablet 1  . oxyCODONE-acetaminophen (ROXICET) 5-325 MG per tablet Take 1-2 tablets by mouth every 4 (four) hours as needed.  (Patient taking differently: Take 1-2 tablets by mouth every 4 (four) hours as needed for moderate pain. ) 30 tablet 0  . PRESCRIPTION MEDICATION every 7 (seven) days. Chemotherapy at [facility] every 7 days.    . prochlorperazine (COMPAZINE) 10 MG tablet TAKE 1 TABLET (10 MG TOTAL) BY MOUTH EVERY 6 (SIX) HOURS AS NEEDED (NAUSEA OR VOMITING). 30 tablet 2  . Rivaroxaban (XARELTO STARTER PACK) 15 & 20 MG TBPK Take as directed on package: Start with one 15mg tablet by mouth twice a day with food. On Day 22, switch to one 20mg tablet once a day with food. 51 each 0   No current facility-administered medications for this visit.    PHYSICAL EXAMINATION: ECOG PERFORMANCE STATUS: 1 - Symptomatic but completely ambulatory  Filed Vitals:   12/08/14 1007  BP: 108/67  Pulse: 95  Temp: 98.8 F (37.1 C)  Resp: 18   Filed Weights   12/08/14 1007  Weight: 208 lb 11.2 oz (94.666 kg)    GENERAL:alert, no distress and comfortable SKIN: skin color, texture, turgor are normal, no rashes or significant lesions EYES: normal, Conjunctiva are pink and non-injected, sclera clear OROPHARYNX:no exudate, no erythema and lips, buccal mucosa, and tongue normal  NECK: supple, thyroid normal size, non-tender, without nodularity LYMPH:  no palpable lymphadenopathy in the cervical, axillary or inguinal LUNGS: clear to auscultation and percussion with normal breathing effort HEART: regular rate & rhythm and no murmurs and no lower extremity edema ABDOMEN:abdomen soft, non-tender and normal bowel sounds Musculoskeletal:no cyanosis of digits and no clubbing  NEURO: alert & oriented x 3 with fluent speech, no focal motor/sensory deficits  LABORATORY DATA:  I have reviewed the data as listed   Chemistry      Component Value Date/Time   NA 141 12/08/2014 0949   NA 138 12/02/2014 0525   K 4.0 12/08/2014 0949   K 3.4* 12/03/2014 0510   CL 97 12/02/2014 0525   CO2 30* 12/08/2014 0949   CO2 31 12/02/2014  0525   BUN 7.6 12/08/2014 0949   BUN 16 12/02/2014 0525   CREATININE 0.7 12/08/2014 0949   CREATININE 0.75 12/02/2014 0525      Component Value Date/Time   CALCIUM 9.8 12/08/2014 0949   CALCIUM 9.2 12/02/2014 0525   ALKPHOS 101 12/08/2014 0949   AST 38* 12/08/2014 0949   ALT 43 12/08/2014 0949   BILITOT 0.29 12/08/2014 0949       Lab Results  Component Value Date   WBC 1.5* 12/08/2014   HGB 9.0* 12/08/2014   HCT 28.8* 12/08/2014   MCV 91.1 12/08/2014   PLT 200 12/08/2014   NEUTROABS 0.6* 12/08/2014   ASSESSMENT & PLAN:  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 Alopecia 2.   Chemotherapy-induced anemia grade 1 3. Sinus pressure: On over-the-counter cold and sinus medications. Resolved 4. Nausea with Taxol carbo chemotherapy: I changed antiemetic regimen from Zofran to Aloxi IV 5. 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 Discontinue Taxol and carboplatin and switched to Abraxane.  Pulmonary embolism: Currently on Xarelto loading dose. I will renew her Xarelto prescription for the maintenance dosage when she comes back for chemotherapy treatments.  Right arm swelling: Unclear etiology. It is getting better so we will watch and monitor this. If it does not get better we might have to evaluate this  further.  Monitoring closely for toxicities  Patient's blood counts today reveal an absolute neutrophil count was 600. They are not adequate for treatment. They are getting better since last week when she was in the hospital. For these reasons we have elected to discontinue Taxol and carboplatin and switch her to single agent Abraxane. Patient also had developed neuropathy this is a reason for change from Taxol to Abraxane  Return to clinic in  1 week for first doses of Abraxane.    No orders of the defined types were placed in this encounter.   The patient has a good understanding of the overall plan. she agrees with it. She will call with any problems that may develop before her next visit here.   Rulon Eisenmenger, MD

## 2014-12-14 ENCOUNTER — Other Ambulatory Visit: Payer: Self-pay

## 2014-12-14 DIAGNOSIS — C50411 Malignant neoplasm of upper-outer quadrant of right female breast: Secondary | ICD-10-CM

## 2014-12-15 ENCOUNTER — Other Ambulatory Visit: Payer: Self-pay | Admitting: *Deleted

## 2014-12-15 ENCOUNTER — Other Ambulatory Visit (HOSPITAL_BASED_OUTPATIENT_CLINIC_OR_DEPARTMENT_OTHER): Payer: 59

## 2014-12-15 ENCOUNTER — Ambulatory Visit (HOSPITAL_BASED_OUTPATIENT_CLINIC_OR_DEPARTMENT_OTHER): Payer: 59

## 2014-12-15 VITALS — BP 110/76 | HR 99 | Temp 98.7°F

## 2014-12-15 DIAGNOSIS — C773 Secondary and unspecified malignant neoplasm of axilla and upper limb lymph nodes: Secondary | ICD-10-CM

## 2014-12-15 DIAGNOSIS — C50411 Malignant neoplasm of upper-outer quadrant of right female breast: Secondary | ICD-10-CM

## 2014-12-15 DIAGNOSIS — Z5111 Encounter for antineoplastic chemotherapy: Secondary | ICD-10-CM

## 2014-12-15 LAB — COMPREHENSIVE METABOLIC PANEL (CC13)
ALBUMIN: 3.2 g/dL — AB (ref 3.5–5.0)
ALT: 31 U/L (ref 0–55)
AST: 35 U/L — ABNORMAL HIGH (ref 5–34)
Alkaline Phosphatase: 98 U/L (ref 40–150)
Anion Gap: 10 mEq/L (ref 3–11)
BUN: 8.8 mg/dL (ref 7.0–26.0)
CO2: 30 mEq/L — ABNORMAL HIGH (ref 22–29)
Calcium: 10.1 mg/dL (ref 8.4–10.4)
Chloride: 101 mEq/L (ref 98–109)
Creatinine: 0.9 mg/dL (ref 0.6–1.1)
EGFR: >90 mL/min/{1.73_m2} (ref >90)
Glucose: 162 mg/dl — ABNORMAL HIGH (ref 70–140)
Potassium: 4.2 mEq/L (ref 3.5–5.1)
Sodium: 141 mEq/L (ref 136–145)
Total Bilirubin: 0.2 mg/dL (ref 0.20–1.20)
Total Protein: 7.2 g/dL (ref 6.4–8.3)

## 2014-12-15 LAB — CBC WITH DIFFERENTIAL/PLATELET
BASO%: 0.5 % (ref 0.0–2.0)
BASOS ABS: 0 10*3/uL (ref 0.0–0.1)
EOS%: 0.2 % (ref 0.0–7.0)
Eosinophils Absolute: 0 10*3/uL (ref 0.0–0.5)
HEMATOCRIT: 34.5 % — AB (ref 34.8–46.6)
HGB: 10.7 g/dL — ABNORMAL LOW (ref 11.6–15.9)
LYMPH%: 23.3 % (ref 14.0–49.7)
MCH: 28.5 pg (ref 25.1–34.0)
MCHC: 31 g/dL — AB (ref 31.5–36.0)
MCV: 91.8 fL (ref 79.5–101.0)
MONO#: 0.4 10*3/uL (ref 0.1–0.9)
MONO%: 9.2 % (ref 0.0–14.0)
NEUT#: 2.9 10*3/uL (ref 1.5–6.5)
NEUT%: 66.8 % (ref 38.4–76.8)
PLATELETS: 294 10*3/uL (ref 145–400)
RBC: 3.76 10*6/uL (ref 3.70–5.45)
RDW: 18.5 % — AB (ref 11.2–14.5)
WBC: 4.3 10*3/uL (ref 3.9–10.3)
lymph#: 1 10*3/uL (ref 0.9–3.3)

## 2014-12-15 MED ORDER — LORAZEPAM 0.5 MG PO TABS: 0.5000 mg | ORAL_TABLET | Freq: Four times a day (QID) | ORAL | Status: DC | PRN

## 2014-12-15 MED ORDER — SODIUM CHLORIDE 0.9 % IJ SOLN
10.0000 mL | INTRAMUSCULAR | Status: DC | PRN
  Administered 2014-12-15: 10 mL
  Filled 2014-12-15: qty 10

## 2014-12-15 MED ORDER — PACLITAXEL PROTEIN-BOUND CHEMO INJECTION 100 MG
80.0000 mg/m2 | Freq: Once | INTRAVENOUS | Status: AC
  Administered 2014-12-15: 175 mg via INTRAVENOUS
  Filled 2014-12-15: qty 35

## 2014-12-15 MED ORDER — HEPARIN SOD (PORK) LOCK FLUSH 100 UNIT/ML IV SOLN
500.0000 [IU] | Freq: Once | INTRAVENOUS | Status: AC | PRN
  Administered 2014-12-15: 500 [IU]
  Filled 2014-12-15: qty 5

## 2014-12-15 MED ORDER — SODIUM CHLORIDE 0.9 % IV SOLN
Freq: Once | INTRAVENOUS | Status: AC
  Administered 2014-12-15: 11:00:00 via INTRAVENOUS

## 2014-12-15 MED ORDER — PROCHLORPERAZINE MALEATE 10 MG PO TABS: 10.0000 mg | ORAL_TABLET | Freq: Four times a day (QID) | ORAL | Status: DC | PRN

## 2014-12-15 MED ORDER — SODIUM CHLORIDE 0.9 % IV SOLN
Freq: Once | INTRAVENOUS | Status: AC
  Administered 2014-12-15: 12:00:00 via INTRAVENOUS
  Filled 2014-12-15: qty 4

## 2014-12-15 MED ORDER — ONDANSETRON HCL 8 MG PO TABS: 8.0000 mg | ORAL_TABLET | Freq: Two times a day (BID) | ORAL | Status: DC

## 2014-12-15 NOTE — Patient Instructions (Signed)
[facility] Discharge Instructions for Patients Receiving Chemotherapy  Today you received the following chemotherapy agents Abraxane  To help prevent nausea and vomiting after your treatment, we encourage you to take your nausea medication as prescribed.   If you develop nausea and vomiting that is not controlled by your nausea medication, call the clinic.   BELOW ARE SYMPTOMS THAT SHOULD BE REPORTED IMMEDIATELY:  *FEVER GREATER THAN 100.5 F  *CHILLS WITH OR WITHOUT FEVER  NAUSEA AND VOMITING THAT IS NOT CONTROLLED WITH YOUR NAUSEA MEDICATION  *UNUSUAL SHORTNESS OF BREATH  *UNUSUAL BRUISING OR BLEEDING  TENDERNESS IN MOUTH AND THROAT WITH OR WITHOUT PRESENCE OF ULCERS  *URINARY PROBLEMS  *BOWEL PROBLEMS  UNUSUAL RASH Items with * indicate a potential emergency and should be followed up as soon as possible.  Feel free to call the clinic you have any questions or concerns. The clinic phone number is [phone].    

## 2014-12-16 ENCOUNTER — Telehealth: Payer: Self-pay | Admitting: *Deleted

## 2014-12-16 NOTE — Telephone Encounter (Signed)
-----   Message from Hebert Soho, RN sent at 12/15/2014  2:12 PM EST ----- Regarding: 1st time chemo First Abraxane. No reaction. Dr. Lindi Adie. Call late morning.

## 2014-12-16 NOTE — Telephone Encounter (Signed)
Left message for follow up on treatment yesterday. To call us if has any questions or concerns.

## 2014-12-19 ENCOUNTER — Telehealth: Payer: Self-pay | Admitting: *Deleted

## 2014-12-19 NOTE — Telephone Encounter (Signed)
Received telephone advice record from [facility]. Sent to scan.

## 2014-12-22 ENCOUNTER — Other Ambulatory Visit: Payer: 59

## 2014-12-22 ENCOUNTER — Ambulatory Visit (HOSPITAL_BASED_OUTPATIENT_CLINIC_OR_DEPARTMENT_OTHER): Payer: 59 | Admitting: Hematology and Oncology

## 2014-12-22 ENCOUNTER — Other Ambulatory Visit: Payer: Self-pay | Admitting: *Deleted

## 2014-12-22 ENCOUNTER — Ambulatory Visit (HOSPITAL_BASED_OUTPATIENT_CLINIC_OR_DEPARTMENT_OTHER): Payer: 59

## 2014-12-22 ENCOUNTER — Ambulatory Visit: Payer: 59

## 2014-12-22 ENCOUNTER — Telehealth: Payer: Self-pay | Admitting: Hematology and Oncology

## 2014-12-22 ENCOUNTER — Other Ambulatory Visit (HOSPITAL_BASED_OUTPATIENT_CLINIC_OR_DEPARTMENT_OTHER): Payer: 59

## 2014-12-22 VITALS — BP 102/57 | HR 88 | Temp 98.1°F | Resp 18 | Ht 68.0 in | Wt 203.0 lb

## 2014-12-22 DIAGNOSIS — C50411 Malignant neoplasm of upper-outer quadrant of right female breast: Secondary | ICD-10-CM

## 2014-12-22 DIAGNOSIS — I2699 Other pulmonary embolism without acute cor pulmonale: Secondary | ICD-10-CM

## 2014-12-22 DIAGNOSIS — C773 Secondary and unspecified malignant neoplasm of axilla and upper limb lymph nodes: Secondary | ICD-10-CM

## 2014-12-22 DIAGNOSIS — Z171 Estrogen receptor negative status [ER-]: Secondary | ICD-10-CM

## 2014-12-22 DIAGNOSIS — D6481 Anemia due to antineoplastic chemotherapy: Secondary | ICD-10-CM

## 2014-12-22 DIAGNOSIS — Z5111 Encounter for antineoplastic chemotherapy: Secondary | ICD-10-CM

## 2014-12-22 LAB — COMPREHENSIVE METABOLIC PANEL (CC13)
ALBUMIN: 3.2 g/dL — AB (ref 3.5–5.0)
ALK PHOS: 90 U/L (ref 40–150)
ALT: 38 U/L (ref 0–55)
AST: 39 U/L — AB (ref 5–34)
Anion Gap: 8 mEq/L (ref 3–11)
BUN: 10.2 mg/dL (ref 7.0–26.0)
CO2: 30 mEq/L — ABNORMAL HIGH (ref 22–29)
Calcium: 9.9 mg/dL (ref 8.4–10.4)
Chloride: 102 mEq/L (ref 98–109)
Creatinine: 0.8 mg/dL (ref 0.6–1.1)
GLUCOSE: 115 mg/dL (ref 70–140)
Potassium: 3.8 mEq/L (ref 3.5–5.1)
Sodium: 141 mEq/L (ref 136–145)
Total Bilirubin: 0.3 mg/dL (ref 0.20–1.20)
Total Protein: 6.8 g/dL (ref 6.4–8.3)

## 2014-12-22 LAB — CBC WITH DIFFERENTIAL/PLATELET
BASO%: 0.5 % (ref 0.0–2.0)
Basophils Absolute: 0 10*3/uL (ref 0.0–0.1)
EOS ABS: 0 10*3/uL (ref 0.0–0.5)
EOS%: 0.3 % (ref 0.0–7.0)
HCT: 31.4 % — ABNORMAL LOW (ref 34.8–46.6)
HGB: 10.1 g/dL — ABNORMAL LOW (ref 11.6–15.9)
LYMPH%: 20.6 % (ref 14.0–49.7)
MCH: 28.9 pg (ref 25.1–34.0)
MCHC: 32 g/dL (ref 31.5–36.0)
MCV: 90.2 fL (ref 79.5–101.0)
MONO#: 0.3 10*3/uL (ref 0.1–0.9)
MONO%: 5.9 % (ref 0.0–14.0)
NEUT#: 3.2 10*3/uL (ref 1.5–6.5)
NEUT%: 72.7 % (ref 38.4–76.8)
PLATELETS: 344 10*3/uL (ref 145–400)
RBC: 3.48 10*6/uL — AB (ref 3.70–5.45)
RDW: 19 % — ABNORMAL HIGH (ref 11.2–14.5)
WBC: 4.5 10*3/uL (ref 3.9–10.3)
lymph#: 0.9 10*3/uL (ref 0.9–3.3)

## 2014-12-22 MED ORDER — PACLITAXEL PROTEIN-BOUND CHEMO INJECTION 100 MG
80.0000 mg/m2 | Freq: Once | INTRAVENOUS | Status: AC
  Administered 2014-12-22: 175 mg via INTRAVENOUS
  Filled 2014-12-22: qty 35

## 2014-12-22 MED ORDER — SODIUM CHLORIDE 0.9 % IV SOLN
Freq: Once | INTRAVENOUS | Status: AC
  Administered 2014-12-22: 13:00:00 via INTRAVENOUS
  Filled 2014-12-22: qty 4

## 2014-12-22 MED ORDER — SODIUM CHLORIDE 0.9 % IJ SOLN
10.0000 mL | INTRAMUSCULAR | Status: DC | PRN
  Administered 2014-12-22: 10 mL
  Filled 2014-12-22: qty 10

## 2014-12-22 MED ORDER — RIVAROXABAN 20 MG PO TABS: 20.0000 mg | ORAL_TABLET | Freq: Every day | ORAL | Status: DC

## 2014-12-22 MED ORDER — LIDOCAINE-PRILOCAINE 2.5-2.5 % EX CREA
TOPICAL_CREAM | CUTANEOUS | Status: DC
Start: 2014-12-22 — End: 2015-02-22

## 2014-12-22 MED ORDER — SODIUM CHLORIDE 0.9 % IV SOLN
Freq: Once | INTRAVENOUS | Status: AC
  Administered 2014-12-22: 13:00:00 via INTRAVENOUS

## 2014-12-22 MED ORDER — METOPROLOL TARTRATE 25 MG PO TABS: 25.0000 mg | ORAL_TABLET | Freq: Two times a day (BID) | ORAL | Status: DC

## 2014-12-22 MED ORDER — HEPARIN SOD (PORK) LOCK FLUSH 100 UNIT/ML IV SOLN
500.0000 [IU] | Freq: Once | INTRAVENOUS | Status: AC | PRN
  Administered 2014-12-22: 500 [IU]
  Filled 2014-12-22: qty 5

## 2014-12-22 NOTE — Telephone Encounter (Signed)
Appointments made and avs printed for patient °

## 2014-12-22 NOTE — Assessment & Plan Note (Signed)
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 Alopecia 2. Chemotherapy-induced anemia grade 1 3. Sinus pressure: On over-the-counter cold and sinus medications. Resolved 4. Nausea with Taxol carbo chemotherapy: I changed antiemetic regimen from Zofran to Aloxi IV 5. 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 Discontinue Taxol and carboplatin and switched to Abraxane with week 5. On Abraxane the neuropathy is stable.  Pulmonary embolism: Currently on Xarelto. We sent a prescription for the maintenance dosage and Zarontin  Monitoring closely for toxicities

## 2014-12-22 NOTE — Progress Notes (Signed)
Patient Care Team: Kelton Pillar, MD as PCP - General (Family Medicine) Nicholas Lose, MD as Consulting Physician (Hematology and Oncology) Excell Seltzer, MD as Consulting Physician (General Surgery) Eppie Gibson, MD as Attending Physician (Radiation Oncology) Holley Bouche, NP as Nurse Practitioner (Nurse Practitioner)  DIAGNOSIS: Breast cancer of upper-outer quadrant of right female breast   Staging form: Breast, AJCC 7th Edition     Clinical: Stage IIIA (T3, N1, M0) - Unsigned       Staging comments: Staged at breast conference 11.11.15    SUMMARY OF ONCOLOGIC HISTORY:   Breast cancer of upper-outer quadrant of right female breast   08/05/2014 Initial Diagnosis Invasive ductal carcinoma, 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, grade 3 ER 0%, PR 0%, HER-2 negative, Ki-67 90%: Right axillary lymph node positive for breast cancer   08/12/2014 Breast MRI 4.5x 7.5 x4 cm biopsy-proven right breast upper outer quadrant cancer with biopsy-proven level I right axillary lymph node with 2 level II right axillary lymph nodes identified   09/08/2014 -  Neo-Adjuvant Chemotherapy Dose dense Adriamycin and Cytoxan x4 followed by weekly Taxol and carboplatin x4; Abraxane 8    CHIEF COMPLIANT: Abraxane weeks 6  INTERVAL HISTORY: Jamie Edwards is a 45 year old with above-mentioned history of right breast cancer currently on neoadjuvant chemotherapy. She could not tolerate weekly Taxol and carboplatin and hence we switched her to Abraxane. She appears to be tolerating this much better. She denies any neuropathy worsening. She has intermittent pain in the right breast. Denies any nausea or vomiting.  REVIEW OF SYSTEMS:   Constitutional: Denies fevers, chills or abnormal weight loss Eyes: Denies blurriness of vision Ears, nose, mouth, throat, and face: Denies mucositis or sore throat Respiratory: Denies cough, dyspnea or wheezes Cardiovascular: Denies palpitation,  chest discomfort or lower extremity swelling Gastrointestinal:  Denies nausea, heartburn or change in bowel habits Skin: Denies abnormal skin rashes Lymphatics: Denies new lymphadenopathy or easy bruising Neurological:neuropathy grade 1-2 Behavioral/Psych: Mood is stable, no new changes  Breast: intermittent pain in the right breast All other systems were reviewed with the patient and are negative.  I have reviewed the past medical history, past surgical history, social history and family history with the patient and they are unchanged from previous note.  ALLERGIES:  is allergic to penicillins and zithromax.  MEDICATIONS:  Current Outpatient Prescriptions  Medication Sig Dispense Refill  . ALPRAZolam (XANAX) 0.25 MG tablet Take 0.25 mg by mouth 2 (two) times daily as needed for anxiety.    Marland Kitchen dexamethasone (DECADRON) 4 MG tablet TAKE 2 TABS WITH FOOD, ONCE A DAY ON THE DAY AFTER CHEMO, THEN TAKE 2 TABS TWICE DAILY FOR 2 DAYS, 30 tablet 2  . HYDROcodone-homatropine (HYCODAN) 5-1.5 MG/5ML syrup Take 5 mLs by mouth every 6 (six) hours as needed for cough. 60 mL 0  . LORazepam (ATIVAN) 0.5 MG tablet TAKE 1 TABLET BY MOUTH EVERY 6 HOURS AS NEEDED FOR NAUSEA/VOMITING 30 tablet 0  . LORazepam (ATIVAN) 0.5 MG tablet Take 1 tablet (0.5 mg total) by mouth every 6 (six) hours as needed (Nausea or vomiting). 30 tablet 0  . ondansetron (ZOFRAN) 8 MG tablet Take 1 tablet (8 mg total) by mouth 2 (two) times daily as needed. Start on the third day after chemotherapy. 30 tablet 1  . ondansetron (ZOFRAN) 8 MG tablet Take 1 tablet (8 mg total) by mouth 2 (two) times daily. Start the day after chemo for 2 days. Then take as needed for nausea  or vomiting. 30 tablet 1  . oxyCODONE-acetaminophen (ROXICET) 5-325 MG per tablet Take 1-2 tablets by mouth every 4 (four) hours as needed. (Patient taking differently: Take 1-2 tablets by mouth every 4 (four) hours as needed for moderate pain. ) 30 tablet 0  . PRESCRIPTION  MEDICATION every 7 (seven) days. Chemotherapy at [facility]) every 7 days.    . prochlorperazine (COMPAZINE) 10 MG tablet TAKE 1 TABLET (10 MG TOTAL) BY MOUTH EVERY 6 (SIX) HOURS AS NEEDED (NAUSEA OR VOMITING). 30 tablet 2  . prochlorperazine (COMPAZINE) 10 MG tablet Take 1 tablet (10 mg total) by mouth every 6 (six) hours as needed (Nausea or vomiting). 30 tablet 1  . Rivaroxaban (XARELTO STARTER PACK) 15 & 20 MG TBPK Take as directed on package: Start with one $Remove'15mg'TfuGYZg$  tablet by mouth twice a day with food. On Day 22, switch to one $Remo'20mg'iBwFf$  tablet once a day with food. 51 each 0  . lidocaine-prilocaine (EMLA) cream Apply to affected area once 30 g 3  . metoprolol tartrate (LOPRESSOR) 25 MG tablet Take 1 tablet (25 mg total) by mouth 2 (two) times daily. 60 tablet 0  . rivaroxaban (XARELTO) 20 MG TABS tablet Take 1 tablet (20 mg total) by mouth daily with supper. 30 tablet 2   No current facility-administered medications for this visit.    PHYSICAL EXAMINATION: ECOG PERFORMANCE STATUS: 1 - Symptomatic but completely ambulatory  Filed Vitals:   12/22/14 1125  BP: 102/57  Pulse: 88  Temp: 98.1 F (36.7 C)  Resp: 18   Filed Weights   12/22/14 1125  Weight: 203 lb (92.08 kg)    GENERAL:alert, no distress and comfortable SKIN: skin color, texture, turgor are normal, no rashes or significant lesions EYES: normal, Conjunctiva are pink and non-injected, sclera clear OROPHARYNX:no exudate, no erythema and lips, buccal mucosa, and tongue normal  NECK: supple, thyroid normal size, non-tender, without nodularity LYMPH:  no palpable lymphadenopathy in the cervical, axillary or inguinal LUNGS: clear to auscultation and percussion with normal breathing effort HEART: regular rate & rhythm and no murmurs and no lower extremity edema ABDOMEN:abdomen soft, non-tender and normal bowel sounds Musculoskeletal:no cyanosis of digits and no clubbing  NEURO: grade 1-2 peripheral neuropathy  stable   LABORATORY DATA:  I have reviewed the data as listed   Chemistry      Component Value Date/Time   NA 141 12/22/2014 1037   NA 138 12/02/2014 0525   K 3.8 12/22/2014 1037   K 3.4* 12/03/2014 0510   CL 97 12/02/2014 0525   CO2 30* 12/22/2014 1037   CO2 31 12/02/2014 0525   BUN 10.2 12/22/2014 1037   BUN 16 12/02/2014 0525   CREATININE 0.8 12/22/2014 1037   CREATININE 0.75 12/02/2014 0525      Component Value Date/Time   CALCIUM 9.9 12/22/2014 1037   CALCIUM 9.2 12/02/2014 0525   ALKPHOS 90 12/22/2014 1037   AST 39* 12/22/2014 1037   ALT 38 12/22/2014 1037   BILITOT 0.30 12/22/2014 1037       Lab Results  Component Value Date   WBC 4.5 12/22/2014   HGB 10.1* 12/22/2014   HCT 31.4* 12/22/2014   MCV 90.2 12/22/2014   PLT 344 12/22/2014   NEUTROABS 3.2 12/22/2014    ASSESSMENT & PLAN:  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 Alopecia 2. Chemotherapy-induced anemia grade 1 3. Sinus pressure: On over-the-counter cold and sinus medications. Resolved 4. Nausea with Taxol carbo chemotherapy: I changed antiemetic regimen from Zofran to Aloxi IV 5. 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 Discontinue Taxol and carboplatin and switched to Abraxane with week 5. On Abraxane the  neuropathy is stable.  Pulmonary embolism: Currently on Xarelto. We sent a prescription for the maintenance dosage and Zarontin  Monitoring closely for toxicities     No orders of the defined types were placed in this encounter.   The patient has a good understanding of the overall plan. she agrees with it. She will call with any problems that may develop before her next visit here.   Rulon Eisenmenger, MD

## 2014-12-22 NOTE — Patient Instructions (Signed)
[facility] Discharge Instructions for Patients Receiving Chemotherapy  Today you received the following chemotherapy agents Abraxne  To help prevent nausea and vomiting after your treatment, we encourage you to take your nausea medication as directed/prescribed    If you develop nausea and vomiting that is not controlled by your nausea medication, call the clinic.   BELOW ARE SYMPTOMS THAT SHOULD BE REPORTED IMMEDIATELY:  *FEVER GREATER THAN 100.5 F  *CHILLS WITH OR WITHOUT FEVER  NAUSEA AND VOMITING THAT IS NOT CONTROLLED WITH YOUR NAUSEA MEDICATION  *UNUSUAL SHORTNESS OF BREATH  *UNUSUAL BRUISING OR BLEEDING  TENDERNESS IN MOUTH AND THROAT WITH OR WITHOUT PRESENCE OF ULCERS  *URINARY PROBLEMS  *BOWEL PROBLEMS  UNUSUAL RASH Items with * indicate a potential emergency and should be followed up as soon as possible.  Feel free to call the clinic you have any questions or concerns. The clinic phone number is [phone].  Please show the [place] at check to the Emergency Department and triage nurse.

## 2014-12-29 ENCOUNTER — Ambulatory Visit (HOSPITAL_BASED_OUTPATIENT_CLINIC_OR_DEPARTMENT_OTHER): Payer: 59

## 2014-12-29 ENCOUNTER — Other Ambulatory Visit (HOSPITAL_BASED_OUTPATIENT_CLINIC_OR_DEPARTMENT_OTHER): Payer: 59

## 2014-12-29 DIAGNOSIS — C50411 Malignant neoplasm of upper-outer quadrant of right female breast: Secondary | ICD-10-CM | POA: Diagnosis not present

## 2014-12-29 DIAGNOSIS — Z5111 Encounter for antineoplastic chemotherapy: Secondary | ICD-10-CM | POA: Diagnosis not present

## 2014-12-29 DIAGNOSIS — C773 Secondary and unspecified malignant neoplasm of axilla and upper limb lymph nodes: Secondary | ICD-10-CM

## 2014-12-29 LAB — COMPREHENSIVE METABOLIC PANEL (CC13)
ALBUMIN: 3.3 g/dL — AB (ref 3.5–5.0)
ALK PHOS: 86 U/L (ref 40–150)
ALT: 36 U/L (ref 0–55)
ANION GAP: 10 meq/L (ref 3–11)
AST: 34 U/L (ref 5–34)
BUN: 8.1 mg/dL (ref 7.0–26.0)
CALCIUM: 9.9 mg/dL (ref 8.4–10.4)
CO2: 30 mEq/L — ABNORMAL HIGH (ref 22–29)
Chloride: 104 mEq/L (ref 98–109)
Creatinine: 0.8 mg/dL (ref 0.6–1.1)
Glucose: 135 mg/dl (ref 70–140)
Potassium: 3.9 mEq/L (ref 3.5–5.1)
Sodium: 143 mEq/L (ref 136–145)
Total Bilirubin: 0.28 mg/dL (ref 0.20–1.20)
Total Protein: 6.7 g/dL (ref 6.4–8.3)

## 2014-12-29 LAB — CBC WITH DIFFERENTIAL/PLATELET
BASO%: 0.9 % (ref 0.0–2.0)
Basophils Absolute: 0 10*3/uL (ref 0.0–0.1)
EOS ABS: 0 10*3/uL (ref 0.0–0.5)
EOS%: 0.3 % (ref 0.0–7.0)
HCT: 30.6 % — ABNORMAL LOW (ref 34.8–46.6)
HEMOGLOBIN: 9.8 g/dL — AB (ref 11.6–15.9)
LYMPH%: 29.3 % (ref 14.0–49.7)
MCH: 28.6 pg (ref 25.1–34.0)
MCHC: 31.9 g/dL (ref 31.5–36.0)
MCV: 89.6 fL (ref 79.5–101.0)
MONO#: 0.2 10*3/uL (ref 0.1–0.9)
MONO%: 7.1 % (ref 0.0–14.0)
NEUT%: 62.4 % (ref 38.4–76.8)
NEUTROS ABS: 2.1 10*3/uL (ref 1.5–6.5)
Platelets: 366 10*3/uL (ref 145–400)
RBC: 3.42 10*6/uL — AB (ref 3.70–5.45)
RDW: 18.3 % — AB (ref 11.2–14.5)
WBC: 3.4 10*3/uL — ABNORMAL LOW (ref 3.9–10.3)
lymph#: 1 10*3/uL (ref 0.9–3.3)

## 2014-12-29 MED ORDER — SODIUM CHLORIDE 0.9 % IV SOLN
Freq: Once | INTRAVENOUS | Status: AC
  Administered 2014-12-29: 10:00:00 via INTRAVENOUS
  Filled 2014-12-29: qty 4

## 2014-12-29 MED ORDER — SODIUM CHLORIDE 0.9 % IJ SOLN
10.0000 mL | INTRAMUSCULAR | Status: DC | PRN
  Administered 2014-12-29: 10 mL
  Filled 2014-12-29: qty 10

## 2014-12-29 MED ORDER — SODIUM CHLORIDE 0.9 % IV SOLN
Freq: Once | INTRAVENOUS | Status: AC
  Administered 2014-12-29: 10:00:00 via INTRAVENOUS

## 2014-12-29 MED ORDER — PACLITAXEL PROTEIN-BOUND CHEMO INJECTION 100 MG
80.0000 mg/m2 | Freq: Once | INTRAVENOUS | Status: AC
  Administered 2014-12-29: 175 mg via INTRAVENOUS
  Filled 2014-12-29: qty 35

## 2014-12-29 MED ORDER — HEPARIN SOD (PORK) LOCK FLUSH 100 UNIT/ML IV SOLN
500.0000 [IU] | Freq: Once | INTRAVENOUS | Status: AC | PRN
  Administered 2014-12-29: 500 [IU]
  Filled 2014-12-29: qty 5

## 2014-12-29 NOTE — Patient Instructions (Signed)
[facility] Discharge Instructions for Patients Receiving Chemotherapy  Today you received the following chemotherapy agents: Abraxane.  To help prevent nausea and vomiting after your treatment, we encourage you to take your nausea medication: Compazine 10 mg every 6 hours; Zofran 8 mg every12 hours as needed.   If you develop nausea and vomiting that is not controlled by your nausea medication, call the clinic.   BELOW ARE SYMPTOMS THAT SHOULD BE REPORTED IMMEDIATELY:  *FEVER GREATER THAN 100.5 F  *CHILLS WITH OR WITHOUT FEVER  NAUSEA AND VOMITING THAT IS NOT CONTROLLED WITH YOUR NAUSEA MEDICATION  *UNUSUAL SHORTNESS OF BREATH  *UNUSUAL BRUISING OR BLEEDING  TENDERNESS IN MOUTH AND THROAT WITH OR WITHOUT PRESENCE OF ULCERS  *URINARY PROBLEMS  *BOWEL PROBLEMS  UNUSUAL RASH Items with * indicate a potential emergency and should be followed up as soon as possible.  Feel free to call the clinic you have any questions or concerns. The clinic phone number is [phone].  Please show the [place] at check-in to the Emergency Department and triage nurse.

## 2015-01-04 ENCOUNTER — Other Ambulatory Visit (HOSPITAL_BASED_OUTPATIENT_CLINIC_OR_DEPARTMENT_OTHER): Payer: 59

## 2015-01-04 ENCOUNTER — Telehealth: Payer: Self-pay | Admitting: Hematology and Oncology

## 2015-01-04 ENCOUNTER — Ambulatory Visit (HOSPITAL_BASED_OUTPATIENT_CLINIC_OR_DEPARTMENT_OTHER): Payer: 59 | Admitting: Hematology and Oncology

## 2015-01-04 VITALS — BP 100/58 | HR 74 | Temp 98.9°F | Resp 18 | Ht 68.0 in | Wt 204.0 lb

## 2015-01-04 DIAGNOSIS — C773 Secondary and unspecified malignant neoplasm of axilla and upper limb lymph nodes: Secondary | ICD-10-CM

## 2015-01-04 DIAGNOSIS — D6481 Anemia due to antineoplastic chemotherapy: Secondary | ICD-10-CM

## 2015-01-04 DIAGNOSIS — G620 Drug-induced polyneuropathy: Secondary | ICD-10-CM | POA: Diagnosis not present

## 2015-01-04 DIAGNOSIS — D709 Neutropenia, unspecified: Secondary | ICD-10-CM

## 2015-01-04 DIAGNOSIS — C50411 Malignant neoplasm of upper-outer quadrant of right female breast: Secondary | ICD-10-CM | POA: Diagnosis not present

## 2015-01-04 DIAGNOSIS — I2699 Other pulmonary embolism without acute cor pulmonale: Secondary | ICD-10-CM

## 2015-01-04 DIAGNOSIS — R110 Nausea: Secondary | ICD-10-CM

## 2015-01-04 LAB — COMPREHENSIVE METABOLIC PANEL (CC13)
ALT: 45 U/L (ref 0–55)
AST: 49 U/L — ABNORMAL HIGH (ref 5–34)
Albumin: 3.5 g/dL (ref 3.5–5.0)
Alkaline Phosphatase: 96 U/L (ref 40–150)
Anion Gap: 10 mEq/L (ref 3–11)
BUN: 7.2 mg/dL (ref 7.0–26.0)
CO2: 30 meq/L — AB (ref 22–29)
Calcium: 9.9 mg/dL (ref 8.4–10.4)
Chloride: 103 mEq/L (ref 98–109)
Creatinine: 0.8 mg/dL (ref 0.6–1.1)
Glucose: 116 mg/dl (ref 70–140)
Potassium: 4.2 mEq/L (ref 3.5–5.1)
SODIUM: 144 meq/L (ref 136–145)
Total Bilirubin: 0.34 mg/dL (ref 0.20–1.20)
Total Protein: 7 g/dL (ref 6.4–8.3)

## 2015-01-04 LAB — CBC WITH DIFFERENTIAL/PLATELET
BASO%: 1 % (ref 0.0–2.0)
BASOS ABS: 0 10*3/uL (ref 0.0–0.1)
EOS%: 0.9 % (ref 0.0–7.0)
Eosinophils Absolute: 0 10*3/uL (ref 0.0–0.5)
HCT: 30.8 % — ABNORMAL LOW (ref 34.8–46.6)
HGB: 9.9 g/dL — ABNORMAL LOW (ref 11.6–15.9)
LYMPH%: 40.1 % (ref 14.0–49.7)
MCH: 29.1 pg (ref 25.1–34.0)
MCHC: 32.1 g/dL (ref 31.5–36.0)
MCV: 90.7 fL (ref 79.5–101.0)
MONO#: 0.2 10*3/uL (ref 0.1–0.9)
MONO%: 7.2 % (ref 0.0–14.0)
NEUT#: 1.3 10*3/uL — ABNORMAL LOW (ref 1.5–6.5)
NEUT%: 50.8 % (ref 38.4–76.8)
PLATELETS: 362 10*3/uL (ref 145–400)
RBC: 3.4 10*6/uL — ABNORMAL LOW (ref 3.70–5.45)
RDW: 18.2 % — AB (ref 11.2–14.5)
WBC: 2.5 10*3/uL — ABNORMAL LOW (ref 3.9–10.3)
lymph#: 1 10*3/uL (ref 0.9–3.3)

## 2015-01-04 NOTE — Telephone Encounter (Signed)
appointments made and avs will be printed for patient at 3/31 appointment

## 2015-01-04 NOTE — Progress Notes (Signed)
Patient Care Team: Kelton Pillar, MD as PCP - General (Family Medicine) Nicholas Lose, MD as Consulting Physician (Hematology and Oncology) Excell Seltzer, MD as Consulting Physician (General Surgery) Eppie Gibson, MD as Attending Physician (Radiation Oncology) Holley Bouche, NP as Nurse Practitioner (Nurse Practitioner)  DIAGNOSIS: Breast cancer of upper-outer quadrant of right female breast   Staging form: Breast, AJCC 7th Edition     Clinical: Stage IIIA (T3, N1, M0) - Unsigned       Staging comments: Staged at breast conference 11.11.15    SUMMARY OF ONCOLOGIC HISTORY:   Breast cancer of upper-outer quadrant of right female breast   08/05/2014 Initial Diagnosis Invasive ductal carcinoma, 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, grade 3 ER 0%, PR 0%, HER-2 negative, Ki-67 90%: Right axillary lymph node positive for breast cancer   08/12/2014 Breast MRI 4.5x 7.5 x4 cm biopsy-proven right breast upper outer quadrant cancer with biopsy-proven level I right axillary lymph node with 2 level II right axillary lymph nodes identified   09/08/2014 -  Neo-Adjuvant Chemotherapy Dose dense Adriamycin and Cytoxan x4 followed by weekly Taxol and carboplatin x4; Abraxane 8    CHIEF COMPLIANT: Week 8 Abraxane  INTERVAL HISTORY: Jamie Edwards is a 44 year old with above-mentioned history of right-sided breast cancer currently on neoadjuvant chemotherapy. Today is week 8 of Abraxane/12. She has been tolerating it much better. Neuropathy the tips of the toes is stable. It does bother her at night. But she does not want to take any medication for it at this time. She also notices occasional nosebleeds. Denies any nausea vomiting.  REVIEW OF SYSTEMS:   Constitutional: Denies fevers, chills or abnormal weight loss Eyes: Denies blurriness of vision Ears, nose, mouth, throat, and face: Denies mucositis or sore throat Respiratory: Denies cough, dyspnea or  wheezes Cardiovascular: Denies palpitation, chest discomfort or lower extremity swelling Gastrointestinal:  Denies nausea, heartburn or change in bowel habits Skin: Denies abnormal skin rashes Lymphatics: Denies new lymphadenopathy or easy bruising Neurological:Denies numbness, tingling or new weaknesses Behavioral/Psych: Mood is stable, no new changes  Breast:  denies any pain or lumps or nodules in either breasts All other systems were reviewed with the patient and are negative.  I have reviewed the past medical history, past surgical history, social history and family history with the patient and they are unchanged from previous note.  ALLERGIES:  is allergic to penicillins and zithromax.  MEDICATIONS:  Current Outpatient Prescriptions  Medication Sig Dispense Refill  . ALPRAZolam (XANAX) 0.25 MG tablet Take 0.25 mg by mouth 2 (two) times daily as needed for anxiety.    Marland Kitchen dexamethasone (DECADRON) 4 MG tablet TAKE 2 TABS WITH FOOD, ONCE A DAY ON THE DAY AFTER CHEMO, THEN TAKE 2 TABS TWICE DAILY FOR 2 DAYS, 30 tablet 2  . HYDROcodone-homatropine (HYCODAN) 5-1.5 MG/5ML syrup Take 5 mLs by mouth every 6 (six) hours as needed for cough. 60 mL 0  . lidocaine-prilocaine (EMLA) cream Apply to affected area once 30 g 3  . LORazepam (ATIVAN) 0.5 MG tablet TAKE 1 TABLET BY MOUTH EVERY 6 HOURS AS NEEDED FOR NAUSEA/VOMITING 30 tablet 0  . LORazepam (ATIVAN) 0.5 MG tablet Take 1 tablet (0.5 mg total) by mouth every 6 (six) hours as needed (Nausea or vomiting). 30 tablet 0  . metoprolol tartrate (LOPRESSOR) 25 MG tablet Take 1 tablet (25 mg total) by mouth 2 (two) times daily. 60 tablet 0  . ondansetron (ZOFRAN) 8 MG tablet Take 1 tablet (8  mg total) by mouth 2 (two) times daily as needed. Start on the third day after chemotherapy. 30 tablet 1  . ondansetron (ZOFRAN) 8 MG tablet Take 1 tablet (8 mg total) by mouth 2 (two) times daily. Start the day after chemo for 2 days. Then take as needed for nausea  or vomiting. 30 tablet 1  . oxyCODONE-acetaminophen (ROXICET) 5-325 MG per tablet Take 1-2 tablets by mouth every 4 (four) hours as needed. (Patient taking differently: Take 1-2 tablets by mouth every 4 (four) hours as needed for moderate pain. ) 30 tablet 0  . PRESCRIPTION MEDICATION every 7 (seven) days. Chemotherapy at [facility] [place]) every 7 days.    . prochlorperazine (COMPAZINE) 10 MG tablet TAKE 1 TABLET (10 MG TOTAL) BY MOUTH EVERY 6 (SIX) HOURS AS NEEDED (NAUSEA OR VOMITING). 30 tablet 2  . prochlorperazine (COMPAZINE) 10 MG tablet Take 1 tablet (10 mg total) by mouth every 6 (six) hours as needed (Nausea or vomiting). 30 tablet 1  . Rivaroxaban (XARELTO STARTER PACK) 15 & 20 MG TBPK Take as directed on package: Start with one $Remove'15mg'ZQIfXIA$  tablet by mouth twice a day with food. On Day 22, switch to one $Remo'20mg'Pvyju$  tablet once a day with food. 51 each 0  . rivaroxaban (XARELTO) 20 MG TABS tablet Take 1 tablet (20 mg total) by mouth daily with supper. 30 tablet 2   No current facility-administered medications for this visit.    PHYSICAL EXAMINATION: ECOG PERFORMANCE STATUS: 1 - Symptomatic but completely ambulatory  Filed Vitals:   01/04/15 1013  BP: 100/58  Pulse: 74  Temp: 98.9 F (37.2 C)  Resp: 18   Filed Weights   01/04/15 1013  Weight: 204 lb (92.534 kg)    GENERAL:alert, no distress and comfortable SKIN: skin color, texture, turgor are normal, no rashes or significant lesions EYES: normal, Conjunctiva are pink and non-injected, sclera clear OROPHARYNX:no exudate, no erythema and lips, buccal mucosa, and tongue normal  NECK: supple, thyroid normal size, non-tender, without nodularity LYMPH:  no palpable lymphadenopathy in the cervical, axillary or inguinal LUNGS: clear to auscultation and percussion with normal breathing effort HEART: regular rate & rhythm and no murmurs and no lower extremity edema ABDOMEN:abdomen soft, non-tender and normal bowel sounds Musculoskeletal:no  cyanosis of digits and no clubbing  NEURO: alert & oriented x 3 with fluent speech, no focal motor/sensory deficits  LABORATORY DATA:  I have reviewed the data as listed   Chemistry      Component Value Date/Time   NA 144 01/04/2015 1000   NA 138 12/02/2014 0525   K 4.2 01/04/2015 1000   K 3.4* 12/03/2014 0510   CL 97 12/02/2014 0525   CO2 30* 01/04/2015 1000   CO2 31 12/02/2014 0525   BUN 7.2 01/04/2015 1000   BUN 16 12/02/2014 0525   CREATININE 0.8 01/04/2015 1000   CREATININE 0.75 12/02/2014 0525      Component Value Date/Time   CALCIUM 9.9 01/04/2015 1000   CALCIUM 9.2 12/02/2014 0525   ALKPHOS 96 01/04/2015 1000   AST 49* 01/04/2015 1000   ALT 45 01/04/2015 1000   BILITOT 0.34 01/04/2015 1000       Lab Results  Component Value Date   WBC 2.5* 01/04/2015   HGB 9.9* 01/04/2015   HCT 30.8* 01/04/2015   MCV 90.7 01/04/2015   PLT 362 01/04/2015   NEUTROABS 1.3* 01/04/2015    ASSESSMENT & PLAN:  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 Alopecia 2. Chemotherapy-induced anemia grade 1 3. Sinus pressure: On over-the-counter cold and sinus medications. Resolved 4. Nausea with Taxol carbo chemotherapy: I changed antiemetic regimen from Zofran to Aloxi IV 5. 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 Discontinue Taxol and carboplatin and  switched to Abraxane with week 5. On Abraxane the neuropathy is stable.  Pulmonary embolism: Currently on Xarelto. We sent a prescription for the maintenance dosage and Zarontin Neutropenia: Today her ANC is 1300. She has lab work for tomorrow. If her Fy[place] is less than 1500, I instructed that the patient should get Neupogen 480 g subcutaneous on Friday and Saturday. I have decreased the dosage of chemotherapy as well.  Monitoring closely for toxicities  No orders of the defined types were placed in this encounter.   The patient has a good understanding of the overall plan. she agrees with it. She will call with any problems that may develop before her next visit here.   Rulon Eisenmenger, MD

## 2015-01-05 ENCOUNTER — Other Ambulatory Visit (HOSPITAL_BASED_OUTPATIENT_CLINIC_OR_DEPARTMENT_OTHER): Payer: 59

## 2015-01-05 ENCOUNTER — Ambulatory Visit (HOSPITAL_BASED_OUTPATIENT_CLINIC_OR_DEPARTMENT_OTHER): Payer: 59

## 2015-01-05 DIAGNOSIS — C50411 Malignant neoplasm of upper-outer quadrant of right female breast: Secondary | ICD-10-CM

## 2015-01-05 DIAGNOSIS — C773 Secondary and unspecified malignant neoplasm of axilla and upper limb lymph nodes: Secondary | ICD-10-CM | POA: Diagnosis not present

## 2015-01-05 DIAGNOSIS — Z5111 Encounter for antineoplastic chemotherapy: Secondary | ICD-10-CM

## 2015-01-05 LAB — COMPREHENSIVE METABOLIC PANEL (CC13)
ALT: 39 U/L (ref 0–55)
AST: 41 U/L — ABNORMAL HIGH (ref 5–34)
Albumin: 3.3 g/dL — ABNORMAL LOW (ref 3.5–5.0)
Alkaline Phosphatase: 90 U/L (ref 40–150)
Anion Gap: 9 meq/L (ref 3–11)
BUN: 7.4 mg/dL (ref 7.0–26.0)
CO2: 30 meq/L — ABNORMAL HIGH (ref 22–29)
Calcium: 9.9 mg/dL (ref 8.4–10.4)
Chloride: 102 meq/L (ref 98–109)
Creatinine: 0.8 mg/dL (ref 0.6–1.1)
EGFR: >90 ml/min/1.73 m2
Glucose: 117 mg/dL (ref 70–140)
Potassium: 4 meq/L (ref 3.5–5.1)
Sodium: 141 meq/L (ref 136–145)
Total Bilirubin: 0.28 mg/dL (ref 0.20–1.20)
Total Protein: 6.6 g/dL (ref 6.4–8.3)

## 2015-01-05 LAB — CBC WITH DIFFERENTIAL/PLATELET
BASO%: 1 % (ref 0.0–2.0)
Basophils Absolute: 0 10*3/uL (ref 0.0–0.1)
EOS%: 0.4 % (ref 0.0–7.0)
Eosinophils Absolute: 0 10*3/uL (ref 0.0–0.5)
HCT: 29.4 % — ABNORMAL LOW (ref 34.8–46.6)
HGB: 9.5 g/dL — ABNORMAL LOW (ref 11.6–15.9)
LYMPH%: 30.2 % (ref 14.0–49.7)
MCH: 29.2 pg (ref 25.1–34.0)
MCHC: 32.2 g/dL (ref 31.5–36.0)
MCV: 90.8 fL (ref 79.5–101.0)
MONO#: 0.2 10*3/uL (ref 0.1–0.9)
MONO%: 6.9 % (ref 0.0–14.0)
NEUT#: 2.2 10*3/uL (ref 1.5–6.5)
NEUT%: 61.5 % (ref 38.4–76.8)
Platelets: 359 10*3/uL (ref 145–400)
RBC: 3.23 10*6/uL — ABNORMAL LOW (ref 3.70–5.45)
RDW: 18.4 % — ABNORMAL HIGH (ref 11.2–14.5)
WBC: 3.6 10*3/uL — ABNORMAL LOW (ref 3.9–10.3)
lymph#: 1.1 10*3/uL (ref 0.9–3.3)

## 2015-01-05 MED ORDER — SODIUM CHLORIDE 0.9 % IV SOLN
Freq: Once | INTRAVENOUS | Status: AC
  Administered 2015-01-05: 10:00:00 via INTRAVENOUS

## 2015-01-05 MED ORDER — HEPARIN SOD (PORK) LOCK FLUSH 100 UNIT/ML IV SOLN
500.0000 [IU] | Freq: Once | INTRAVENOUS | Status: AC | PRN
  Administered 2015-01-05: 500 [IU]
  Filled 2015-01-05: qty 5

## 2015-01-05 MED ORDER — PACLITAXEL PROTEIN-BOUND CHEMO INJECTION 100 MG
70.0000 mg/m2 | Freq: Once | INTRAVENOUS | Status: AC
  Administered 2015-01-05: 150 mg via INTRAVENOUS
  Filled 2015-01-05: qty 30

## 2015-01-05 MED ORDER — ONDANSETRON HCL 40 MG/20ML IJ SOLN
Freq: Once | INTRAMUSCULAR | Status: AC
  Administered 2015-01-05: 11:00:00 via INTRAVENOUS
  Filled 2015-01-05: qty 4

## 2015-01-05 MED ORDER — SODIUM CHLORIDE 0.9 % IJ SOLN
10.0000 mL | INTRAMUSCULAR | Status: DC | PRN
  Administered 2015-01-05: 10 mL
  Filled 2015-01-05: qty 10

## 2015-01-05 NOTE — Patient Instructions (Signed)
[facility] Discharge Instructions for Patients Receiving Chemotherapy  Today you received the following chemotherapy agents Abraxane  To help prevent nausea and vomiting after your treatment, we encourage you to take your nausea medication as directed/prescribed   If you develop nausea and vomiting that is not controlled by your nausea medication, call the clinic.   BELOW ARE SYMPTOMS THAT SHOULD BE REPORTED IMMEDIATELY:  *FEVER GREATER THAN 100.5 F  *CHILLS WITH OR WITHOUT FEVER  NAUSEA AND VOMITING THAT IS NOT CONTROLLED WITH YOUR NAUSEA MEDICATION  *UNUSUAL SHORTNESS OF BREATH  *UNUSUAL BRUISING OR BLEEDING  TENDERNESS IN MOUTH AND THROAT WITH OR WITHOUT PRESENCE OF ULCERS  *URINARY PROBLEMS  *BOWEL PROBLEMS  UNUSUAL RASH Items with * indicate a potential emergency and should be followed up as soon as possible.  Feel free to call the clinic you have any questions or concerns. The clinic phone number is [phone].  Please show the [place] at check-in to the Emergency Department and triage nurse.

## 2015-01-12 ENCOUNTER — Other Ambulatory Visit (HOSPITAL_BASED_OUTPATIENT_CLINIC_OR_DEPARTMENT_OTHER): Payer: 59

## 2015-01-12 ENCOUNTER — Ambulatory Visit (HOSPITAL_BASED_OUTPATIENT_CLINIC_OR_DEPARTMENT_OTHER): Payer: 59

## 2015-01-12 DIAGNOSIS — D6481 Anemia due to antineoplastic chemotherapy: Secondary | ICD-10-CM

## 2015-01-12 DIAGNOSIS — C50411 Malignant neoplasm of upper-outer quadrant of right female breast: Secondary | ICD-10-CM

## 2015-01-12 DIAGNOSIS — D709 Neutropenia, unspecified: Secondary | ICD-10-CM

## 2015-01-12 DIAGNOSIS — Z5111 Encounter for antineoplastic chemotherapy: Secondary | ICD-10-CM

## 2015-01-12 LAB — CBC WITH DIFFERENTIAL/PLATELET
BASO%: 0.9 % (ref 0.0–2.0)
BASOS ABS: 0 10*3/uL (ref 0.0–0.1)
EOS%: 0.3 % (ref 0.0–7.0)
Eosinophils Absolute: 0 10*3/uL (ref 0.0–0.5)
HEMATOCRIT: 30.8 % — AB (ref 34.8–46.6)
HEMOGLOBIN: 9.7 g/dL — AB (ref 11.6–15.9)
LYMPH%: 31.1 % (ref 14.0–49.7)
MCH: 28.9 pg (ref 25.1–34.0)
MCHC: 31.6 g/dL (ref 31.5–36.0)
MCV: 91.5 fL (ref 79.5–101.0)
MONO#: 0.4 10*3/uL (ref 0.1–0.9)
MONO%: 11.5 % (ref 0.0–14.0)
NEUT#: 1.9 10*3/uL (ref 1.5–6.5)
NEUT%: 56.2 % (ref 38.4–76.8)
Platelets: 383 10*3/uL (ref 145–400)
RBC: 3.37 10*6/uL — ABNORMAL LOW (ref 3.70–5.45)
RDW: 18.1 % — ABNORMAL HIGH (ref 11.2–14.5)
WBC: 3.4 10*3/uL — ABNORMAL LOW (ref 3.9–10.3)
lymph#: 1.1 10*3/uL (ref 0.9–3.3)

## 2015-01-12 LAB — COMPREHENSIVE METABOLIC PANEL (CC13)
ALBUMIN: 3.4 g/dL — AB (ref 3.5–5.0)
ALT: 30 U/L (ref 0–55)
AST: 32 U/L (ref 5–34)
Alkaline Phosphatase: 87 U/L (ref 40–150)
Anion Gap: 7 mEq/L (ref 3–11)
BUN: 9.3 mg/dL (ref 7.0–26.0)
CO2: 30 mEq/L — ABNORMAL HIGH (ref 22–29)
Calcium: 9.7 mg/dL (ref 8.4–10.4)
Chloride: 104 mEq/L (ref 98–109)
Creatinine: 0.8 mg/dL (ref 0.6–1.1)
EGFR: >90 mL/min/{1.73_m2} (ref >90)
Glucose: 127 mg/dl (ref 70–140)
POTASSIUM: 4 meq/L (ref 3.5–5.1)
SODIUM: 141 meq/L (ref 136–145)
TOTAL PROTEIN: 6.5 g/dL (ref 6.4–8.3)
Total Bilirubin: 0.3 mg/dL (ref 0.20–1.20)

## 2015-01-12 MED ORDER — HEPARIN SOD (PORK) LOCK FLUSH 100 UNIT/ML IV SOLN
500.0000 [IU] | Freq: Once | INTRAVENOUS | Status: AC | PRN
  Administered 2015-01-12: 500 [IU]
  Filled 2015-01-12: qty 5

## 2015-01-12 MED ORDER — ONDANSETRON HCL 40 MG/20ML IJ SOLN
Freq: Once | INTRAMUSCULAR | Status: AC
  Administered 2015-01-12: 12:00:00 via INTRAVENOUS
  Filled 2015-01-12: qty 4

## 2015-01-12 MED ORDER — SODIUM CHLORIDE 0.9 % IJ SOLN
10.0000 mL | INTRAMUSCULAR | Status: DC | PRN
  Administered 2015-01-12: 10 mL
  Filled 2015-01-12: qty 10

## 2015-01-12 MED ORDER — SODIUM CHLORIDE 0.9 % IV SOLN
Freq: Once | INTRAVENOUS | Status: AC
  Administered 2015-01-12: 12:00:00 via INTRAVENOUS

## 2015-01-12 MED ORDER — PACLITAXEL PROTEIN-BOUND CHEMO INJECTION 100 MG
70.0000 mg/m2 | Freq: Once | INTRAVENOUS | Status: AC
  Administered 2015-01-12: 150 mg via INTRAVENOUS
  Filled 2015-01-12: qty 30

## 2015-01-12 NOTE — Patient Instructions (Signed)
[facility] Discharge Instructions for Patients Receiving Chemotherapy  Today you received the following chemotherapy agents Abraxane  To help prevent nausea and vomiting after your treatment, we encourage you to take your nausea medication as directed/prescribed   If you develop nausea and vomiting that is not controlled by your nausea medication, call the clinic.   BELOW ARE SYMPTOMS THAT SHOULD BE REPORTED IMMEDIATELY:  *FEVER GREATER THAN 100.5 F  *CHILLS WITH OR WITHOUT FEVER  NAUSEA AND VOMITING THAT IS NOT CONTROLLED WITH YOUR NAUSEA MEDICATION  *UNUSUAL SHORTNESS OF BREATH  *UNUSUAL BRUISING OR BLEEDING  TENDERNESS IN MOUTH AND THROAT WITH OR WITHOUT PRESENCE OF ULCERS  *URINARY PROBLEMS  *BOWEL PROBLEMS  UNUSUAL RASH Items with * indicate a potential emergency and should be followed up as soon as possible.  Feel free to call the clinic you have any questions or concerns. The clinic phone number is [phone].  Please show the [place] at check-in to the Emergency Department and triage nurse.

## 2015-01-18 ENCOUNTER — Telehealth: Payer: Self-pay | Admitting: Hematology and Oncology

## 2015-01-18 ENCOUNTER — Other Ambulatory Visit (HOSPITAL_BASED_OUTPATIENT_CLINIC_OR_DEPARTMENT_OTHER): Payer: 59

## 2015-01-18 ENCOUNTER — Ambulatory Visit (HOSPITAL_BASED_OUTPATIENT_CLINIC_OR_DEPARTMENT_OTHER): Payer: 59 | Admitting: Hematology and Oncology

## 2015-01-18 DIAGNOSIS — I2699 Other pulmonary embolism without acute cor pulmonale: Secondary | ICD-10-CM

## 2015-01-18 DIAGNOSIS — D701 Agranulocytosis secondary to cancer chemotherapy: Secondary | ICD-10-CM

## 2015-01-18 DIAGNOSIS — D6481 Anemia due to antineoplastic chemotherapy: Secondary | ICD-10-CM | POA: Diagnosis not present

## 2015-01-18 DIAGNOSIS — C773 Secondary and unspecified malignant neoplasm of axilla and upper limb lymph nodes: Secondary | ICD-10-CM | POA: Diagnosis not present

## 2015-01-18 DIAGNOSIS — C50411 Malignant neoplasm of upper-outer quadrant of right female breast: Secondary | ICD-10-CM

## 2015-01-18 DIAGNOSIS — Z171 Estrogen receptor negative status [ER-]: Secondary | ICD-10-CM

## 2015-01-18 DIAGNOSIS — G620 Drug-induced polyneuropathy: Secondary | ICD-10-CM

## 2015-01-18 LAB — CBC WITH DIFFERENTIAL/PLATELET
BASO%: 1.4 % (ref 0.0–2.0)
BASOS ABS: 0 10*3/uL (ref 0.0–0.1)
EOS ABS: 0 10*3/uL (ref 0.0–0.5)
EOS%: 0.5 % (ref 0.0–7.0)
HEMATOCRIT: 30.6 % — AB (ref 34.8–46.6)
HEMOGLOBIN: 9.6 g/dL — AB (ref 11.6–15.9)
LYMPH%: 37.2 % (ref 14.0–49.7)
MCH: 29.3 pg (ref 25.1–34.0)
MCHC: 31.4 g/dL — ABNORMAL LOW (ref 31.5–36.0)
MCV: 93.3 fL (ref 79.5–101.0)
MONO#: 0.1 10*3/uL (ref 0.1–0.9)
MONO%: 6.4 % (ref 0.0–14.0)
NEUT%: 54.5 % (ref 38.4–76.8)
NEUTROS ABS: 1.2 10*3/uL — AB (ref 1.5–6.5)
PLATELETS: 254 10*3/uL (ref 145–400)
RBC: 3.28 10*6/uL — ABNORMAL LOW (ref 3.70–5.45)
RDW: 16 % — ABNORMAL HIGH (ref 11.2–14.5)
WBC: 2.2 10*3/uL — ABNORMAL LOW (ref 3.9–10.3)
lymph#: 0.8 10*3/uL — ABNORMAL LOW (ref 0.9–3.3)

## 2015-01-18 LAB — COMPREHENSIVE METABOLIC PANEL (CC13)
ALBUMIN: 3.4 g/dL — AB (ref 3.5–5.0)
ALK PHOS: 76 U/L (ref 40–150)
ALT: 32 U/L (ref 0–55)
AST: 38 U/L — AB (ref 5–34)
Anion Gap: 8 mEq/L (ref 3–11)
BUN: 9.8 mg/dL (ref 7.0–26.0)
CALCIUM: 9.5 mg/dL (ref 8.4–10.4)
CHLORIDE: 103 meq/L (ref 98–109)
CO2: 31 mEq/L — ABNORMAL HIGH (ref 22–29)
Creatinine: 0.7 mg/dL (ref 0.6–1.1)
EGFR: >90 mL/min/{1.73_m2} (ref >90)
Glucose: 104 mg/dl (ref 70–140)
Potassium: 4.2 mEq/L (ref 3.5–5.1)
SODIUM: 142 meq/L (ref 136–145)
TOTAL PROTEIN: 6.5 g/dL (ref 6.4–8.3)
Total Bilirubin: 0.32 mg/dL (ref 0.20–1.20)

## 2015-01-18 MED ORDER — OXYCODONE-ACETAMINOPHEN 5-325 MG PO TABS: 1.0000 | ORAL_TABLET | ORAL | Status: DC | PRN

## 2015-01-18 NOTE — Telephone Encounter (Signed)
per pof to sch pt appt-gave pt copy of sch-adv to call [facility] to sch breat MRI

## 2015-01-18 NOTE — Progress Notes (Signed)
Patient Care Team: Kelton Pillar, MD as PCP - General (Family Medicine) Nicholas Lose, MD as Consulting Physician (Hematology and Oncology) Excell Seltzer, MD as Consulting Physician (General Surgery) Eppie Gibson, MD as Attending Physician (Radiation Oncology) Holley Bouche, NP as Nurse Practitioner (Nurse Practitioner)  DIAGNOSIS: Breast cancer of upper-outer quadrant of right female breast   Staging form: Breast, AJCC 7th Edition     Clinical: Stage IIIA (T3, N1, M0) - Unsigned       Staging comments: Staged at breast conference 11.11.15    SUMMARY OF ONCOLOGIC HISTORY:   Breast cancer of upper-outer quadrant of right female breast   08/05/2014 Initial Diagnosis Invasive ductal carcinoma, 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, grade 3 ER 0%, PR 0%, HER-2 negative, Ki-67 90%: Right axillary lymph node positive for breast cancer   08/12/2014 Breast MRI 4.5x 7.5 x4 cm biopsy-proven right breast upper outer quadrant cancer with biopsy-proven level I right axillary lymph node with 2 level II right axillary lymph nodes identified   09/08/2014 -  Neo-Adjuvant Chemotherapy Dose dense Adriamycin and Cytoxan x4 followed by weekly Taxol and carboplatin x4; Abraxane 8    CHIEF COMPLIANT: We'd 10/12 Abraxane  INTERVAL HISTORY: Jamie Edwards is a 44 year old with above-mentioned history of right-sided breast cancer currently neoadjuvant chemotherapy. She is currently on Abraxane alone. Her absolute neutrophil count is down to 1200. She will need a dose reduction today. She reports no nausea vomiting or diarrhea. Denies any neuropathy.  REVIEW OF SYSTEMS:   Constitutional: Denies fevers, chills or abnormal weight loss Eyes: Denies blurriness of vision Ears, nose, mouth, throat, and face: Denies mucositis or sore throat Respiratory: Denies cough, dyspnea or wheezes Cardiovascular: Denies palpitation, chest discomfort or lower extremity  swelling Gastrointestinal:  Denies nausea, heartburn or change in bowel habits Skin: Denies abnormal skin rashes Lymphatics: Denies new lymphadenopathy or easy bruising Neurological:Denies numbness, tingling or new weaknesses Behavioral/Psych: Mood is stable, no new changes  All other systems were reviewed with the patient and are negative.  I have reviewed the past medical history, past surgical history, social history and family history with the patient and they are unchanged from previous note.  ALLERGIES:  is allergic to penicillins and zithromax.  MEDICATIONS:  Current Outpatient Prescriptions  Medication Sig Dispense Refill  . lidocaine-prilocaine (EMLA) cream Apply to affected area once 30 g 3  . metoprolol tartrate (LOPRESSOR) 25 MG tablet Take 1 tablet (25 mg total) by mouth 2 (two) times daily. 60 tablet 0  . ondansetron (ZOFRAN) 8 MG tablet Take 1 tablet (8 mg total) by mouth 2 (two) times daily as needed. Start on the third day after chemotherapy. 30 tablet 1  . ondansetron (ZOFRAN) 8 MG tablet Take 1 tablet (8 mg total) by mouth 2 (two) times daily. Start the day after chemo for 2 days. Then take as needed for nausea or vomiting. 30 tablet 1  . oxyCODONE-acetaminophen (ROXICET) 5-325 MG per tablet Take 1-2 tablets by mouth every 4 (four) hours as needed. 30 tablet 0  . PRESCRIPTION MEDICATION every 7 (seven) days. Chemotherapy at [facility]) every 7 days.    . prochlorperazine (COMPAZINE) 10 MG tablet TAKE 1 TABLET (10 MG TOTAL) BY MOUTH EVERY 6 (SIX) HOURS AS NEEDED (NAUSEA OR VOMITING). 30 tablet 2  . prochlorperazine (COMPAZINE) 10 MG tablet Take 1 tablet (10 mg total) by mouth every 6 (six) hours as needed (Nausea or vomiting). 30 tablet 1  . rivaroxaban (XARELTO) 20 MG  TABS tablet Take 1 tablet (20 mg total) by mouth daily with supper. 30 tablet 2  . ALPRAZolam (XANAX) 0.25 MG tablet Take 0.25 mg by mouth 2 (two) times daily as needed for anxiety.    Marland Kitchen dexamethasone  (DECADRON) 4 MG tablet TAKE 2 TABS WITH FOOD, ONCE A DAY ON THE DAY AFTER CHEMO, THEN TAKE 2 TABS TWICE DAILY FOR 2 DAYS, (Patient not taking: Reported on 01/18/2015) 30 tablet 2  . HYDROcodone-homatropine (HYCODAN) 5-1.5 MG/5ML syrup Take 5 mLs by mouth every 6 (six) hours as needed for cough. (Patient not taking: Reported on 01/18/2015) 60 mL 0  . LORazepam (ATIVAN) 0.5 MG tablet TAKE 1 TABLET BY MOUTH EVERY 6 HOURS AS NEEDED FOR NAUSEA/VOMITING (Patient not taking: Reported on 01/18/2015) 30 tablet 0  . LORazepam (ATIVAN) 0.5 MG tablet Take 1 tablet (0.5 mg total) by mouth every 6 (six) hours as needed (Nausea or vomiting). (Patient not taking: Reported on 01/18/2015) 30 tablet 0  . Rivaroxaban (XARELTO STARTER PACK) 15 & 20 MG TBPK Take as directed on package: Start with one $Remove'15mg'fMSOeLI$  tablet by mouth twice a day with food. On Day 22, switch to one $Remo'20mg'totMT$  tablet once a day with food. (Patient not taking: Reported on 01/18/2015) 51 each 0   No current facility-administered medications for this visit.    PHYSICAL EXAMINATION: ECOG PERFORMANCE STATUS: 1 - Symptomatic but completely ambulatory  Filed Vitals:   01/18/15 1032  BP: 100/62  Pulse: 78  Temp: 98 F (36.7 C)  Resp: 18   Filed Weights   01/18/15 1032  Weight: 205 lb 6.4 oz (93.169 kg)    GENERAL:alert, no distress and comfortable SKIN: skin color, texture, turgor are normal, no rashes or significant lesions EYES: normal, Conjunctiva are pink and non-injected, sclera clear OROPHARYNX:no exudate, no erythema and lips, buccal mucosa, and tongue normal  NECK: supple, thyroid normal size, non-tender, without nodularity LYMPH:  no palpable lymphadenopathy in the cervical, axillary or inguinal LUNGS: clear to auscultation and percussion with normal breathing effort HEART: regular rate & rhythm and no murmurs and no lower extremity edema ABDOMEN:abdomen soft, non-tender and normal bowel sounds Musculoskeletal:no cyanosis of digits and no  clubbing  NEURO: alert & oriented x 3 with fluent speech, no focal motor/sensory deficits  LABORATORY DATA:  I have reviewed the data as listed   Chemistry      Component Value Date/Time   NA 141 01/12/2015 0955   NA 138 12/02/2014 0525   K 4.0 01/12/2015 0955   K 3.4* 12/03/2014 0510   CL 97 12/02/2014 0525   CO2 30* 01/12/2015 0955   CO2 31 12/02/2014 0525   BUN 9.3 01/12/2015 0955   BUN 16 12/02/2014 0525   CREATININE 0.8 01/12/2015 0955   CREATININE 0.75 12/02/2014 0525      Component Value Date/Time   CALCIUM 9.7 01/12/2015 0955   CALCIUM 9.2 12/02/2014 0525   ALKPHOS 87 01/12/2015 0955   AST 32 01/12/2015 0955   ALT 30 01/12/2015 0955   BILITOT 0.30 01/12/2015 0955       Lab Results  Component Value Date   WBC 2.2* 01/18/2015   HGB 9.6* 01/18/2015   HCT 30.6* 01/18/2015   MCV 93.3 01/18/2015   PLT 254 01/18/2015   NEUTROABS 1.2* 01/18/2015    ASSESSMENT & PLAN:  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 Alopecia 2. Chemotherapy-induced anemia grade 1 3. Sinus pressure: On over-the-counter cold and sinus medications. Resolved 4. Nausea with Taxol carbo chemotherapy: I changed antiemetic regimen from Zofran to Aloxi IV 5. 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  Discontinue Taxol and carboplatin and switched to Abraxane with week 5. On Abraxane the neuropathy is stable. 7. Neutropenia grade 2: Decreased the dosage of Abraxane to 50 mg/m cycle 10 onwards  Pulmonary embolism: Currently on Xarelto.   Treatment plan: Obtain a breast MRI on March 5, present in tumor board March 11. Monitoring closely for toxicities Return to clinic in 2 weeks for cycle 12 of Abraxane.     Orders Placed This Encounter  Procedures  . MR Breast Bilateral Wo Contrast    Standing Status: Future     Number of Occurrences:      Standing Expiration Date: 01/18/2016    Order Specific Question:  Reason for Exam (SYMPTOM  OR DIAGNOSIS REQUIRED)    Answer:  Post neoadj chemo breast cancer    Order Specific Question:  Preferred imaging location?    Answer:  [facility]    Order Specific Question:  Does the patient have a pacemaker or implanted devices?    Answer:  No    Order Specific Question:  What is the patient's sedation requirement?    Answer:  No Sedation  . MR Breast Bilateral W Contrast    Standing Status: Future     Number of Occurrences:      Standing Expiration Date: 01/18/2016    Order Specific Question:  Reason for Exam (SYMPTOM  OR DIAGNOSIS REQUIRED)    Answer:  Post neoadj chemo breast cancer    Order Specific Question:  Preferred imaging location?    Answer:  [facility]    Order Specific Question:  Does the patient have a pacemaker or implanted devices?    Answer:  No    Order Specific Question:  What is the patient's sedation requirement?    Answer:  No Sedation   The patient has a good understanding of the overall plan. she agrees with it. She will call with any problems that may develop before her next visit here.   Rulon Eisenmenger, MD

## 2015-01-18 NOTE — Assessment & Plan Note (Addendum)
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 Alopecia 2. Chemotherapy-induced anemia grade 1 3. Sinus pressure: On over-the-counter cold and sinus medications. Resolved 4. Nausea with Taxol carbo chemotherapy: I changed antiemetic regimen from Zofran to Aloxi IV 5. 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 Discontinue Taxol and carboplatin and switched to Abraxane with week 5. On Abraxane the neuropathy is stable. 7. Neutropenia grade 2: Decreased the dosage of Abraxane to 50 mg/m cycle 10 onwards  Pulmonary embolism: Currently on Xarelto.   Treatment plan: Obtain a breast MRI on March 5, present in tumor board March 11. Monitoring closely for toxicities Return to clinic in 2 weeks for cycle 12 of Abraxane.

## 2015-01-19 ENCOUNTER — Ambulatory Visit (HOSPITAL_BASED_OUTPATIENT_CLINIC_OR_DEPARTMENT_OTHER): Payer: 59

## 2015-01-19 ENCOUNTER — Other Ambulatory Visit: Payer: Self-pay | Admitting: Hematology and Oncology

## 2015-01-19 VITALS — BP 121/65 | HR 88 | Temp 98.2°F | Resp 18

## 2015-01-19 DIAGNOSIS — Z5111 Encounter for antineoplastic chemotherapy: Secondary | ICD-10-CM | POA: Diagnosis not present

## 2015-01-19 DIAGNOSIS — C50411 Malignant neoplasm of upper-outer quadrant of right female breast: Secondary | ICD-10-CM

## 2015-01-19 MED ORDER — SODIUM CHLORIDE 0.9 % IJ SOLN
10.0000 mL | INTRAMUSCULAR | Status: DC | PRN
  Administered 2015-01-19: 10 mL
  Filled 2015-01-19: qty 10

## 2015-01-19 MED ORDER — HEPARIN SOD (PORK) LOCK FLUSH 100 UNIT/ML IV SOLN
500.0000 [IU] | Freq: Once | INTRAVENOUS | Status: AC | PRN
  Administered 2015-01-19: 500 [IU]
  Filled 2015-01-19: qty 5

## 2015-01-19 MED ORDER — SODIUM CHLORIDE 0.9 % IV SOLN
Freq: Once | INTRAVENOUS | Status: AC
  Administered 2015-01-19: 15:00:00 via INTRAVENOUS

## 2015-01-19 MED ORDER — SODIUM CHLORIDE 0.9 % IV SOLN
Freq: Once | INTRAVENOUS | Status: AC
  Administered 2015-01-19: 15:00:00 via INTRAVENOUS
  Filled 2015-01-19: qty 4

## 2015-01-19 MED ORDER — PACLITAXEL PROTEIN-BOUND CHEMO INJECTION 100 MG
50.0000 mg/m2 | Freq: Once | INTRAVENOUS | Status: AC
  Administered 2015-01-19: 100 mg via INTRAVENOUS
  Filled 2015-01-19: qty 20

## 2015-01-19 NOTE — Progress Notes (Signed)
Ok to treat with ANC 1.2 per dr Lindi Adie, dose reduced

## 2015-01-19 NOTE — Patient Instructions (Signed)
[facility] Discharge Instructions for Patients Receiving Chemotherapy  Today you received the following chemotherapy agents abraxane.   To help prevent nausea and vomiting after your treatment, we encourage you to take your nausea medication as directed.   If you develop nausea and vomiting that is not controlled by your nausea medication, call the clinic.   BELOW ARE SYMPTOMS THAT SHOULD BE REPORTED IMMEDIATELY:  *FEVER GREATER THAN 100.5 F  *CHILLS WITH OR WITHOUT FEVER  NAUSEA AND VOMITING THAT IS NOT CONTROLLED WITH YOUR NAUSEA MEDICATION  *UNUSUAL SHORTNESS OF BREATH  *UNUSUAL BRUISING OR BLEEDING  TENDERNESS IN MOUTH AND THROAT WITH OR WITHOUT PRESENCE OF ULCERS  *URINARY PROBLEMS  *BOWEL PROBLEMS  UNUSUAL RASH Items with * indicate a potential emergency and should be followed up as soon as possible.  Feel free to call the clinic you have any questions or concerns. The clinic phone number is [phone].  

## 2015-01-23 ENCOUNTER — Other Ambulatory Visit: Payer: Self-pay | Admitting: Hematology and Oncology

## 2015-01-23 NOTE — Telephone Encounter (Signed)
Lst Ov 01/18/15.  Next OV 02/02/15.  Chart reviewed

## 2015-01-26 ENCOUNTER — Ambulatory Visit (HOSPITAL_BASED_OUTPATIENT_CLINIC_OR_DEPARTMENT_OTHER): Payer: 59

## 2015-01-26 ENCOUNTER — Other Ambulatory Visit (HOSPITAL_BASED_OUTPATIENT_CLINIC_OR_DEPARTMENT_OTHER): Payer: 59

## 2015-01-26 VITALS — BP 100/62 | HR 76 | Temp 98.8°F | Resp 18

## 2015-01-26 DIAGNOSIS — C50411 Malignant neoplasm of upper-outer quadrant of right female breast: Secondary | ICD-10-CM

## 2015-01-26 DIAGNOSIS — Z5111 Encounter for antineoplastic chemotherapy: Secondary | ICD-10-CM

## 2015-01-26 LAB — COMPREHENSIVE METABOLIC PANEL (CC13)
ALT: 23 U/L (ref 0–55)
ANION GAP: 11 meq/L (ref 3–11)
AST: 33 U/L (ref 5–34)
Albumin: 3.3 g/dL — ABNORMAL LOW (ref 3.5–5.0)
Alkaline Phosphatase: 69 U/L (ref 40–150)
BUN: 8 mg/dL (ref 7.0–26.0)
CALCIUM: 9.6 mg/dL (ref 8.4–10.4)
CO2: 27 meq/L (ref 22–29)
CREATININE: 0.8 mg/dL (ref 0.6–1.1)
Chloride: 103 mEq/L (ref 98–109)
EGFR: >90 mL/min/{1.73_m2} (ref >90)
GLUCOSE: 117 mg/dL (ref 70–140)
Potassium: 4.1 mEq/L (ref 3.5–5.1)
Sodium: 141 mEq/L (ref 136–145)
Total Protein: 6.2 g/dL — ABNORMAL LOW (ref 6.4–8.3)

## 2015-01-26 LAB — CBC WITH DIFFERENTIAL/PLATELET
BASO%: 0.8 % (ref 0.0–2.0)
BASOS ABS: 0 10*3/uL (ref 0.0–0.1)
EOS%: 0.9 % (ref 0.0–7.0)
Eosinophils Absolute: 0 10*3/uL (ref 0.0–0.5)
HCT: 31.5 % — ABNORMAL LOW (ref 34.8–46.6)
HEMOGLOBIN: 10 g/dL — AB (ref 11.6–15.9)
LYMPH%: 28.2 % (ref 14.0–49.7)
MCH: 29.4 pg (ref 25.1–34.0)
MCHC: 31.8 g/dL (ref 31.5–36.0)
MCV: 92.4 fL (ref 79.5–101.0)
MONO#: 0.4 10*3/uL (ref 0.1–0.9)
MONO%: 11.9 % (ref 0.0–14.0)
NEUT#: 1.9 10*3/uL (ref 1.5–6.5)
NEUT%: 58.2 % (ref 38.4–76.8)
PLATELETS: 321 10*3/uL (ref 145–400)
RBC: 3.4 10*6/uL — AB (ref 3.70–5.45)
RDW: 18.1 % — ABNORMAL HIGH (ref 11.2–14.5)
WBC: 3.2 10*3/uL — ABNORMAL LOW (ref 3.9–10.3)
lymph#: 0.9 10*3/uL (ref 0.9–3.3)

## 2015-01-26 MED ORDER — SODIUM CHLORIDE 0.9 % IV SOLN
Freq: Once | INTRAVENOUS | Status: AC
  Administered 2015-01-26: 13:00:00 via INTRAVENOUS
  Filled 2015-01-26: qty 4

## 2015-01-26 MED ORDER — PACLITAXEL PROTEIN-BOUND CHEMO INJECTION 100 MG
50.0000 mg/m2 | Freq: Once | INTRAVENOUS | Status: AC
  Administered 2015-01-26: 100 mg via INTRAVENOUS
  Filled 2015-01-26: qty 20

## 2015-01-26 MED ORDER — SODIUM CHLORIDE 0.9 % IJ SOLN
10.0000 mL | INTRAMUSCULAR | Status: DC | PRN
Start: 2015-01-26 — End: 2015-01-26
  Administered 2015-01-26: 10 mL
  Filled 2015-01-26: qty 10

## 2015-01-26 MED ORDER — SODIUM CHLORIDE 0.9 % IV SOLN
Freq: Once | INTRAVENOUS | Status: AC
  Administered 2015-01-26: 13:00:00 via INTRAVENOUS

## 2015-01-26 MED ORDER — HEPARIN SOD (PORK) LOCK FLUSH 100 UNIT/ML IV SOLN
500.0000 [IU] | Freq: Once | INTRAVENOUS | Status: AC | PRN
  Administered 2015-01-26: 500 [IU]
  Filled 2015-01-26: qty 5

## 2015-01-26 NOTE — Patient Instructions (Signed)
[facility] Discharge Instructions for Patients Receiving Chemotherapy  Today you received the following chemotherapy agents: Abraxane   To help prevent nausea and vomiting after your treatment, we encourage you to take your nausea medication as directed.    If you develop nausea and vomiting that is not controlled by your nausea medication, call the clinic.   BELOW ARE SYMPTOMS THAT SHOULD BE REPORTED IMMEDIATELY:  *FEVER GREATER THAN 100.5 F  *CHILLS WITH OR WITHOUT FEVER  NAUSEA AND VOMITING THAT IS NOT CONTROLLED WITH YOUR NAUSEA MEDICATION  *UNUSUAL SHORTNESS OF BREATH  *UNUSUAL BRUISING OR BLEEDING  TENDERNESS IN MOUTH AND THROAT WITH OR WITHOUT PRESENCE OF ULCERS  *URINARY PROBLEMS  *BOWEL PROBLEMS  UNUSUAL RASH Items with * indicate a potential emergency and should be followed up as soon as possible.  Feel free to call the clinic you have any questions or concerns. The clinic phone number is [phone].  Please show the CHEMO ALERT CARD at check-in to the Emergency Department and triage nurse.   

## 2015-02-01 ENCOUNTER — Ambulatory Visit
Admission: RE | Admit: 2015-02-01 | Discharge: 2015-02-01 | Disposition: A | Discharge status: Home / Self Care | Payer: 59 | Source: Ambulatory Visit | Attending: Hematology and Oncology | Admitting: Hematology and Oncology

## 2015-02-01 DIAGNOSIS — C50411 Malignant neoplasm of upper-outer quadrant of right female breast: Secondary | ICD-10-CM

## 2015-02-01 MED ORDER — GADOBENATE DIMEGLUMINE 529 MG/ML IV SOLN
19.0000 mL | Freq: Once | INTRAVENOUS | Status: AC | PRN
  Administered 2015-02-01: 19 mL via INTRAVENOUS

## 2015-02-01 NOTE — Assessment & Plan Note (Signed)
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 Alopecia 2. Chemotherapy-induced anemia grade 1 3. Sinus pressure: On over-the-counter cold and sinus medications. Resolved 4. Nausea with Taxol carbo chemotherapy: I changed antiemetic regimen from Zofran to Aloxi IV 5. 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 Discontinue Taxol and carboplatin and switched to Abraxane with week 5. On Abraxane the neuropathy is stable. 7. Neutropenia grade 2: Decreased the dosage of Abraxane to 50 mg/m cycle 10 onwards  Pulmonary embolism: Currently on Xarelto.   Treatment plan: Obtain a breast MRI on March 5, present in tumor board March 11. Monitoring closely for toxicities

## 2015-02-02 ENCOUNTER — Ambulatory Visit (HOSPITAL_BASED_OUTPATIENT_CLINIC_OR_DEPARTMENT_OTHER): Payer: 59

## 2015-02-02 ENCOUNTER — Ambulatory Visit (HOSPITAL_BASED_OUTPATIENT_CLINIC_OR_DEPARTMENT_OTHER): Payer: 59 | Admitting: Hematology and Oncology

## 2015-02-02 VITALS — BP 97/64 | HR 101 | Temp 98.1°F | Resp 18 | Ht 68.0 in | Wt 209.1 lb

## 2015-02-02 DIAGNOSIS — D701 Agranulocytosis secondary to cancer chemotherapy: Secondary | ICD-10-CM | POA: Diagnosis not present

## 2015-02-02 DIAGNOSIS — C773 Secondary and unspecified malignant neoplasm of axilla and upper limb lymph nodes: Secondary | ICD-10-CM | POA: Diagnosis not present

## 2015-02-02 DIAGNOSIS — Z5111 Encounter for antineoplastic chemotherapy: Secondary | ICD-10-CM

## 2015-02-02 DIAGNOSIS — C50411 Malignant neoplasm of upper-outer quadrant of right female breast: Secondary | ICD-10-CM

## 2015-02-02 DIAGNOSIS — D6481 Anemia due to antineoplastic chemotherapy: Secondary | ICD-10-CM

## 2015-02-02 DIAGNOSIS — Z171 Estrogen receptor negative status [ER-]: Secondary | ICD-10-CM

## 2015-02-02 LAB — CBC WITH DIFFERENTIAL/PLATELET
BASO%: 0.7 % (ref 0.0–2.0)
Basophils Absolute: 0 10*3/uL (ref 0.0–0.1)
EOS ABS: 0.1 10*3/uL (ref 0.0–0.5)
EOS%: 1.3 % (ref 0.0–7.0)
HCT: 32 % — ABNORMAL LOW (ref 34.8–46.6)
HGB: 10.2 g/dL — ABNORMAL LOW (ref 11.6–15.9)
LYMPH#: 1.3 10*3/uL (ref 0.9–3.3)
LYMPH%: 29.5 % (ref 14.0–49.7)
MCH: 29.9 pg (ref 25.1–34.0)
MCHC: 31.9 g/dL (ref 31.5–36.0)
MCV: 93.8 fL (ref 79.5–101.0)
MONO#: 0.4 10*3/uL (ref 0.1–0.9)
MONO%: 9.5 % (ref 0.0–14.0)
NEUT#: 2.7 10*3/uL (ref 1.5–6.5)
NEUT%: 59 % (ref 38.4–76.8)
PLATELETS: 288 10*3/uL (ref 145–400)
RBC: 3.41 10*6/uL — ABNORMAL LOW (ref 3.70–5.45)
RDW: 16.6 % — ABNORMAL HIGH (ref 11.2–14.5)
WBC: 4.6 10*3/uL (ref 3.9–10.3)

## 2015-02-02 LAB — COMPREHENSIVE METABOLIC PANEL (CC13)
ALK PHOS: 71 U/L (ref 40–150)
ALT: 23 U/L (ref 0–55)
ANION GAP: 11 meq/L (ref 3–11)
AST: 33 U/L (ref 5–34)
Albumin: 3.2 g/dL — ABNORMAL LOW (ref 3.5–5.0)
BUN: 12.8 mg/dL (ref 7.0–26.0)
CO2: 27 mEq/L (ref 22–29)
CREATININE: 0.8 mg/dL (ref 0.6–1.1)
Calcium: 9.8 mg/dL (ref 8.4–10.4)
Chloride: 103 mEq/L (ref 98–109)
GLUCOSE: 99 mg/dL (ref 70–140)
Potassium: 4 mEq/L (ref 3.5–5.1)
SODIUM: 141 meq/L (ref 136–145)
TOTAL PROTEIN: 6 g/dL — AB (ref 6.4–8.3)
Total Bilirubin: <0.2 mg/dL (ref 0.20–1.20)

## 2015-02-02 MED ORDER — SODIUM CHLORIDE 0.9 % IV SOLN
Freq: Once | INTRAVENOUS | Status: AC
  Administered 2015-02-02: 09:00:00 via INTRAVENOUS

## 2015-02-02 MED ORDER — PACLITAXEL PROTEIN-BOUND CHEMO INJECTION 100 MG
50.0000 mg/m2 | Freq: Once | INTRAVENOUS | Status: AC
  Administered 2015-02-02: 100 mg via INTRAVENOUS
  Filled 2015-02-02: qty 20

## 2015-02-02 MED ORDER — SODIUM CHLORIDE 0.9 % IJ SOLN
10.0000 mL | INTRAMUSCULAR | Status: DC | PRN
  Administered 2015-02-02: 10 mL
  Filled 2015-02-02: qty 10

## 2015-02-02 MED ORDER — SODIUM CHLORIDE 0.9 % IV SOLN
Freq: Once | INTRAVENOUS | Status: AC
  Administered 2015-02-02: 10:00:00 via INTRAVENOUS
  Filled 2015-02-02: qty 4

## 2015-02-02 MED ORDER — HEPARIN SOD (PORK) LOCK FLUSH 100 UNIT/ML IV SOLN
500.0000 [IU] | Freq: Once | INTRAVENOUS | Status: AC | PRN
  Administered 2015-02-02: 500 [IU]
  Filled 2015-02-02: qty 5

## 2015-02-02 NOTE — Patient Instructions (Signed)
[facility] Discharge Instructions for Patients Receiving Chemotherapy  Today you received the following chemotherapy agents: Abraxane   To help prevent nausea and vomiting after your treatment, we encourage you to take your nausea medication as directed.    If you develop nausea and vomiting that is not controlled by your nausea medication, call the clinic.   BELOW ARE SYMPTOMS THAT SHOULD BE REPORTED IMMEDIATELY:  *FEVER GREATER THAN 100.5 F  *CHILLS WITH OR WITHOUT FEVER  NAUSEA AND VOMITING THAT IS NOT CONTROLLED WITH YOUR NAUSEA MEDICATION  *UNUSUAL SHORTNESS OF BREATH  *UNUSUAL BRUISING OR BLEEDING  TENDERNESS IN MOUTH AND THROAT WITH OR WITHOUT PRESENCE OF ULCERS  *URINARY PROBLEMS  *BOWEL PROBLEMS  UNUSUAL RASH Items with * indicate a potential emergency and should be followed up as soon as possible.  Feel free to call the clinic you have any questions or concerns. The clinic phone number is [phone].  Please show the CHEMO ALERT CARD at check-in to the Emergency Department and triage nurse.   

## 2015-02-02 NOTE — Progress Notes (Signed)
Patient Care Team: Kelton Pillar, MD as PCP - General (Family Medicine) Nicholas Lose, MD as Consulting Physician (Hematology and Oncology) Excell Seltzer, MD as Consulting Physician (General Surgery) Eppie Gibson, MD as Attending Physician (Radiation Oncology) Holley Bouche, NP as Nurse Practitioner (Nurse Practitioner)  DIAGNOSIS: Breast cancer of upper-outer quadrant of right female breast   Staging form: Breast, AJCC 7th Edition     Clinical: Stage IIIA (T3, N1, M0) - Unsigned       Staging comments: Staged at breast conference 11.11.15    SUMMARY OF ONCOLOGIC HISTORY:   Breast cancer of upper-outer quadrant of right female breast   08/05/2014 Initial Diagnosis Invasive ductal carcinoma, 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, grade 3 ER 0%, PR 0%, HER-2 negative, Ki-67 90%: Right axillary lymph node positive for breast cancer   08/12/2014 Breast MRI 4.5x 7.5 x4 cm biopsy-proven right breast upper outer quadrant cancer with biopsy-proven level I right axillary lymph node with 2 level II right axillary lymph nodes identified   09/08/2014 - 02/02/2015 Neo-Adjuvant Chemotherapy Dose dense Adriamycin and Cytoxan x4 followed by weekly Taxol and carboplatin x4; Abraxane 8    CHIEF COMPLIANT: cycle 12/12 Abraxane  INTERVAL HISTORY: Jamie Edwards is a 44 year old with above-mentioned history of right breast cancer and today's her last cycle of chemotherapy. She has done fairly well except for grade 1 neuropathy of the tips of the fingers and toes. Denies any nausea vomiting or any other problems.  REVIEW OF SYSTEMS:   Constitutional: Denies fevers, chills or abnormal weight loss Eyes: Denies blurriness of vision Ears, nose, mouth, throat, and face: Denies mucositis or sore throat Respiratory: Denies cough, dyspnea or wheezes Cardiovascular: Denies palpitation, chest discomfort or lower extremity swelling Gastrointestinal:  Denies nausea, heartburn or  change in bowel habits Skin: Denies abnormal skin rashes Lymphatics: Denies new lymphadenopathy or easy bruising Neurological:numbness and tingling tips of the fingers and toes Behavioral/Psych: Mood is stable, no new changes  Breast:  denies any pain or lumps or nodules in either breasts All other systems were reviewed with the patient and are negative.  I have reviewed the past medical history, past surgical history, social history and family history with the patient and they are unchanged from previous note.  ALLERGIES:  is allergic to penicillins and zithromax.  MEDICATIONS:  Current Outpatient Prescriptions  Medication Sig Dispense Refill  . ALPRAZolam (XANAX) 0.25 MG tablet Take 0.25 mg by mouth 2 (two) times daily as needed for anxiety.    Marland Kitchen dexamethasone (DECADRON) 4 MG tablet TAKE 2 TABS WITH FOOD, ONCE A DAY ON THE DAY AFTER CHEMO, THEN TAKE 2 TABS TWICE DAILY FOR 2 DAYS, 30 tablet 2  . lidocaine-prilocaine (EMLA) cream Apply to affected area once 30 g 3  . metoprolol tartrate (LOPRESSOR) 25 MG tablet TAKE 1 TABLET (25 MG TOTAL) BY MOUTH 2 (TWO) TIMES DAILY. **INS TO PAY 3/21** 60 tablet 0  . ondansetron (ZOFRAN) 8 MG tablet Take 1 tablet (8 mg total) by mouth 2 (two) times daily. Start the day after chemo for 2 days. Then take as needed for nausea or vomiting. 30 tablet 1  . oxyCODONE-acetaminophen (ROXICET) 5-325 MG per tablet Take 1-2 tablets by mouth every 4 (four) hours as needed. 30 tablet 0  . PRESCRIPTION MEDICATION every 7 (seven) days. Chemotherapy at [facility]) every 7 days.    . prochlorperazine (COMPAZINE) 10 MG tablet Take 1 tablet (10 mg total) by mouth every 6 (six) hours  as needed (Nausea or vomiting). 30 tablet 1  . rivaroxaban (XARELTO) 20 MG TABS tablet Take 1 tablet (20 mg total) by mouth daily with supper. 30 tablet 2  . LORazepam (ATIVAN) 0.5 MG tablet TAKE 1 TABLET BY MOUTH EVERY 6 HOURS AS NEEDED FOR NAUSEA/VOMITING (Patient not taking: Reported on  01/18/2015) 30 tablet 0  . LORazepam (ATIVAN) 0.5 MG tablet Take 1 tablet (0.5 mg total) by mouth every 6 (six) hours as needed (Nausea or vomiting). (Patient not taking: Reported on 01/18/2015) 30 tablet 0   No current facility-administered medications for this visit.    PHYSICAL EXAMINATION: ECOG PERFORMANCE STATUS: 1 - Symptomatic but completely ambulatory  Filed Vitals:   02/02/15 0825  BP: 97/64  Pulse: 101  Temp: 98.1 F (36.7 C)  Resp: 18   Filed Weights   02/02/15 0825  Weight: 209 lb 1.6 oz (94.847 kg)    GENERAL:alert, no distress and comfortable SKIN: skin color, texture, turgor are normal, no rashes or significant lesions EYES: normal, Conjunctiva are pink and non-injected, sclera clear OROPHARYNX:no exudate, no erythema and lips, buccal mucosa, and tongue normal  NECK: supple, thyroid normal size, non-tender, without nodularity LYMPH:  no palpable lymphadenopathy in the cervical, axillary or inguinal LUNGS: clear to auscultation and percussion with normal breathing effort HEART: regular rate & rhythm and no murmurs and no lower extremity edema ABDOMEN:abdomen soft, non-tender and normal bowel sounds Musculoskeletal:no cyanosis of digits and no clubbing  NEURO: alert & oriented x 3 with fluent speech, grade 1 peripheral neuropathy  LABORATORY DATA:  I have reviewed the data as listed   Chemistry      Component Value Date/Time   NA 141 01/26/2015 1135   NA 138 12/02/2014 0525   K 4.1 01/26/2015 1135   K 3.4* 12/03/2014 0510   CL 97 12/02/2014 0525   CO2 27 01/26/2015 1135   CO2 31 12/02/2014 0525   BUN 8.0 01/26/2015 1135   BUN 16 12/02/2014 0525   CREATININE 0.8 01/26/2015 1135   CREATININE 0.75 12/02/2014 0525      Component Value Date/Time   CALCIUM 9.6 01/26/2015 1135   CALCIUM 9.2 12/02/2014 0525   ALKPHOS 69 01/26/2015 1135   AST 33 01/26/2015 1135   ALT 23 01/26/2015 1135   BILITOT <0.20 01/26/2015 1135       Lab Results  Component Value  Date   WBC 3.2* 01/26/2015   HGB 10.0* 01/26/2015   HCT 31.5* 01/26/2015   MCV 92.4 01/26/2015   PLT 321 01/26/2015   NEUTROABS 1.9 01/26/2015     RADIOGRAPHIC STUDIES: I have personally reviewed the radiology reports and agreed with their findings. Mr Breast Bilateral W Wo Contrast  02/01/2015   CLINICAL DATA:  Metastatic invasive mammary carcinoma right breast upper outer quadrant with metastasis to right axillary lymph nodes for followup after neoadjuvant therapy  LABS:  n/a  EXAM: BILATERAL BREAST MRI WITH AND WITHOUT CONTRAST  TECHNIQUE: Multiplanar, multisequence MR images of both breasts were obtained prior to and following the intravenous administration of 19 ml of MultiHance.  THREE-DIMENSIONAL MR IMAGE RENDERING ON INDEPENDENT WORKSTATION:  Three-dimensional MR images were rendered by post-processing of the original MR data on an independent workstation. The three-dimensional MR images were interpreted, and findings are reported in the following complete MRI report for this study. Three dimensional images were evaluated at the independent DynaCad workstation  COMPARISON:  Multiple prior imaging studies including breast MRI 08/12/2014  FINDINGS: Breast composition: c. Heterogeneous fibroglandular tissue.  Background parenchymal enhancement: Moderate.  Right breast: Marker clip in the right upper outer quadrant posteriorly again identified. This again is noted to be located toward the posterior aspect of non masslike enhancement involving the upper-outer quadrant. Enhancement extends approximately 3 cm posterior to the clip back toward the chest wall as it did previously and about 6 cm anterolateral to the clip, as it did previously. The total area of enhancement measures about 9 x 3 by 3 cm. There is a combination of both homogeneous and clumped segmental non masslike enhancement now identified, with significantly less overall enhancement when compared to the prior MRI. However, there is also a  new 8 mm enhancing mass in the subareolar 8 o'clock position that was not present previously, demonstrating rapid enhancement with washout kinetics. This is seen well on slice image 86 on series performed 5 minutes after contrast enhancement. On the same series, there are 2 adjacent similarly enhancing masses at the middle third depth in the 9 o'clock position, seen best on image number 92, measuring 6 and 7 mm respectively. These were not present previously.  Left breast: No mass or abnormal enhancement.  Lymph nodes: No abnormal appearing lymph nodes. Previously enlarged and enhancing right axillary lymph nodes appear to have resolved.  Ancillary findings: Small right pleural effusion. Right Port-A-Cath noted.  IMPRESSION: Right axillary adenopathy has resolved, with only normal appearing lymph nodes of normal size remaining in the right axilla.  The invasive mammary carcinoma in the right upper outer quadrant has significantly decreased in size and in degree of enhancement. Enhancement continues to extend back to the chest wall with no findings specific for invasion. There remain areas of non masslike enhancement consistent with residual malignancy.  There are also 3 small new masses more anteriorly in the right breast, 1 in the subareolar 8 o'clock position and the other 2 adjacent to 1 another at the middle third depth in the 9 o'clock position. While the development of new areas of malignancy during neoadjuvant chemotherapy would be unlikely, if definitive surgical therapy is contemplated prior to obtaining another MRI scan, histologic diagnosis of these three areas would be suggested, as they are located well anterior to the previously biopsied mass.  RECOMMENDATION: Treatment planning for known malignancy  BI-RADS CATEGORY  6: Known biopsy-proven malignancy.   Electronically Signed   By: Skipper Cliche M.D.   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 Alopecia 2. Chemotherapy-induced anemia grade 1 3. Sinus pressure: On over-the-counter cold and sinus medications. Resolved 4. Nausea with Taxol carbo chemotherapy: I changed antiemetic regimen from Zofran to Aloxi IV 5. 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 Discontinued Taxol and carboplatin and switched to Abraxane with week 5. On Abraxane the neuropathy is stable a grade 1. 7. Neutropenia grade 2: Decreased the dosage of Abraxane to  50 mg/m cycle 10 onwards  Pulmonary embolism: Currently on Xarelto.   Breast MRI: Marked improvement in the breast tumor and resolution of the right axillary lymph node, there are 3 new spots in the breast one beneath the area of unclear significance. I reviewed the breast MRI and showed her the pictures before and after.  I sent a message to Dr. Excell Seltzer see her back regarding surgical options. If she wishes to pursue a lumpectomy and it is technically feasible, then she might need to have those biopsied. Given that she has PALB2 mutation, the new NCCN update suggests that these patients may be considered for bilateral mastectomies. Patient will think about these options when she sees Dr. Excell Seltzer she might have a decision.  Treatment plan: Present in tumor board And  follow with Dr. Excell Seltzer Monitoring closely for toxicities  No orders of the defined types were placed in this encounter.   The patient has a good understanding of the overall plan. she agrees with it. She will call with any problems that may develop before her next visit here.   Rulon Eisenmenger, MD

## 2015-02-17 ENCOUNTER — Other Ambulatory Visit: Payer: Self-pay | Admitting: Hematology and Oncology

## 2015-02-17 DIAGNOSIS — C50411 Malignant neoplasm of upper-outer quadrant of right female breast: Secondary | ICD-10-CM

## 2015-02-19 ENCOUNTER — Other Ambulatory Visit: Payer: Self-pay | Admitting: Hematology and Oncology

## 2015-02-20 ENCOUNTER — Other Ambulatory Visit: Payer: Self-pay | Admitting: Hematology and Oncology

## 2015-02-21 ENCOUNTER — Other Ambulatory Visit: Payer: Self-pay | Admitting: *Deleted

## 2015-02-21 ENCOUNTER — Telehealth: Payer: Self-pay | Admitting: *Deleted

## 2015-02-21 DIAGNOSIS — C50411 Malignant neoplasm of upper-outer quadrant of right female breast: Secondary | ICD-10-CM

## 2015-02-21 NOTE — Telephone Encounter (Signed)
Pt called with some periorbital edema and lower extremity edema. Scheduled and confirmed pt to be seen by Dr. Lindi Adie on 02/22/15 at 8:15

## 2015-02-21 NOTE — Assessment & Plan Note (Signed)
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 Residual NME suggestive of residual cancer  Pulmonary embolism: Currently on Xarelto.   Periorbital edema:   Plan: Surgery  RTC after surgery

## 2015-02-22 ENCOUNTER — Other Ambulatory Visit (HOSPITAL_BASED_OUTPATIENT_CLINIC_OR_DEPARTMENT_OTHER): Payer: 59

## 2015-02-22 ENCOUNTER — Ambulatory Visit (HOSPITAL_BASED_OUTPATIENT_CLINIC_OR_DEPARTMENT_OTHER): Payer: 59 | Admitting: Hematology and Oncology

## 2015-02-22 VITALS — BP 113/64 | HR 100 | Temp 98.0°F | Resp 18 | Ht 68.0 in | Wt 210.4 lb

## 2015-02-22 DIAGNOSIS — D6481 Anemia due to antineoplastic chemotherapy: Secondary | ICD-10-CM

## 2015-02-22 DIAGNOSIS — R600 Localized edema: Secondary | ICD-10-CM

## 2015-02-22 DIAGNOSIS — C50411 Malignant neoplasm of upper-outer quadrant of right female breast: Secondary | ICD-10-CM

## 2015-02-22 DIAGNOSIS — I2699 Other pulmonary embolism without acute cor pulmonale: Secondary | ICD-10-CM

## 2015-02-22 DIAGNOSIS — C773 Secondary and unspecified malignant neoplasm of axilla and upper limb lymph nodes: Secondary | ICD-10-CM | POA: Diagnosis not present

## 2015-02-22 DIAGNOSIS — D701 Agranulocytosis secondary to cancer chemotherapy: Secondary | ICD-10-CM | POA: Diagnosis not present

## 2015-02-22 LAB — CBC WITH DIFFERENTIAL/PLATELET
BASO%: 0.8 % (ref 0.0–2.0)
BASOS ABS: 0 10*3/uL (ref 0.0–0.1)
EOS%: 1.4 % (ref 0.0–7.0)
Eosinophils Absolute: 0.1 10*3/uL (ref 0.0–0.5)
HEMATOCRIT: 34.2 % — AB (ref 34.8–46.6)
HEMOGLOBIN: 11 g/dL — AB (ref 11.6–15.9)
LYMPH#: 1.2 10*3/uL (ref 0.9–3.3)
LYMPH%: 21.6 % (ref 14.0–49.7)
MCH: 29.1 pg (ref 25.1–34.0)
MCHC: 32.2 g/dL (ref 31.5–36.0)
MCV: 90.3 fL (ref 79.5–101.0)
MONO#: 0.5 10*3/uL (ref 0.1–0.9)
MONO%: 9.6 % (ref 0.0–14.0)
NEUT#: 3.7 10*3/uL (ref 1.5–6.5)
NEUT%: 66.6 % (ref 38.4–76.8)
PLATELETS: 253 10*3/uL (ref 145–400)
RBC: 3.79 10*6/uL (ref 3.70–5.45)
RDW: 16.7 % — ABNORMAL HIGH (ref 11.2–14.5)
WBC: 5.6 10*3/uL (ref 3.9–10.3)

## 2015-02-22 LAB — COMPREHENSIVE METABOLIC PANEL (CC13)
ALK PHOS: 82 U/L (ref 40–150)
ALT: 39 U/L (ref 0–55)
AST: 45 U/L — ABNORMAL HIGH (ref 5–34)
Albumin: 3.3 g/dL — ABNORMAL LOW (ref 3.5–5.0)
Anion Gap: 11 mEq/L (ref 3–11)
BILIRUBIN TOTAL: 0.32 mg/dL (ref 0.20–1.20)
BUN: 13.1 mg/dL (ref 7.0–26.0)
CO2: 29 mEq/L (ref 22–29)
Calcium: 10.1 mg/dL (ref 8.4–10.4)
Chloride: 102 mEq/L (ref 98–109)
Creatinine: 0.8 mg/dL (ref 0.6–1.1)
EGFR: >90 mL/min/{1.73_m2} (ref >90)
Glucose: 121 mg/dl (ref 70–140)
Potassium: 3.8 mEq/L (ref 3.5–5.1)
Sodium: 142 mEq/L (ref 136–145)
TOTAL PROTEIN: 6.3 g/dL — AB (ref 6.4–8.3)

## 2015-02-22 LAB — TSH CHCC: TSH: 1.472 m(IU)/L (ref 0.308–3.960)

## 2015-02-22 LAB — T4: T4 TOTAL: 10 ug/dL (ref 4.5–12.0)

## 2015-02-22 LAB — T3, FREE: T3, Free: 3.9 pg/mL (ref 2.3–4.2)

## 2015-02-22 MED ORDER — FUROSEMIDE 20 MG PO TABS: 20.0000 mg | ORAL_TABLET | Freq: Two times a day (BID) | ORAL | Status: DC

## 2015-02-22 NOTE — Progress Notes (Signed)
Patient Care Team: Kelton Pillar, MD as PCP - General (Family Medicine) Nicholas Lose, MD as Consulting Physician (Hematology and Oncology) Excell Seltzer, MD as Consulting Physician (General Surgery) Eppie Gibson, MD as Attending Physician (Radiation Oncology) Holley Bouche, NP as Nurse Practitioner (Nurse Practitioner)  DIAGNOSIS: Breast cancer of upper-outer quadrant of right female breast   Staging form: Breast, AJCC 7th Edition     Clinical: Stage IIIA (T3, N1, M0) - Unsigned       Staging comments: Staged at breast conference 11.11.15    SUMMARY OF ONCOLOGIC HISTORY:   Breast cancer of upper-outer quadrant of right female breast   08/05/2014 Initial Diagnosis Invasive ductal carcinoma, 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, grade 3 ER 0%, PR 0%, HER-2 negative, Ki-67 90%: Right axillary lymph node positive for breast cancer   08/12/2014 Breast MRI 4.5x 7.5 x4 cm biopsy-proven right breast upper outer quadrant cancer with biopsy-proven level I right axillary lymph node with 2 level II right axillary lymph nodes identified   09/08/2014 - 02/02/2015 Neo-Adjuvant Chemotherapy Dose dense Adriamycin and Cytoxan x4 followed by weekly Taxol and carboplatin x4; Abraxane 8   02/01/2015 Breast MRI Marked improvement in the breast tumor and resolution of the right axillary lymph node, there are 3 new spots in the breast one beneath the area of unclear significance    CHIEF COMPLIANT: Peri orbital and leg edema  INTERVAL HISTORY: Jamie Edwards is a 44 year old with above-mentioned history right breast cancer treated with neoadjuvant chemotherapy and is awaiting surgery. She comes in today for an urgent visit because she started developing periarticular leg edema since a week ago. She does not have any shortness of breath exertion. Denies palpitations. Denies any fevers or chills. Her leg edema gets worse towards the end of the day. She had seen Dr. Bobetta Lime with  plastic surgery and is planning to undergo bilateral mastectomies with immediate reconstruction with tissue expanders.  REVIEW OF SYSTEMS:   Constitutional: Denies fevers, chills or abnormal weight loss Eyes: Denies blurriness of vision, periorbital edema Ears, nose, mouth, throat, and face: Denies mucositis or sore throat Respiratory: Denies cough, dyspnea or wheezes Cardiovascular: Denies palpitation, chest discomfort or lower extremity swelling Gastrointestinal:  Denies nausea, heartburn or change in bowel habits Skin: Denies abnormal skin rashes Lymphatics: Denies new lymphadenopathy or easy bruising Neurological:Denies numbness, tingling or new weaknesses Behavioral/Psych: Mood is stable, no new changes  Extremities: Leg edema  All other systems were reviewed with the patient and are negative.  I have reviewed the past medical history, past surgical history, social history and family history with the patient and they are unchanged from previous note.  ALLERGIES:  is allergic to penicillins and zithromax.  MEDICATIONS:  Current Outpatient Prescriptions  Medication Sig Dispense Refill  . ALPRAZolam (XANAX) 0.25 MG tablet Take 0.25 mg by mouth 2 (two) times daily as needed for anxiety.    Marland Kitchen dexamethasone (DECADRON) 4 MG tablet TAKE 2 TABS WITH FOOD, ONCE A DAY ON THE DAY AFTER CHEMO, THEN TAKE 2 TABS TWICE DAILY FOR 2 DAYS, 30 tablet 2  . lidocaine-prilocaine (EMLA) cream Apply to affected area once 30 g 3  . LORazepam (ATIVAN) 0.5 MG tablet TAKE 1 TABLET BY MOUTH EVERY 6 HOURS AS NEEDED FOR NAUSEA/VOMITING 30 tablet 0  . LORazepam (ATIVAN) 0.5 MG tablet Take 1 tablet (0.5 mg total) by mouth every 6 (six) hours as needed (Nausea or vomiting). 30 tablet 0  . metoprolol tartrate (LOPRESSOR) 25 MG  tablet TAKE 1 TABLET (25 MG TOTAL) BY MOUTH 2 (TWO) TIMES DAILY. **INS TO PAY 3/21** 60 tablet 0  . ondansetron (ZOFRAN) 8 MG tablet Take 1 tablet (8 mg total) by mouth 2 (two) times daily.  Start the day after chemo for 2 days. Then take as needed for nausea or vomiting. 30 tablet 1  . oxyCODONE-acetaminophen (ROXICET) 5-325 MG per tablet Take 1-2 tablets by mouth every 4 (four) hours as needed. 30 tablet 0  . PRESCRIPTION MEDICATION every 7 (seven) days. Chemotherapy at [facility]) every 7 days.    . prochlorperazine (COMPAZINE) 10 MG tablet Take 1 tablet (10 mg total) by mouth every 6 (six) hours as needed (Nausea or vomiting). 30 tablet 1  . rivaroxaban (XARELTO) 20 MG TABS tablet Take 1 tablet (20 mg total) by mouth daily with supper. 30 tablet 2   No current facility-administered medications for this visit.    PHYSICAL EXAMINATION: ECOG PERFORMANCE STATUS: 1 - Symptomatic but completely ambulatory  Filed Vitals:   02/22/15 0810  BP: 113/64  Pulse: 100  Temp: 98 F (36.7 C)  Resp: 18   Filed Weights   02/22/15 0810  Weight: 210 lb 7 oz (95.454 kg)    GENERAL:alert, no distress and comfortable SKIN: skin color, texture, turgor are normal, no rashes or significant lesions EYES: normal, Conjunctiva are pink and non-injected, sclera clear, periorbital edema OROPHARYNX:no exudate, no erythema and lips, buccal mucosa, and tongue normal  NECK: supple, thyroid normal size, non-tender, without nodularity LYMPH:  no palpable lymphadenopathy in the cervical, axillary or inguinal LUNGS: clear to auscultation and percussion with normal breathing effort HEART: regular rate & rhythm and no murmurs and no lower extremity edema ABDOMEN:abdomen soft, non-tender and normal bowel sounds Musculoskeletal:no cyanosis of digits and no clubbing  NEURO: alert & oriented x 3 with fluent speech, no focal motor/sensory deficits extremities: 1+ leg edema   LABORATORY DATA:  I have reviewed the data as listed   Chemistry      Component Value Date/Time   NA 141 02/02/2015 0754   NA 138 12/02/2014 0525   K 4.0 02/02/2015 0754   K 3.4* 12/03/2014 0510   CL 97 12/02/2014 0525    CO2 27 02/02/2015 0754   CO2 31 12/02/2014 0525   BUN 12.8 02/02/2015 0754   BUN 16 12/02/2014 0525   CREATININE 0.8 02/02/2015 0754   CREATININE 0.75 12/02/2014 0525      Component Value Date/Time   CALCIUM 9.8 02/02/2015 0754   CALCIUM 9.2 12/02/2014 0525   ALKPHOS 71 02/02/2015 0754   AST 33 02/02/2015 0754   ALT 23 02/02/2015 0754   BILITOT <0.20 02/02/2015 0754       Lab Results  Component Value Date   WBC 5.6 02/22/2015   HGB 11.0* 02/22/2015   HCT 34.2* 02/22/2015   MCV 90.3 02/22/2015   PLT 253 02/22/2015   NEUTROABS 3.7 02/22/2015    ASSESSMENT & PLAN:  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, T3, N1, M0 stage IIIa clinical stage ER/PR HER-2 negative (triple negative): Biopsy right axillary lymph node also positive for cancer: 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 Residual NME suggestive of residual cancer  Pulmonary embolism: Currently on Xarelto.   Periorbital edema: Awaiting CMP. If her renal function and liver function are normal, I would like to then check for thyroid function tests. I would like to prescribe her Lasix to help with her periorbital and leg edema.  Plan: Surgery with bilateral mastectomies with immediate reconstruction is being planned.  RTC after surgery   Orders Placed This Encounter  Procedures  . TSH-Thyroid Stimulating Hormone    Standing  Status: Future     Number of Occurrences:      Standing Expiration Date: 02/22/2016  . T4    Standing Status: Future     Number of Occurrences:      Standing Expiration Date: 02/22/2016  . T3, free    Standing Status: Future     Number of Occurrences:      Standing Expiration Date: 02/22/2016   The patient has a good understanding of the overall plan. she agrees with it. she will call with any problems that may develop before the next visit here.   Rulon Eisenmenger, MD

## 2015-03-01 ENCOUNTER — Other Ambulatory Visit: Payer: Self-pay | Admitting: General Surgery

## 2015-03-01 DIAGNOSIS — C50911 Malignant neoplasm of unspecified site of right female breast: Secondary | ICD-10-CM

## 2015-03-02 ENCOUNTER — Encounter: Payer: Self-pay | Admitting: *Deleted

## 2015-03-02 DIAGNOSIS — C50411 Malignant neoplasm of upper-outer quadrant of right female breast: Secondary | ICD-10-CM

## 2015-03-02 NOTE — Progress Notes (Signed)
Received referral from [facility] for [place].  Patient is not eligible for the study due to the presence of 3 small new masses in the right breast after neoadjuvant chemotherapy. If the masses were biopsied and negative, then the patient could possibly be eligible.  Called Dawn to inform her of the patient's status and she said she would inform Dr. Excell Seltzer.

## 2015-03-13 ENCOUNTER — Other Ambulatory Visit: Payer: Self-pay | Admitting: *Deleted

## 2015-03-13 DIAGNOSIS — C50411 Malignant neoplasm of upper-outer quadrant of right female breast: Secondary | ICD-10-CM

## 2015-03-13 MED ORDER — OXYCODONE-ACETAMINOPHEN 5-325 MG PO TABS: 1.0000 | ORAL_TABLET | ORAL | Status: DC | PRN

## 2015-03-20 ENCOUNTER — Telehealth: Payer: Self-pay | Admitting: Hematology and Oncology

## 2015-03-20 ENCOUNTER — Other Ambulatory Visit: Payer: Self-pay | Admitting: Hematology and Oncology

## 2015-03-20 ENCOUNTER — Other Ambulatory Visit: Payer: Self-pay

## 2015-03-20 NOTE — Telephone Encounter (Signed)
Spoke with patient and she is aware of her follow up °

## 2015-03-20 NOTE — Telephone Encounter (Signed)
Last OV 6/6.  Surgery 7/6 - pof sent.  Chart reviewed.

## 2015-03-21 ENCOUNTER — Other Ambulatory Visit: Payer: Self-pay | Admitting: Hematology and Oncology

## 2015-03-21 DIAGNOSIS — C50411 Malignant neoplasm of upper-outer quadrant of right female breast: Secondary | ICD-10-CM

## 2015-03-31 ENCOUNTER — Other Ambulatory Visit (HOSPITAL_COMMUNITY): Payer: Self-pay | Admitting: *Deleted

## 2015-03-31 NOTE — Pre-Procedure Instructions (Addendum)
    Jamie Edwards  03/31/2015     Your procedure is scheduled on Wednesday, April 12, 2015 at 8:30 AM.   Report to [facility] Entrance "A" Admitting Office at 6:30 AM.   Call this number if you have problems the morning of surgery: [phone]   Any questions prior to day of surgery, please call [phone] between 8 & 4 PM.    Remember:  Do not eat food or drink liquids after midnight Tuesday, 04/11/15.  Take these medicines the morning of surgery with A SIP OF WATER: Metoprolol (Lopressor), Alprazolam (Xanax) - if needed, Oxycodone - if needed.  Stop Xarelto as instructed by surgeon/physician   Do not wear jewelry, make-up or nail polish.  Do not wear lotions, powders, or perfumes.  You may wear deodorant.  Do not shave 48 hours prior to surgery.    Do not bring valuables to the hospital.  [facility] is not responsible for any belongings or valuables.  Contacts, dentures or bridgework may not be worn into surgery.  Leave your suitcase in the car.  After surgery it may be brought to your room.  For patients admitted to the hospital, discharge time will be determined by your treatment team.  Patients discharged the day of surgery will not be allowed to drive home.   Special instructions:  See "Preparing for Surgery" Instruction sheet.   Please read over the following fact sheets that you were given. Pain Booklet, Coughing and Deep Breathing and Surgical Site Infection Prevention

## 2015-04-03 ENCOUNTER — Encounter (HOSPITAL_COMMUNITY): Payer: Self-pay

## 2015-04-03 ENCOUNTER — Encounter (HOSPITAL_COMMUNITY)
Admission: RE | Admit: 2015-04-03 | Discharge: 2015-04-03 | Disposition: A | Discharge status: Home / Self Care | Payer: 59 | Source: Ambulatory Visit | Attending: General Surgery | Admitting: General Surgery

## 2015-04-03 DIAGNOSIS — Z79899 Other long term (current) drug therapy: Secondary | ICD-10-CM | POA: Diagnosis not present

## 2015-04-03 DIAGNOSIS — Z01812 Encounter for preprocedural laboratory examination: Secondary | ICD-10-CM | POA: Insufficient documentation

## 2015-04-03 DIAGNOSIS — C50911 Malignant neoplasm of unspecified site of right female breast: Secondary | ICD-10-CM | POA: Insufficient documentation

## 2015-04-03 DIAGNOSIS — Z01818 Encounter for other preprocedural examination: Secondary | ICD-10-CM | POA: Diagnosis not present

## 2015-04-03 DIAGNOSIS — Z86711 Personal history of pulmonary embolism: Secondary | ICD-10-CM | POA: Diagnosis not present

## 2015-04-03 DIAGNOSIS — Z7902 Long term (current) use of antithrombotics/antiplatelets: Secondary | ICD-10-CM | POA: Insufficient documentation

## 2015-04-03 DIAGNOSIS — Z86718 Personal history of other venous thrombosis and embolism: Secondary | ICD-10-CM | POA: Insufficient documentation

## 2015-04-03 HISTORY — DX: Acute embolism and thrombosis of unspecified deep veins of unspecified lower extremity: I82.409

## 2015-04-03 HISTORY — DX: Other pulmonary embolism without acute cor pulmonale: I26.99

## 2015-04-03 HISTORY — DX: Cardiac murmur, unspecified: R01.1

## 2015-04-03 HISTORY — DX: Tachycardia, unspecified: R00.0

## 2015-04-03 LAB — BASIC METABOLIC PANEL
Anion gap: 8 (ref 5–15)
BUN: 9 mg/dL (ref 6–20)
CALCIUM: 10 mg/dL (ref 8.9–10.3)
CO2: 29 mmol/L (ref 22–32)
Chloride: 102 mmol/L (ref 101–111)
Creatinine, Ser: 0.75 mg/dL (ref 0.44–1.00)
GFR calc Af Amer: >60 mL/min (ref >60)
GFR calc non Af Amer: >60 mL/min (ref >60)
Glucose, Bld: 114 mg/dL — ABNORMAL HIGH (ref 65–99)
Potassium: 3.9 mmol/L (ref 3.5–5.1)
Sodium: 139 mmol/L (ref 135–145)

## 2015-04-03 LAB — CBC
HCT: 36.5 % (ref 36.0–46.0)
Hemoglobin: 11.8 g/dL — ABNORMAL LOW (ref 12.0–15.0)
MCH: 28 pg (ref 26.0–34.0)
MCHC: 32.3 g/dL (ref 30.0–36.0)
MCV: 86.5 fL (ref 78.0–100.0)
PLATELETS: 262 10*3/uL (ref 150–400)
RBC: 4.22 MIL/uL (ref 3.87–5.11)
RDW: 14.7 % (ref 11.5–15.5)
WBC: 4.3 10*3/uL (ref 4.0–10.5)

## 2015-04-03 NOTE — Progress Notes (Addendum)
Pt. Stated she was told 20 yrs. Ago she had a slight heart murmur. No test were ever done. PCP: Dr. Kelton Pillar has never mention/dectected it per pt.   Pt. Stated she started taking metoprolol in feb. 2016 for increase pulse and blood clots were in her lungs due to taking chemotherapy and she started taking xarelto.  Pt. Has not been instructed on when to stop xarelto. Called dr. Lear Ng office, spoke to [place] and she stated she will get in touch with Dr. Excell Seltzer and pt's oncologist. Stated she will call pt. At home and instruct her  when to stop the medication.

## 2015-04-03 NOTE — Progress Notes (Signed)
Anesthesia Chart Review:  Pt is 44 year old female scheduled for bilateral total mastectomy with R axillary sentinel lymph node biopsy, possible axillary dissection on 04/12/2015 with Dr. Excell Seltzer.   PMH includes: PE (11/2014), DVT (11/2014), breast cancer, heart murmur (20 years ago), increased heart rate. Never smoker. BMI 32. S/p Port-a-cath insertion 08/31/14.   Medications include: metoprolol, xarelto. Christy in Dr. Lear Ng office to discuss when to stop xarelto with Dr. Excell Seltzer and pt's oncologist and will call pt at home to notify her of instructions.   Preoperative labs reviewed.    CT angio chest 11/29/2014: -Pulmonary embolus in the superior segment right lower lobe pulmonary artery. No right heart strain. -No lung edema or consolidation. There is a 5 mm nodular opacity in the lateral segment right middle lobe which must be concerning for small metastatic focus given known breast carcinoma. This nodular opacity was not present on recent prior study. -No appreciable adenopathy.  EKG 11/29/2014 (in the setting of acute PE): Sinus tachycardia (140 bpm). Low voltage QRS. Nonspecific T wave abnormality.   HR at PAT today was 105.   Echo 11/29/2014: - Left ventricle: The cavity size was normal. Wall thickness was normal. Systolic function was normal. The estimated ejection fraction was in the range of 55% to 65%. - Mitral valve: There was mild regurgitation.  If no changes, I anticipate pt can proceed with surgery as scheduled.   Willeen Cass, FNP-BC [facility]/Anesthesiology Phone: [phone] 04/03/2015 4:58 PM

## 2015-04-11 ENCOUNTER — Other Ambulatory Visit: Payer: Self-pay | Admitting: Plastic Surgery

## 2015-04-11 DIAGNOSIS — C50911 Malignant neoplasm of unspecified site of right female breast: Secondary | ICD-10-CM

## 2015-04-11 NOTE — H&P (Signed)
Jamie Edwards is an 44 y.o. female.   Chief Complaint: right breast cancer HPI: The patient is a 44 yrs old female here for a history and physical for breast reconstruction after bilateral mastectomies planned for tomorrow. She was diagnosed with right breast cancer after feeling a lump on the outer quadrant. It was determined to be locally advanced, ER negative, PR positive, Her2 negative. She underwent chemo (ended 02/02/15) and suffered from bilateral arm DVT and pulmonary embolus for which she now takes Xarelto. She is 5 feet 8 inches tall, weighs 208 pounds and wears a 38 D bra. She has a strond family history of breast cancer in her mom who dies with it at 59 yrs of age. She has a daugther 43 yrs old. She has been told radiation is planned. 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 She would like to undergo a bilateral mastectomy.  Past Medical History  Diagnosis Date  . Medical history non-contributory   . Breast cancer 08/2014    rt.breast ca  . S/P chemotherapy, time since greater than 12 weeks   . Heart murmur     pt. states a doctor stated she had a slight murmur, no studies done. Present PCP has never mention it to her.  . Increased heart rate 11/2014    pt. put on metoprolol  . Pulmonary embolism 11/2014  . DVT (deep venous thrombosis) 11/2014    Past Surgical History  Procedure Laterality Date  . Mouth surgery      implants  . Portacath placement Right 08/31/2014    Procedure: INSERTION PORT-A-CATH WITH ULTRA SOUND GUIDENCE;  Surgeon: Erroll Luna, MD;  Location: [facility];  Service: General;  Laterality: Right;    Family History  Problem Relation Age of Onset  . Breast cancer Mother 69  . Prostate cancer Father 48  . Esophageal cancer Paternal Uncle     smoker  . Breast cancer Cousin    Social History:  reports that she has never smoked. She does not have any smokeless tobacco history on file. She reports that she does not drink alcohol or use  illicit drugs.  Allergies:  Allergies  Allergen Reactions  . Penicillins Other (See Comments)    Stomach upset  . Zithromax [Azithromycin] Diarrhea     (Not in a hospital admission)  No results found for this or any previous visit (from the past 48 hour(s)). No results found.  Review of Systems  Constitutional: Negative.   HENT: Negative.   Eyes: Negative.   Respiratory: Negative.   Cardiovascular: Negative.   Gastrointestinal: Negative.   Genitourinary: Negative.   Musculoskeletal: Negative.   Skin: Negative.   Neurological: Negative.   Psychiatric/Behavioral: Negative.     Last menstrual period 08/25/2014. Physical Exam  Constitutional: She is oriented to person, place, and time. She appears well-developed and well-nourished.  HENT:  Head: Normocephalic and atraumatic.  Eyes: EOM are normal. Pupils are equal, round, and reactive to light.  Cardiovascular: Normal rate.   Respiratory: Effort normal.  GI: Soft.  Neurological: She is alert and oriented to person, place, and time.  Skin: Skin is warm.  Psychiatric: She has a normal mood and affect. Her behavior is normal. Judgment and thought content normal.     Assessment/Plan A long, detailed conversation was had regarding the patient's options for breast reconstruction. Five main points, which are explained to all breast reconstruction patients, were discussed.  1. Breast reconstruction is an optional process.  2. Breast reconstruction is a  multi-stage process which involves multiple surgeries spaced several months apart. The entire process can take over one year.  3. The major goal of breast reconstruction is to have the patient look normal in clothing. When naked, there will always be scars.  4. Asymmetries are often present during the reconstruction process. Several operations may be needed, including surgery to the non-cancerous breast, to achieve satisfactory results.  5. No matter the reconstructive  method, there are ways that the reconstruction can fail and a secondary reconstructive plan would need to be created.   A general discussion regarding all available methods of breast reconstruction were discussed. The types of reconstructions described included.  1. Tissue expander and implant based reconstruction, both single and multi-stage approaches.  2. Autologous only reconstructions, including free abdominal-tissue based reconstructions.  3. Combination procedures, particularly latissismus dorsi flaps combined with either expanders or implants.  For each of the reconstruction methods mentioned above, the risks, benefits, alternatives, scarring, and recovery time were discussed in great detail. Specific risks detailed included bleeding, infection, hematoma, seroma, scarring, pain, wound healing complications, flap loss, fat necrosis, capsular contracture, need for implant removal, donor site complications, bulge, hernia, umbilical necrosis, need for urgent reoperation, and need for dressing changes were discussed.   Assessment  Once all reconstruction options were presented, a focused discussion was had regarding the patient's suitability for each of these procedures.  A total of 50 minutes of face-to-face time was spent in this encounter, of which >50% was spent in counseling. She does not want to have her abdominal area used. She is therefore a candidate for bilateral expander placement at the time of the mastectomy. The right side may need a latissimus after radiation depending on the size she wants to be in the end and how her skin stretches.  Villard 04/11/2015, 3:31 PM

## 2015-04-11 NOTE — H&P (Addendum)
History of Present Illness Marland Kitchen T. Amayia Ciano MD; 02/10/2015 5:35 PM) The patient is a 44 year old female who presents with breast cancer. She returns to the office for surgical treatment planning following completion of neoadjuvant chemotherapy for locally advanced cancer of the right breast. She has completed chemotherapy about one week ago which consisted of dose dense Adriamycin and Cytoxan x4 followed by weekly Taxol and carboplatin x4 stopped for cytopenias, treatment changed to Abraxane weekly 8 starting 12/15/2014. She did have bilateral arm DVT and pulmonary embolus during her treatment and required anticoagulation and remains on this. She however is feeling currently pretty well.  Her initial presentation was: Patint is a 44 YO pre menopausal female referred by Dr. Cleatis Polka for evaluation of recently diagnosed carcinoma of the right breast. The patient has a history of breast cancer in her mother and had been getting yearly mammograms. In September she felt a lump in her upper outer right breast and presented to her family physician in October. At this time it was time for routine mammogram and she was referred for subsequent imaging.. Subsequent imaging included diagnostic mamogram showing a 4.3 cm irregular mass at the 10:00 position of the right breast. and ultrasound showing a 4.3 cm irregular hypoechoic mass at the 10:00 position 8 cm from the nipple. An enodeged suspicious-appearing right axillary lymph node was also noted.. 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 subsequent breast MRI has revealed a large area of abnormal enhancement in the upper outer right breast measuring 4.5 x 7.5 x 4 cm. Enlarged level I axillary lymph nodes were also noted.  Findings at that time were the  following: Tumor size: 7.5 cm Tumor grade: 3 Estrogen Receptor: negative Progesterone Receptor: active Her-2 neu: negative Lymph node status: positive  Follow-up MRI was recently completed showing:IMPRESSION: Right axillary adenopathy has resolved, with only normal appearing lymph nodes of normal size remaining in the right axilla.  The invasive mammary carcinoma in the right upper outer quadrant has significantly decreased in size and in degree of enhancement. Enhancement continues to extend back to the chest wall with no findings specific for invasion. There remain areas of non masslike enhancement consistent with residual malignancy.  There are also 3 small new masses more anteriorly in the right breast, 1 in the subareolar 8 o'clock position and the other 2 adjacent to 1 another at the middle third depth in the 9 o'clock position. While the development of new areas of malignancy during neoadjuvant chemotherapy would be unlikely, if definitive surgical therapy is contemplated prior to obtaining another MRI scan, histologic diagnosis of these three areas would be suggested, as they are located well anterior to the previously biopsied mass.  Of note is the residual area of enhancement still measures up to 9 cm and there are 3 new small masses in the right breast including subareolar.  She has undergone genetic testing and is positive for the PALB2 mutation which by report predicts an approximately 50% risk of subsequent breast cancer.   Allergies Elbert Ewings, CMA; 02/10/2015 11:15 AM) No Known Drug Allergies05/03/2015  Medication History Elbert Ewings, CMA; 02/10/2015 11:15 AM) Hydrocodone-Homatropine (5-1.5MG /5ML Syrup, Oral) Active. Oxycodone-Acetaminophen (5-325MG  Tablet, Oral) Active. LORazepam (0.5MG  Tablet, Oral) Active. Azithromycin (250MG  Tablet, Oral) Active. Dexamethasone (4MG  Tablet, Oral) Active. Lidocaine-Prilocaine (2.5-2.5% Cream, External)  Active. Metoprolol Tartrate (25MG  Tablet,  Oral) Active. Ondansetron HCl (8MG  Tablet, Oral) Active. Prochlorperazine Maleate (10MG  Tablet, Oral) Active. Xarelto (20MG  Tablet, Oral) Active. Xarelto Starter Pack (15 & 20MG  Tab Ther Pack, Oral) Active. Medications Reconciled  Vitals Elbert Ewings CMA; 02/10/2015 11:16 AM) 02/10/2015 11:15 AM Weight: 208 lb Height: 68in Body Surface Area: 2.13 m Body Mass Index: 31.63 kg/m Temp.: 98.47F(Oral)  Pulse: 88 (Regular)  Resp.: 17 (Unlabored)  BP: 130/70 (Sitting, Left Arm, Standard)    Physical Exam Marland Kitchen T. Analys Ryden MD; 02/10/2015 5:33 PM) The physical exam findings are as follows: Note:General: Well-developed F American female no distress HEENT: Alopecia. No palpable masses Lungs: Clear equal breath sounds bilaterally Lymph nodes: No cervical, supraclavicular or axillary nodes palpable Breasts: I can feel an approximately 2-3 cm mass in the lateral right breast. No other palpable masses in either breast. No skin changes or nipple inversion. Cardiac: Regular rate and rhythm. No edema Abdomen: Soft and nontender Extremities: No edema Neurologic: Alert and fully oriented. Gait normal.    Assessment & Plan Marland Kitchen T. Letecia Arps MD; 02/10/2015 5:39 PM) MALIGNANT NEOPLASM OF UPPER-OUTER QUADRANT OF RIGHT FEMALE BREAST (174.4  C50.411) Impression: I had a long discussion with the patient and her support group she had with her today regarding surgical treatment options. Based on the large area of residual enhancement and on the fact there are 3 new abnormalities in the breast and in addition because she has a genetic mutation that would put her at high risk for subsequent breast cancers I feel that she would be best served with total mastectomy and likely ought to consider bilateral mastectomy due to her genetic mutation. I'm not sure we could get around the residual cancer with lumpectomy and we would have to have her undergo 3  additional biopsies to rule out multicentric disease. She understands these issues and agrees. She will require postmastectomy radiation and therefore I doubt that immediate reconstruction is the best choice on the right. Therefore I don't think that nipple sparing mastectomy would be a good choice. She also has the new mass immediately at the right areolar. I have recommend bilateral mastectomy and right axillary sentinel lymph node biopsy with possible right axillary dissection.  She has seen plastic surgery and bilateral reconstruction is planned  Current Plans  Bilateral total mastectomy with right axillary sentinel lymph node biopsy and possible axillary dissection with immediate tissue expander reconstruction bilateral by Dr. Delle Reining to be stopped 3 days prior to surgery last pill July 3rd  Will also plan Port-A-Cath removal, discussed with pt and husband who agree

## 2015-04-12 ENCOUNTER — Inpatient Hospital Stay (HOSPITAL_COMMUNITY): Payer: Commercial Managed Care - HMO | Admitting: Emergency Medicine

## 2015-04-12 ENCOUNTER — Encounter (HOSPITAL_COMMUNITY)
Admission: RE | Disposition: A | Discharge status: Home / Self Care | Payer: Self-pay | Source: Ambulatory Visit | Attending: Plastic Surgery

## 2015-04-12 ENCOUNTER — Ambulatory Visit (HOSPITAL_COMMUNITY)
Admission: RE | Admit: 2015-04-12 | Discharge: 2015-04-12 | Disposition: A | Discharge status: Home / Self Care | Payer: Commercial Managed Care - HMO | Source: Ambulatory Visit | Attending: General Surgery | Admitting: General Surgery

## 2015-04-12 ENCOUNTER — Inpatient Hospital Stay (HOSPITAL_COMMUNITY): Payer: Commercial Managed Care - HMO | Admitting: Anesthesiology

## 2015-04-12 ENCOUNTER — Inpatient Hospital Stay (HOSPITAL_COMMUNITY)
Admission: RE | Admit: 2015-04-12 | Discharge: 2015-04-14 | DRG: 580 | Disposition: A | Discharge status: Home / Self Care | Payer: Commercial Managed Care - HMO | Source: Ambulatory Visit | Attending: Plastic Surgery | Admitting: Plastic Surgery

## 2015-04-12 ENCOUNTER — Encounter (HOSPITAL_COMMUNITY): Payer: Self-pay | Admitting: Certified Registered Nurse Anesthetist

## 2015-04-12 DIAGNOSIS — Z79891 Long term (current) use of opiate analgesic: Secondary | ICD-10-CM

## 2015-04-12 DIAGNOSIS — C50811 Malignant neoplasm of overlapping sites of right female breast: Secondary | ICD-10-CM | POA: Diagnosis present

## 2015-04-12 DIAGNOSIS — Z7901 Long term (current) use of anticoagulants: Secondary | ICD-10-CM | POA: Diagnosis not present

## 2015-04-12 DIAGNOSIS — Z86711 Personal history of pulmonary embolism: Secondary | ICD-10-CM

## 2015-04-12 DIAGNOSIS — C50911 Malignant neoplasm of unspecified site of right female breast: Secondary | ICD-10-CM

## 2015-04-12 DIAGNOSIS — Z9221 Personal history of antineoplastic chemotherapy: Secondary | ICD-10-CM | POA: Diagnosis not present

## 2015-04-12 DIAGNOSIS — Z79899 Other long term (current) drug therapy: Secondary | ICD-10-CM

## 2015-04-12 DIAGNOSIS — Z1501 Genetic susceptibility to malignant neoplasm of breast: Secondary | ICD-10-CM | POA: Diagnosis not present

## 2015-04-12 DIAGNOSIS — Z171 Estrogen receptor negative status [ER-]: Secondary | ICD-10-CM

## 2015-04-12 DIAGNOSIS — C773 Secondary and unspecified malignant neoplasm of axilla and upper limb lymph nodes: Secondary | ICD-10-CM | POA: Diagnosis present

## 2015-04-12 DIAGNOSIS — Z803 Family history of malignant neoplasm of breast: Secondary | ICD-10-CM

## 2015-04-12 HISTORY — PX: BREAST RECONSTRUCTION WITH PLACEMENT OF TISSUE EXPANDER AND FLEX HD (ACELLULAR HYDRATED DERMIS): SHX6295

## 2015-04-12 HISTORY — DX: Malignant neoplasm of unspecified site of right female breast: C50.911

## 2015-04-12 HISTORY — PX: SIMPLE MASTECTOMY WITH AXILLARY SENTINEL NODE BIOPSY: SHX6098

## 2015-04-12 HISTORY — PX: PORT-A-CATH REMOVAL: SHX5289

## 2015-04-12 HISTORY — PX: MASTECTOMY COMPLETE / SIMPLE: SUR845

## 2015-04-12 HISTORY — PX: MASTECTOMY COMPLETE / SIMPLE W/ SENTINEL NODE BIOPSY: SUR846

## 2015-04-12 LAB — BASIC METABOLIC PANEL
Anion gap: 8 (ref 5–15)
BUN: 8 mg/dL (ref 6–20)
CO2: 27 mmol/L (ref 22–32)
Calcium: 9.4 mg/dL (ref 8.9–10.3)
Chloride: 100 mmol/L — ABNORMAL LOW (ref 101–111)
Creatinine, Ser: 0.72 mg/dL (ref 0.44–1.00)
GFR calc Af Amer: >60 mL/min (ref >60)
GLUCOSE: 171 mg/dL — AB (ref 65–99)
Potassium: 4.3 mmol/L (ref 3.5–5.1)
SODIUM: 135 mmol/L (ref 135–145)

## 2015-04-12 LAB — CBC
HEMATOCRIT: 35.3 % — AB (ref 36.0–46.0)
HEMOGLOBIN: 11.2 g/dL — AB (ref 12.0–15.0)
MCH: 27.5 pg (ref 26.0–34.0)
MCHC: 31.7 g/dL (ref 30.0–36.0)
MCV: 86.7 fL (ref 78.0–100.0)
Platelets: 251 10*3/uL (ref 150–400)
RBC: 4.07 MIL/uL (ref 3.87–5.11)
RDW: 14.5 % (ref 11.5–15.5)
WBC: 14 10*3/uL — AB (ref 4.0–10.5)

## 2015-04-12 LAB — PROTIME-INR
INR: 1.07 (ref 0.00–1.49)
PROTHROMBIN TIME: 14.1 s (ref 11.6–15.2)

## 2015-04-12 LAB — PREGNANCY, URINE: PREG TEST UR: NEGATIVE

## 2015-04-12 SURGERY — SIMPLE MASTECTOMY WITH AXILLARY SENTINEL NODE BIOPSY
Anesthesia: Regional | Site: Chest | Laterality: Right

## 2015-04-12 MED ORDER — NEOSTIGMINE METHYLSULFATE 10 MG/10ML IV SOLN
INTRAVENOUS | Status: AC
  Filled 2015-04-12: qty 1

## 2015-04-12 MED ORDER — POLYETHYLENE GLYCOL 3350 17 G PO PACK: 17.0000 g | PACK | Freq: Every day | ORAL | Status: DC | PRN

## 2015-04-12 MED ORDER — ONDANSETRON HCL 4 MG/2ML IJ SOLN
INTRAMUSCULAR | Status: AC
  Filled 2015-04-12: qty 2

## 2015-04-12 MED ORDER — HYDROMORPHONE HCL 1 MG/ML IJ SOLN
0.2500 mg | INTRAMUSCULAR | Status: DC | PRN
  Administered 2015-04-12 (×3): 0.5 mg via INTRAVENOUS

## 2015-04-12 MED ORDER — MIDAZOLAM HCL 5 MG/5ML IJ SOLN
INTRAMUSCULAR | Status: DC | PRN
  Administered 2015-04-12 (×2): 2 mg via INTRAVENOUS

## 2015-04-12 MED ORDER — PROPOFOL 10 MG/ML IV BOLUS
INTRAVENOUS | Status: DC | PRN
  Administered 2015-04-12 (×2): 60 mg via INTRAVENOUS
  Administered 2015-04-12: 200 mg via INTRAVENOUS

## 2015-04-12 MED ORDER — CIPROFLOXACIN IN D5W 400 MG/200ML IV SOLN: 400.0000 mg | INTRAVENOUS | Status: DC

## 2015-04-12 MED ORDER — OXYCODONE HCL ER 10 MG PO T12A
10.0000 mg | EXTENDED_RELEASE_TABLET | Freq: Two times a day (BID) | ORAL | Status: DC
  Administered 2015-04-12 – 2015-04-14 (×4): 10 mg via ORAL
  Filled 2015-04-12 (×4): qty 1

## 2015-04-12 MED ORDER — METHYLENE BLUE 1 % INJ SOLN
INTRAMUSCULAR | Status: AC
  Filled 2015-04-12: qty 10

## 2015-04-12 MED ORDER — HYDROCODONE-ACETAMINOPHEN 5-325 MG PO TABS
ORAL_TABLET | ORAL | Status: AC
  Administered 2015-04-12: 2 via ORAL
  Filled 2015-04-12: qty 2

## 2015-04-12 MED ORDER — LACTATED RINGERS IV SOLN
INTRAVENOUS | Status: DC | PRN
  Administered 2015-04-12 (×3): via INTRAVENOUS

## 2015-04-12 MED ORDER — ADULT MULTIVITAMIN W/MINERALS CH
1.0000 | ORAL_TABLET | Freq: Every day | ORAL | Status: DC
  Administered 2015-04-13 – 2015-04-14 (×2): 1 via ORAL
  Filled 2015-04-12 (×2): qty 1

## 2015-04-12 MED ORDER — DEXAMETHASONE SODIUM PHOSPHATE 4 MG/ML IJ SOLN
INTRAMUSCULAR | Status: AC
  Filled 2015-04-12: qty 1

## 2015-04-12 MED ORDER — LIDOCAINE HCL (CARDIAC) 20 MG/ML IV SOLN
INTRAVENOUS | Status: DC | PRN
  Administered 2015-04-12: 50 mg via INTRAVENOUS

## 2015-04-12 MED ORDER — SUCCINYLCHOLINE CHLORIDE 20 MG/ML IJ SOLN
INTRAMUSCULAR | Status: DC | PRN
  Administered 2015-04-12: 100 mg via INTRAVENOUS

## 2015-04-12 MED ORDER — SODIUM CHLORIDE 0.9 % IJ SOLN
INTRAMUSCULAR | Status: DC | PRN
  Administered 2015-04-12: 5 mL

## 2015-04-12 MED ORDER — CHLORHEXIDINE GLUCONATE 4 % EX LIQD: 1.0000 "application " | Freq: Once | CUTANEOUS | Status: DC

## 2015-04-12 MED ORDER — OXYCODONE HCL ER 10 MG PO T12A: 10.0000 mg | EXTENDED_RELEASE_TABLET | Freq: Two times a day (BID) | ORAL | Status: DC

## 2015-04-12 MED ORDER — TECHNETIUM TC 99M SULFUR COLLOID FILTERED
1.0000 | Freq: Once | INTRAVENOUS | Status: AC | PRN
  Administered 2015-04-12: 1 via INTRADERMAL

## 2015-04-12 MED ORDER — GLYCOPYRROLATE 0.2 MG/ML IJ SOLN
INTRAMUSCULAR | Status: AC
  Filled 2015-04-12: qty 1

## 2015-04-12 MED ORDER — SCOPOLAMINE 1 MG/3DAYS TD PT72
MEDICATED_PATCH | TRANSDERMAL | Status: DC
Start: 2015-04-12 — End: 2015-04-14
  Administered 2015-04-12: 1.5 mg via TRANSDERMAL
  Filled 2015-04-12: qty 1

## 2015-04-12 MED ORDER — 0.9 % SODIUM CHLORIDE (POUR BTL) OPTIME
TOPICAL | Status: DC | PRN
  Administered 2015-04-12 (×4): 1000 mL

## 2015-04-12 MED ORDER — OXYCODONE HCL 5 MG/5ML PO SOLN: 5.0000 mg | Freq: Once | ORAL | Status: DC | PRN

## 2015-04-12 MED ORDER — SODIUM CHLORIDE 0.9 % IJ SOLN
INTRAMUSCULAR | Status: AC
  Filled 2015-04-12: qty 10

## 2015-04-12 MED ORDER — PROPOFOL 10 MG/ML IV BOLUS
INTRAVENOUS | Status: AC
  Filled 2015-04-12: qty 20

## 2015-04-12 MED ORDER — HYDROMORPHONE HCL 1 MG/ML IJ SOLN
INTRAMUSCULAR | Status: AC
Start: 2015-04-12 — End: 2015-04-13
  Filled 2015-04-12: qty 1

## 2015-04-12 MED ORDER — SENNA 8.6 MG PO TABS
1.0000 | ORAL_TABLET | Freq: Two times a day (BID) | ORAL | Status: DC
  Administered 2015-04-12 – 2015-04-14 (×4): 8.6 mg via ORAL
  Filled 2015-04-12 (×4): qty 1

## 2015-04-12 MED ORDER — ROCURONIUM BROMIDE 100 MG/10ML IV SOLN
INTRAVENOUS | Status: DC | PRN
  Administered 2015-04-12: 20 mg via INTRAVENOUS

## 2015-04-12 MED ORDER — GLYCOPYRROLATE 0.2 MG/ML IJ SOLN
INTRAMUSCULAR | Status: DC | PRN
  Administered 2015-04-12: 0.6 mg via INTRAVENOUS

## 2015-04-12 MED ORDER — FENTANYL CITRATE (PF) 250 MCG/5ML IJ SOLN
INTRAMUSCULAR | Status: AC
  Filled 2015-04-12: qty 5

## 2015-04-12 MED ORDER — MIDAZOLAM HCL 2 MG/2ML IJ SOLN
INTRAMUSCULAR | Status: AC
  Filled 2015-04-12: qty 2

## 2015-04-12 MED ORDER — MORPHINE SULFATE 2 MG/ML IJ SOLN
INTRAMUSCULAR | Status: AC
  Filled 2015-04-12: qty 1

## 2015-04-12 MED ORDER — CIPROFLOXACIN IN D5W 400 MG/200ML IV SOLN
INTRAVENOUS | Status: AC
  Administered 2015-04-12: 400 mg via INTRAVENOUS
  Filled 2015-04-12: qty 200

## 2015-04-12 MED ORDER — ONDANSETRON HCL 4 MG/2ML IJ SOLN
INTRAMUSCULAR | Status: DC | PRN
  Administered 2015-04-12: 4 mg via INTRAVENOUS

## 2015-04-12 MED ORDER — DEXAMETHASONE SODIUM PHOSPHATE 4 MG/ML IJ SOLN
INTRAMUSCULAR | Status: DC | PRN
  Administered 2015-04-12: 8 mg via INTRAVENOUS

## 2015-04-12 MED ORDER — ENOXAPARIN SODIUM 40 MG/0.4ML ~~LOC~~ SOLN: 40.0000 mg | SUBCUTANEOUS | Status: DC

## 2015-04-12 MED ORDER — PHENYLEPHRINE HCL 10 MG/ML IJ SOLN
INTRAMUSCULAR | Status: DC | PRN
  Administered 2015-04-12 (×4): 40 ug via INTRAVENOUS
  Administered 2015-04-12: 80 ug via INTRAVENOUS

## 2015-04-12 MED ORDER — SCOPOLAMINE 1 MG/3DAYS TD PT72
1.0000 | MEDICATED_PATCH | TRANSDERMAL | Status: DC
Start: 2015-04-12 — End: 2015-04-12
  Administered 2015-04-12: 1.5 mg via TRANSDERMAL
  Filled 2015-04-12: qty 1

## 2015-04-12 MED ORDER — MIDAZOLAM HCL 5 MG/5ML IJ SOLN: INTRAMUSCULAR | Status: DC | PRN

## 2015-04-12 MED ORDER — CIPROFLOXACIN IN D5W 400 MG/200ML IV SOLN
400.0000 mg | Freq: Two times a day (BID) | INTRAVENOUS | Status: DC
  Administered 2015-04-12 – 2015-04-14 (×4): 400 mg via INTRAVENOUS
  Filled 2015-04-12 (×6): qty 200

## 2015-04-12 MED ORDER — OXYCODONE HCL 5 MG PO TABS: 5.0000 mg | ORAL_TABLET | Freq: Once | ORAL | Status: DC | PRN

## 2015-04-12 MED ORDER — ROCURONIUM BROMIDE 50 MG/5ML IV SOLN
INTRAVENOUS | Status: AC
  Filled 2015-04-12: qty 1

## 2015-04-12 MED ORDER — MORPHINE SULFATE 2 MG/ML IJ SOLN
2.0000 mg | INTRAMUSCULAR | Status: DC | PRN
  Administered 2015-04-12 – 2015-04-13 (×2): 2 mg via INTRAVENOUS
  Filled 2015-04-12 (×2): qty 1

## 2015-04-12 MED ORDER — ACETAMINOPHEN 650 MG RE SUPP: 650.0000 mg | Freq: Four times a day (QID) | RECTAL | Status: DC | PRN

## 2015-04-12 MED ORDER — HYDROMORPHONE HCL 1 MG/ML IJ SOLN
INTRAMUSCULAR | Status: AC
  Administered 2015-04-12: 0.5 mg via INTRAVENOUS
  Filled 2015-04-12: qty 1

## 2015-04-12 MED ORDER — FENTANYL CITRATE (PF) 100 MCG/2ML IJ SOLN: INTRAMUSCULAR | Status: DC | PRN

## 2015-04-12 MED ORDER — SODIUM CHLORIDE 0.9 % IR SOLN
Status: DC | PRN
  Administered 2015-04-12: 500 mL

## 2015-04-12 MED ORDER — MEPERIDINE HCL 25 MG/ML IJ SOLN
6.2500 mg | INTRAMUSCULAR | Status: DC | PRN
Start: 2015-04-12 — End: 2015-04-12

## 2015-04-12 MED ORDER — HYDROCODONE-ACETAMINOPHEN 5-325 MG PO TABS
1.0000 | ORAL_TABLET | ORAL | Status: DC | PRN
  Administered 2015-04-12 (×2): 2 via ORAL
  Administered 2015-04-13: 1 via ORAL
  Administered 2015-04-13 – 2015-04-14 (×5): 2 via ORAL
  Filled 2015-04-12 (×6): qty 2
  Filled 2015-04-12: qty 1

## 2015-04-12 MED ORDER — NEOSTIGMINE METHYLSULFATE 10 MG/10ML IV SOLN
INTRAVENOUS | Status: DC | PRN
  Administered 2015-04-12: 5 mg via INTRAVENOUS

## 2015-04-12 MED ORDER — ONDANSETRON HCL 4 MG/2ML IJ SOLN: 4.0000 mg | Freq: Four times a day (QID) | INTRAMUSCULAR | Status: DC | PRN

## 2015-04-12 MED ORDER — KCL IN DEXTROSE-NACL 20-5-0.45 MEQ/L-%-% IV SOLN
INTRAVENOUS | Status: DC
  Administered 2015-04-12 – 2015-04-13 (×2): via INTRAVENOUS
  Filled 2015-04-12 (×3): qty 1000

## 2015-04-12 MED ORDER — BUPIVACAINE-EPINEPHRINE (PF) 0.5% -1:200000 IJ SOLN
INTRAMUSCULAR | Status: DC | PRN
  Administered 2015-04-12 (×2): 15 mL via PERINEURAL

## 2015-04-12 MED ORDER — FENTANYL CITRATE (PF) 100 MCG/2ML IJ SOLN
INTRAMUSCULAR | Status: DC | PRN
  Administered 2015-04-12: 25 ug via INTRAVENOUS
  Administered 2015-04-12: 100 ug via INTRAVENOUS
  Administered 2015-04-12: 50 ug via INTRAVENOUS
  Administered 2015-04-12 (×2): 25 ug via INTRAVENOUS
  Administered 2015-04-12: 50 ug via INTRAVENOUS
  Administered 2015-04-12: 100 ug via INTRAVENOUS
  Administered 2015-04-12: 50 ug via INTRAVENOUS
  Administered 2015-04-12: 100 ug via INTRAVENOUS
  Administered 2015-04-12: 25 ug via INTRAVENOUS

## 2015-04-12 MED ORDER — DIAZEPAM 2 MG PO TABS
2.0000 mg | ORAL_TABLET | Freq: Two times a day (BID) | ORAL | Status: DC | PRN
  Administered 2015-04-12 – 2015-04-14 (×4): 2 mg via ORAL
  Filled 2015-04-12 (×4): qty 1

## 2015-04-12 MED ORDER — ACETAMINOPHEN 325 MG PO TABS: 650.0000 mg | ORAL_TABLET | Freq: Four times a day (QID) | ORAL | Status: DC | PRN

## 2015-04-12 MED ORDER — HYDROMORPHONE HCL 1 MG/ML IJ SOLN
INTRAMUSCULAR | Status: AC
  Administered 2015-04-12: 19:00:00
  Filled 2015-04-12: qty 1

## 2015-04-12 MED ORDER — ROPIVACAINE HCL 5 MG/ML IJ SOLN
INTRAMUSCULAR | Status: DC | PRN
  Administered 2015-04-12 (×2): 5 mL via PERINEURAL

## 2015-04-12 MED ORDER — PHENYLEPHRINE HCL 10 MG/ML IJ SOLN
10.0000 mg | INTRAVENOUS | Status: DC | PRN
  Administered 2015-04-12: 10 ug/min via INTRAVENOUS

## 2015-04-12 MED ORDER — HYDROMORPHONE HCL 1 MG/ML IJ SOLN: 0.5000 mg | Freq: Once | INTRAMUSCULAR | Status: AC

## 2015-04-12 SURGICAL SUPPLY — 88 items
ADH SKN CLS APL DERMABOND .7 (GAUZE/BANDAGES/DRESSINGS) ×2
APPLIER CLIP 9.375 MED OPEN (MISCELLANEOUS) ×4
APR CLP MED 9.3 20 MLT OPN (MISCELLANEOUS) ×2
BAG DECANTER FOR FLEXI CONT (MISCELLANEOUS) ×4 IMPLANT
BINDER BREAST LRG (GAUZE/BANDAGES/DRESSINGS) IMPLANT
BINDER BREAST XLRG (GAUZE/BANDAGES/DRESSINGS) ×2 IMPLANT
BIOPATCH RED 1 DISK 7.0 (GAUZE/BANDAGES/DRESSINGS) ×6 IMPLANT
BIOPATCH RED 1IN DISK 7.0MM (GAUZE/BANDAGES/DRESSINGS) ×2
BLADE 10 SAFETY STRL DISP (BLADE) ×4 IMPLANT
BNDG PLASTER X FAST 2X3 WHT LF (CAST SUPPLIES) ×2 IMPLANT
BNDG PLSTR 3X2 XFST ST WHT LF (CAST SUPPLIES) ×2
CANISTER SUCTION 2500CC (MISCELLANEOUS) ×12 IMPLANT
CHLORAPREP W/TINT 26ML (MISCELLANEOUS) ×8 IMPLANT
CLIP APPLIE 9.375 MED OPEN (MISCELLANEOUS) IMPLANT
CLIP TI MEDIUM 6 (CLIP) ×4 IMPLANT
CONT SPEC 4OZ CLIKSEAL STRL BL (MISCELLANEOUS) ×8 IMPLANT
COVER PROBE W GEL 5X96 (DRAPES) ×4 IMPLANT
COVER SURGICAL LIGHT HANDLE (MISCELLANEOUS) ×8 IMPLANT
DERMABOND ADVANCED (GAUZE/BANDAGES/DRESSINGS) ×2
DERMABOND ADVANCED .7 DNX12 (GAUZE/BANDAGES/DRESSINGS) ×2 IMPLANT
DEVICE DISSECT PLASMABLAD 3.0S (MISCELLANEOUS) ×2 IMPLANT
DRAIN CHANNEL 19F RND (DRAIN) ×8 IMPLANT
DRAPE CHEST BREAST 15X10 FENES (DRAPES) ×4 IMPLANT
DRAPE ORTHO SPLIT 77X108 STRL (DRAPES) ×8
DRAPE PROXIMA HALF (DRAPES) ×4 IMPLANT
DRAPE SURG 17X23 STRL (DRAPES) ×16 IMPLANT
DRAPE SURG ORHT 6 SPLT 77X108 (DRAPES) ×4 IMPLANT
DRAPE UTILITY XL STRL (DRAPES) ×8 IMPLANT
DRAPE WARM FLUID 44X44 (DRAPE) ×4 IMPLANT
ELECT BLADE 4.0 EZ CLEAN MEGAD (MISCELLANEOUS) ×4
ELECT CAUTERY BLADE 6.4 (BLADE) ×4 IMPLANT
ELECT REM PT RETURN 9FT ADLT (ELECTROSURGICAL) ×12
ELECTRODE BLDE 4.0 EZ CLN MEGD (MISCELLANEOUS) ×2 IMPLANT
ELECTRODE REM PT RTRN 9FT ADLT (ELECTROSURGICAL) ×6 IMPLANT
EVACUATOR SILICONE 100CC (DRAIN) ×8 IMPLANT
EXPANDER BREAST 300CC (Expander) ×4 IMPLANT
GAUZE SPONGE 4X4 12PLY STRL (GAUZE/BANDAGES/DRESSINGS) ×4 IMPLANT
GLOVE BIO SURGEON STRL SZ 6.5 (GLOVE) ×5 IMPLANT
GLOVE BIO SURGEON STRL SZ8 (GLOVE) ×2 IMPLANT
GLOVE BIO SURGEONS STRL SZ 6.5 (GLOVE) ×3
GLOVE BIOGEL PI IND STRL 7.0 (GLOVE) IMPLANT
GLOVE BIOGEL PI IND STRL 8 (GLOVE) ×2 IMPLANT
GLOVE BIOGEL PI INDICATOR 7.0 (GLOVE) ×2
GLOVE BIOGEL PI INDICATOR 8 (GLOVE) ×6
GLOVE ECLIPSE 7.5 STRL STRAW (GLOVE) ×6 IMPLANT
GLOVE SURG SS PI 7.0 STRL IVOR (GLOVE) ×8 IMPLANT
GOWN STRL REUS W/ TWL LRG LVL3 (GOWN DISPOSABLE) ×8 IMPLANT
GOWN STRL REUS W/ TWL XL LVL3 (GOWN DISPOSABLE) ×2 IMPLANT
GOWN STRL REUS W/TWL LRG LVL3 (GOWN DISPOSABLE) ×16
GOWN STRL REUS W/TWL XL LVL3 (GOWN DISPOSABLE) ×4
GRAFT FLEX HD 4X16 THICK (Tissue Mesh) ×4 IMPLANT
KIT BASIN OR (CUSTOM PROCEDURE TRAY) ×8 IMPLANT
KIT ROOM TURNOVER OR (KITS) ×8 IMPLANT
LIQUID BAND (GAUZE/BANDAGES/DRESSINGS) ×4 IMPLANT
NDL 18GX1X1/2 (RX/OR ONLY) (NEEDLE) ×2 IMPLANT
NDL HYPO 25GX1X1/2 BEV (NEEDLE) ×2 IMPLANT
NEEDLE 18GX1X1/2 (RX/OR ONLY) (NEEDLE) ×4 IMPLANT
NEEDLE 22X1 1/2 (OR ONLY) (NEEDLE) ×4 IMPLANT
NEEDLE HYPO 25GX1X1/2 BEV (NEEDLE) ×4 IMPLANT
NS IRRIG 1000ML POUR BTL (IV SOLUTION) ×12 IMPLANT
PACK GENERAL/GYN (CUSTOM PROCEDURE TRAY) ×8 IMPLANT
PAD ABD 8X10 STRL (GAUZE/BANDAGES/DRESSINGS) ×6 IMPLANT
PAD ARMBOARD 7.5X6 YLW CONV (MISCELLANEOUS) ×8 IMPLANT
PIN SAFETY STERILE (MISCELLANEOUS) ×4 IMPLANT
PLASMABLADE 3.0S (MISCELLANEOUS) ×8
SET ASEPTIC TRANSFER (MISCELLANEOUS) ×2 IMPLANT
SPECIMEN JAR X LARGE (MISCELLANEOUS) ×4 IMPLANT
SPONGE GAUZE 4X4 12PLY STER LF (GAUZE/BANDAGES/DRESSINGS) ×2 IMPLANT
SPONGE LAP 18X18 X RAY DECT (DISPOSABLE) ×8 IMPLANT
STAPLER VISISTAT 35W (STAPLE) IMPLANT
SUT ETHILON 2 0 FS 18 (SUTURE) ×4 IMPLANT
SUT MNCRL AB 4-0 PS2 18 (SUTURE) ×10 IMPLANT
SUT MON AB 3-0 SH 27 (SUTURE) ×8
SUT MON AB 3-0 SH27 (SUTURE) ×2 IMPLANT
SUT MON AB 5-0 PS2 18 (SUTURE) ×6 IMPLANT
SUT PDS AB 2-0 CT1 27 (SUTURE) ×18 IMPLANT
SUT SILK 3 0 SH 30 (SUTURE) ×12 IMPLANT
SUT VIC AB 2-0 BRD 54 (SUTURE) ×2 IMPLANT
SUT VIC AB 2-0 SH 18 (SUTURE) ×2 IMPLANT
SUT VIC AB 3-0 54X BRD REEL (SUTURE) IMPLANT
SUT VIC AB 3-0 BRD 54 (SUTURE)
SUT VIC AB 3-0 SH 18 (SUTURE) ×8 IMPLANT
SYR CONTROL 10ML LL (SYRINGE) ×8 IMPLANT
TOWEL OR 17X24 6PK STRL BLUE (TOWEL DISPOSABLE) ×8 IMPLANT
TOWEL OR 17X26 10 PK STRL BLUE (TOWEL DISPOSABLE) ×8 IMPLANT
TRAY FOLEY CATH 14FRSI W/METER (CATHETERS) IMPLANT
TUBE CONNECTING 12'X1/4 (SUCTIONS) ×1
TUBE CONNECTING 12X1/4 (SUCTIONS) ×3 IMPLANT

## 2015-04-12 NOTE — Progress Notes (Signed)
Received from PACU at this time, sleepy but easily arousable, family at bedside, denies nausea/pain, oriented to room and surroundings, bed alarm on, instructed on call light usage

## 2015-04-12 NOTE — Transfer of Care (Signed)
Immediate Anesthesia Transfer of Care Note  Patient: Jamie Edwards  Procedure(s) Performed: Procedure(s): BILATERAL TOTAL MASTECTOMY WITH RIGHT AXILLARY SENTINEL LYMPH  NODE BIOPSY, POSSIBLE AXILLARY DISSECTION, LEFT PROPHYLATIC (Bilateral) IMMEDIATE BILATERAL BREAST RECONSTRUCTION WITH PLACEMENT OF TISSUE EXPANDER AND FLEX HD (ACELLULAR HYDRATED DERMIS) (Bilateral) REMOVAL PORT-A-CATH (Right)  Patient Location: PACU  Anesthesia Type:GA combined with regional for post-op pain  Level of Consciousness: awake, alert , oriented, patient cooperative and responds to stimulation  Airway & Oxygen Therapy: Patient Spontanous Breathing and Patient connected to nasal cannula oxygen  Post-op Assessment: Report given to RN, Post -op Vital signs reviewed and stable, Patient moving all extremities X 4 and Patient able to stick tongue midline  Post vital signs: stable  Last Vitals:  Filed Vitals:   04/12/15 1320  BP:   Pulse:   Temp: 37.1 C  Resp:     Complications: No apparent anesthesia complications

## 2015-04-12 NOTE — Op Note (Signed)
Preoperative Diagnosis: right breast cancer  Postoprative Diagnosis: right breast cancer  Procedure: Procedure(s): BLUE DYE INJECTION RIGHT BREAST, BILATERAL TOTAL MASTECTOMY WITH RIGHT AXILLARY SENTINEL LYMPH  NODE BIOPSY, RIGHT AXILLARY DISSECTION, LEFT PROPHYLATIC REMOVAL PORT-A-CATH   Surgeon: Excell Seltzer T   Assistants: Johnathan Hausen  Anesthesia:  General endotracheal anesthesia  Indications: Patient initially presented with locally advanced cancer of the right breast, T3 N1 biopsy-proven axillary metastasis. She has undergone neoadjuvant chemotherapy with some improvement in her breast tumor and radiographic resolution of her enlarged axillary lymph nodes although she still has a fairly large area of enhancement on MRI of the right breast as well as several new masses that developed during chemotherapy. After extensive preoperative workup and consultation detailed elsewhere we have elected proceed with bilateral mastectomy with right axillary sentinel lymph node biopsy and possible axillary dissection to be followed by immediate reconstruction. Also plan to remove her Port-A-Cath.    Procedure Detail:  Patient was brought to the operating room, placed in supine position on the operating table and general endotracheal anesthesia induced. She was carefully positioned with her arms extended. PAS were in place. She received preoperative IV antibiotics. Initially under sterile technique after patient timeout I injected 5 mL of dilute methylene blue Subcutaneously in the right nipple and massaged this for several minutes. Preoperatively she had undergone injection of 1 mCi of technetium sulfur colloid in the holding area around the right nipple and also undergone bilateral regional block by anesthesia. The left mastectomy was approached initially. An elliptical transverse incision was used encompassing the nipple areola complex and sparing most of the breast skin. Skin and subcutaneous  flaps were then raised superiorly toward the clavicle, medially to the edge of the sternum, inferiorly to the inframammary   Crease and laterally out to the anterior border of the latissimus dorsi which was dissected and defined.  Dissection was deepened down to the chest wall. The breast was then reflected off the chest wall working medial to lateral and superior to inferior dissecting off the pectoralis major and serratus and working out laterally and taking it off the anterior border of latissimus and up toward the axilla. At the low axilla I came across the specimen with clamps and it was removed and this was tied with 3-0 Vicryl. The wound was thoroughly irrigated and complete hemostasis assured. Attention was then toward toward the right side. An identical incision was made and skin flaps raised in identical fashion. The superior flap was extended slightly and the Port-A-Cath removed intact along with suture material. As the specimen was isolated down to the axilla the neoprobe was used to localize 3 sentinel lymph nodes, 2 of them hot and blue and one hot. One of them was somewhat enlarged and firm. This was sent for frozen section. The more suspicious lymph node was positive for metastatic carcinoma. We then proceeded with axillary dissection as planned. The clavipectoral fascia was incised and the pectoralis minor retracted medially. The axillary vein was identified at the medial border of the pectoralis minor. All fibrofatty tissue inferior to the axillary vein was swept laterally and inferiorly. Perforating branches of axillary artery and vein were clipped. I did not feel any other suspicious disease in the axilla. As dissection progressed laterally the thoracodorsal nerve and vessels were identified and protected and the long thoracic nerve of Bell was identified medially and protected. All tissue between these structures down to the subscapularis muscle was swept inferiorly. This dissection continued  inferiorly and then final  attachments below the nerves were divided from the serratus and latissimus and the specimen removed. The wound was thoroughly irrigated and complete hemostasis assured. Dr. Migdalia Dk then proceeded with bilateral reconstruction as planned.    Findings: As above  Estimated Blood Loss:  Minimal         Drains: per Dr.  Migdalia Dk  Blood Given: none          Specimens: #1 left total mastectomy    #2  right axillary sentinel lymph nodes X 3    #3 right modified mastectomy        Complications:  * No complications entered in OR log *         Disposition: PACU - hemodynamically stable.         Condition: stable

## 2015-04-12 NOTE — Anesthesia Procedure Notes (Addendum)
Anesthesia Regional Block:  Pectoralis block  Pre-Anesthetic Checklist: ,, timeout performed, Correct Patient, Correct Site, Correct Laterality, Correct Procedure, Correct Position, site marked, Risks and benefits discussed,  Surgical consent,  Pre-op evaluation,  At surgeon's request and post-op pain management  Laterality: Right and Upper  Prep: chloraprep       Needles:  Injection technique: Single-shot  Needle Type: Echogenic Needle     Needle Length: 9cm 9 cm Needle Gauge: 21 and 21 G    Additional Needles:  Procedures: ultrasound guided (picture in chart) Pectoralis block Narrative:  Start time: 04/12/2015 7:43 AM End time: 04/12/2015 7:48 AM Injection made incrementally with aspirations every 5 mL.  Performed by: Personally  Anesthesiologist: CREWS, DAVID   Anesthesia Regional Block:  Pectoralis block  Pre-Anesthetic Checklist: ,, timeout performed, Correct Patient, Correct Site, Correct Laterality, Correct Procedure, Correct Position, site marked, Risks and benefits discussed,  Surgical consent,  Pre-op evaluation,  At surgeon's request and post-op pain management  Laterality: Left and Upper  Prep: chloraprep       Needles:  Injection technique: Single-shot  Needle Type: Echogenic Needle     Needle Length: 9cm 9 cm Needle Gauge: 21 and 21 G    Additional Needles:  Procedures: ultrasound guided (picture in chart) Pectoralis block Narrative:  Start time: 04/12/2015 7:50 AM End time: 04/12/2015 7:55 AM Injection made incrementally with aspirations every 5 mL.  Performed by: Personally  Anesthesiologist: CREWS, DAVID   Procedure Name: Intubation Date/Time: 04/12/2015 8:37 AM Performed by: Vennie Homans Pre-anesthesia Checklist: Patient identified, Timeout performed, Emergency Drugs available, Suction available and Patient being monitored Patient Re-evaluated:Patient Re-evaluated prior to inductionOxygen Delivery Method: Circle system  utilized Preoxygenation: Pre-oxygenation with 100% oxygen Intubation Type: IV induction Ventilation: Mask ventilation without difficulty Laryngoscope Size: Mac and 3 Grade View: Grade I Tube size: 7.0 mm Number of attempts: 1 Airway Equipment and Method: Stylet Placement Confirmation: ETT inserted through vocal cords under direct vision,  positive ETCO2 and breath sounds checked- equal and bilateral Secured at: 21 cm Tube secured with: Tape Dental Injury: Teeth and Oropharynx as per pre-operative assessment

## 2015-04-12 NOTE — Brief Op Note (Signed)
04/12/2015  11:13 AM  PATIENT:  Io L Cobern  44 y.o. female  PRE-OPERATIVE DIAGNOSIS:  right breast cancer  POST-OPERATIVE DIAGNOSIS:  right breast cancer  PROCEDURE:  Procedure(s): BILATERAL TOTAL MASTECTOMY WITH RIGHT AXILLARY SENTINEL LYMPH  NODE BIOPSY, POSSIBLE AXILLARY DISSECTION, LEFT PROPHYLATIC (Bilateral) IMMEDIATE BILATERAL BREAST RECONSTRUCTION WITH PLACEMENT OF TISSUE EXPANDER AND FLEX HD (ACELLULAR HYDRATED DERMIS) (Bilateral) REMOVAL PORT-A-CATH (Right)  SURGEON:  Surgeon(s) and Role: Panel 1:    * Excell Seltzer, MD - Primary    * Johnathan Hausen, MD - Assisting  Panel 2:    * Claire Sanger, DO - Primary  PHYSICIAN ASSISTANT: Shawn Rayburn, PA  ASSISTANTS: none   ANESTHESIA:   general  EBL:  Total I/O In: 2000 [I.V.:2000] Out: 275 [Urine:225; Blood:50]  BLOOD ADMINISTERED:none  DRAINS: (2) Jackson-Pratt drain(s) with closed bulb suction in the breast pocket with one on each side   LOCAL MEDICATIONS USED:  NONE  SPECIMEN:  No Specimen  DISPOSITION OF SPECIMEN:  N/A  COUNTS:  YES  TOURNIQUET:  * No tourniquets in log *  DICTATION: .Dragon Dictation  PLAN OF CARE: Admit to inpatient   PATIENT DISPOSITION:  PACU - hemodynamically stable.   Delay start of Pharmacological VTE agent (>24hrs) due to surgical blood loss or risk of bleeding: no

## 2015-04-12 NOTE — Op Note (Signed)
Op report    DATE OF OPERATION:  04/12/2015  LOCATION: [facility]  SURGICAL DIVISION: Plastic Surgery  PREOPERATIVE DIAGNOSES:  1. Right Breast cancer.    POSTOPERATIVE DIAGNOSES:  1. Right Breast cancer.   PROCEDURE:  1. Bilateral immediate breast reconstruction with placement of Acellular Dermal Matrix and tissue expanders.  SURGEON: Theodoro Kos, DO  ASSISTANT: Shawn Rayburn, PA  ANESTHESIA:  General.   COMPLICATIONS: None.   IMPLANTS: Left - Mentor Artura 300cc.  120 cc of injectable saline was placed Right - Mentor Artura 300cc. 120 cc of injectable saline was placed Acellular Dermal Matrix 4 x 16 cm  INDICATIONS FOR PROCEDURE:  The patient, Jamie Edwards, is a 44 y.o. female born on November 11, 1970, is here for  immediate first stage breast reconstruction with placement of bilateral tissue expander and Acellular dermal matrix. MRN: [id number]  CONSENT:  Informed consent was obtained directly from the patient. Risks, benefits and alternatives were fully discussed. Specific risks including but not limited to bleeding, infection, hematoma, seroma, scarring, pain, implant infection, implant extrusion, capsular contracture, asymmetry, wound healing problems, and need for further surgery were all discussed. The patient did have an ample opportunity to have her questions answered to her satisfaction.   DESCRIPTION OF PROCEDURE:  The patient was taken to the operating room by the general surgery team. SCDs were placed and IV antibiotics were given. The patient's chest was prepped and draped in a sterile fashion. A time out was performed and the implants to be used were identified.  Bilateral mastectomies were performed.  Once the general surgery team had completed their portion of the case the patient was rendered to the plastic and reconstructive surgery team.  Left Breast:  The pectoralis major muscle was lifted from the chest wall with release of the lateral  edge and lateral inframammary fold.  The pocket was irrigated with antibiotic solution and hemostasis was achieved with electrocautery.  The ADM was then prepared according to the manufacture guidelines and slits placed to help with postoperative fluid management.  The ADM was then sutured to the inferior and lateral edge of the inframammary fold with 2-0 PDS starting with an interrupted stitch and then a running stitch.  The lateral portion was sutured to the muscle with interrupted sutures after the expander was placed.  The expander was prepared according to the manufacture guidelines, the air evacuated and then it was placed under the ADM and pectoralis major muscle.  The inferior and lateral tabs were used to secure the expander to the chest wall with 2-0 PDS.  The expander was then infused with injectable normal saline.  The drain was placed at the inframammary fold over the ADM and secured to the skin with 3-0 Silk.  The deep layers were closed with 3-0 Vicryl followed by 4-0 Monocryl.  The skin was closed with 5-0 Monocryl.   Right Breast:  The pectoralis major muscle was lifted from the chest wall with release of the lateral edge and lateral inframammary fold.  The pocket was irrigated with antibiotic solution and hemostasis was achieved with electrocautery.  The ADM was then prepared according to the manufacture guidelines and slits placed to help with postoperative fluid management.  The ADM was then sutured to the inferior and lateral edge of the inframammary fold with 2-0 PDS starting with an interrupted stitch and then a running stitch.  The lateral portion was sutured to the muscle with interrupted sutures after the expander was placed.  The  expander was prepared according to the manufacture guidelines, the air evacuated and then it was placed under the ADM and pectoralis major muscle.  The inferior and lateral tabs were used to secure the expander to the chest wall with 2-0 PDS.  The drain was  placed at the inframammary fold over the ADM and secured to the skin with 3-0 Silk.  The expander was infused with injectable normal saline.  The deep layers were closed with 3-0 Vicryl followed by 4-0 Monocryl.  The skin was closed with 5-0 Monocryl. Dermabond was applied to both incision sites.  The ABDs and breast binder were placed.  The patient tolerated the procedure well and there were no complications.  The patient was allowed to wake from anesthesia and taken to the recovery room in satisfactory condition.

## 2015-04-12 NOTE — Anesthesia Preprocedure Evaluation (Addendum)
Anesthesia Evaluation  Patient identified by MRN, date of birth, ID band Patient awake    Reviewed: Allergy & Precautions, NPO status , Patient's Chart, lab work & pertinent test results, reviewed documented beta blocker date and time   Airway Mallampati: I  TM Distance: >3 FB Neck ROM: Full    Dental  (+) Teeth Intact, Dental Advisory Given   Pulmonary  Hx of pulmonary embolus in Feb 2016 breath sounds clear to auscultation        Cardiovascular + Peripheral Vascular Disease + Valvular Problems/Murmurs (Mild MR. No SOB or chest pain hx) MR Rhythm:Regular Rate:Normal     Neuro/Psych    GI/Hepatic   Endo/Other  Morbid obesity  Renal/GU      Musculoskeletal   Abdominal   Peds  Hematology   Anesthesia Other Findings   Reproductive/Obstetrics                            Anesthesia Physical Anesthesia Plan  ASA: II  Anesthesia Plan: General and Regional   Post-op Pain Management: MAC Combined w/ Regional for Post-op pain   Induction: Intravenous  Airway Management Planned: Oral ETT  Additional Equipment:   Intra-op Plan:   Post-operative Plan: Extubation in OR  Informed Consent: I have reviewed the patients History and Physical, chart, labs and discussed the procedure including the risks, benefits and alternatives for the proposed anesthesia with the patient or authorized representative who has indicated his/her understanding and acceptance.   Dental advisory given  Plan Discussed with: CRNA, Anesthesiologist and Surgeon  Anesthesia Plan Comments:         Anesthesia Quick Evaluation

## 2015-04-12 NOTE — Anesthesia Postprocedure Evaluation (Signed)
  Anesthesia Post-op Note  Patient: Jamie Edwards  Procedure(s) Performed: Procedure(s): BILATERAL TOTAL MASTECTOMY WITH RIGHT AXILLARY SENTINEL LYMPH  NODE BIOPSY, POSSIBLE AXILLARY DISSECTION, LEFT PROPHYLATIC (Bilateral) IMMEDIATE BILATERAL BREAST RECONSTRUCTION WITH PLACEMENT OF TISSUE EXPANDER AND FLEX HD (ACELLULAR HYDRATED DERMIS) (Bilateral) REMOVAL PORT-A-CATH (Right)  Patient Location: PACU  Anesthesia Type:General  Level of Consciousness: awake and alert   Airway and Oxygen Therapy: Patient Spontanous Breathing  Post-op Pain: mild  Post-op Assessment: Post-op Vital signs reviewed              Post-op Vital Signs: stable  Last Vitals:  Filed Vitals:   04/12/15 1430  BP: 120/62  Pulse: 87  Temp: 36.9 C  Resp: 21    Complications: No apparent anesthesia complications

## 2015-04-12 NOTE — Interval H&P Note (Signed)
History and Physical Interval Note:  04/12/2015 7:43 AM  Jamie Edwards  has presented today for surgery, with the diagnosis of right breast cancer  The various methods of treatment have been discussed with the patient and family. After consideration of risks, benefits and other options for treatment, the patient has consented to  Procedure(s): BILATERAL TOTAL MASTECTOMY WITH RIGHT AXILLARY SENTINEL LYMPH  NODE BIOPSY, POSSIBLE AXILLARY DISSECTION, LEFT PROPHYLATIC (Bilateral) IMMEDIATE BILATERAL BREAST RECONSTRUCTION WITH PLACEMENT OF TISSUE EXPANDER AND FLEX HD (ACELLULAR HYDRATED DERMIS) (Bilateral) as a surgical intervention .  The patient's history has been reviewed, patient examined, no change in status, stable for surgery.  I have reviewed the patient's chart and labs.  Questions were answered to the patient's satisfaction.     SANGER,CLAIRE

## 2015-04-12 NOTE — H&P (View-Only) (Signed)
Jamie Edwards is an 44 y.o. female.   Chief Complaint: right breast cancer HPI: The patient is a 44 yrs old female here for a history and physical for breast reconstruction after bilateral mastectomies planned for tomorrow. She was diagnosed with right breast cancer after feeling a lump on the outer quadrant. It was determined to be locally advanced, ER negative, PR positive, Her2 negative. She underwent chemo (ended 02/02/15) and suffered from bilateral arm DVT and pulmonary embolus for which she now takes Xarelto. She is 5 feet 8 inches tall, weighs 208 pounds and wears a 38 D bra. She has a strond family history of breast cancer in her mom who dies with it at 35 yrs of age. She has a daugther 42 yrs old. She has been told radiation is planned. 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 She would like to undergo a bilateral mastectomy.  Past Medical History  Diagnosis Date  . Medical history non-contributory   . Breast cancer 08/2014    rt.breast ca  . S/P chemotherapy, time since greater than 12 weeks   . Heart murmur     pt. states a doctor stated she had a slight murmur, no studies done. Present PCP has never mention it to her.  . Increased heart rate 11/2014    pt. put on metoprolol  . Pulmonary embolism 11/2014  . DVT (deep venous thrombosis) 11/2014    Past Surgical History  Procedure Laterality Date  . Mouth surgery      implants  . Portacath placement Right 08/31/2014    Procedure: INSERTION PORT-A-CATH WITH ULTRA SOUND GUIDENCE;  Surgeon: Erroll Luna, MD;  Location: [facility];  Service: General;  Laterality: Right;    Family History  Problem Relation Age of Onset  . Breast cancer Mother 42  . Prostate cancer Father 68  . Esophageal cancer Paternal Uncle     smoker  . Breast cancer Cousin    Social History:  reports that she has never smoked. She does not have any smokeless tobacco history on file. She reports that she does not drink alcohol or use  illicit drugs.  Allergies:  Allergies  Allergen Reactions  . Penicillins Other (See Comments)    Stomach upset  . Zithromax [Azithromycin] Diarrhea     (Not in a hospital admission)  No results found for this or any previous visit (from the past 48 hour(s)). No results found.  Review of Systems  Constitutional: Negative.   HENT: Negative.   Eyes: Negative.   Respiratory: Negative.   Cardiovascular: Negative.   Gastrointestinal: Negative.   Genitourinary: Negative.   Musculoskeletal: Negative.   Skin: Negative.   Neurological: Negative.   Psychiatric/Behavioral: Negative.     Last menstrual period 08/25/2014. Physical Exam  Constitutional: She is oriented to person, place, and time. She appears well-developed and well-nourished.  HENT:  Head: Normocephalic and atraumatic.  Eyes: EOM are normal. Pupils are equal, round, and reactive to light.  Cardiovascular: Normal rate.   Respiratory: Effort normal.  GI: Soft.  Neurological: She is alert and oriented to person, place, and time.  Skin: Skin is warm.  Psychiatric: She has a normal mood and affect. Her behavior is normal. Judgment and thought content normal.     Assessment/Plan A long, detailed conversation was had regarding the patient's options for breast reconstruction. Five main points, which are explained to all breast reconstruction patients, were discussed.  1. Breast reconstruction is an optional process.  2. Breast reconstruction is a  multi-stage process which involves multiple surgeries spaced several months apart. The entire process can take over one year.  3. The major goal of breast reconstruction is to have the patient look normal in clothing. When naked, there will always be scars.  4. Asymmetries are often present during the reconstruction process. Several operations may be needed, including surgery to the non-cancerous breast, to achieve satisfactory results.  5. No matter the reconstructive  method, there are ways that the reconstruction can fail and a secondary reconstructive plan would need to be created.   A general discussion regarding all available methods of breast reconstruction were discussed. The types of reconstructions described included.  1. Tissue expander and implant based reconstruction, both single and multi-stage approaches.  2. Autologous only reconstructions, including free abdominal-tissue based reconstructions.  3. Combination procedures, particularly latissismus dorsi flaps combined with either expanders or implants.  For each of the reconstruction methods mentioned above, the risks, benefits, alternatives, scarring, and recovery time were discussed in great detail. Specific risks detailed included bleeding, infection, hematoma, seroma, scarring, pain, wound healing complications, flap loss, fat necrosis, capsular contracture, need for implant removal, donor site complications, bulge, hernia, umbilical necrosis, need for urgent reoperation, and need for dressing changes were discussed.   Assessment  Once all reconstruction options were presented, a focused discussion was had regarding the patient's suitability for each of these procedures.  A total of 50 minutes of face-to-face time was spent in this encounter, of which >50% was spent in counseling. She does not want to have her abdominal area used. She is therefore a candidate for bilateral expander placement at the time of the mastectomy. The right side may need a latissimus after radiation depending on the size she wants to be in the end and how her skin stretches.  Glen Jean 04/11/2015, 3:31 PM

## 2015-04-12 NOTE — Interval H&P Note (Signed)
History and Physical Interval Note:  04/12/2015 8:14 AM  Jamie Edwards  has presented today for surgery, with the diagnosis of right breast cancer  The various methods of treatment have been discussed with the patient and family. After consideration of risks, benefits and other options for treatment, the patient has consented to  Procedure(s): BILATERAL TOTAL MASTECTOMY WITH RIGHT AXILLARY SENTINEL LYMPH  NODE BIOPSY, POSSIBLE AXILLARY DISSECTION, LEFT PROPHYLATIC (Bilateral) IMMEDIATE BILATERAL BREAST RECONSTRUCTION WITH PLACEMENT OF TISSUE EXPANDER AND FLEX HD (ACELLULAR HYDRATED DERMIS) (Bilateral) as a surgical intervention .  The patient's history has been reviewed, patient examined, no change in status, stable for surgery.  I have reviewed the patient's chart and labs.  Questions were answered to the patient's satisfaction.     Staley Budzinski T

## 2015-04-13 ENCOUNTER — Encounter (HOSPITAL_COMMUNITY): Payer: Self-pay | Admitting: General Surgery

## 2015-04-13 MED ORDER — RIVAROXABAN 20 MG PO TABS
20.0000 mg | ORAL_TABLET | Freq: Every day | ORAL | Status: DC
  Administered 2015-04-13: 20 mg via ORAL
  Filled 2015-04-13: qty 1

## 2015-04-13 MED ORDER — METOPROLOL TARTRATE 25 MG PO TABS
25.0000 mg | ORAL_TABLET | Freq: Two times a day (BID) | ORAL | Status: DC
  Administered 2015-04-13 – 2015-04-14 (×3): 25 mg via ORAL
  Filled 2015-04-13 (×3): qty 1

## 2015-04-13 MED ORDER — RIVAROXABAN 20 MG PO TABS: 20.0000 mg | ORAL_TABLET | Freq: Every day | ORAL | Status: DC

## 2015-04-13 NOTE — Progress Notes (Signed)
Patient ID: Jamie Edwards, female   DOB: [birth year], 44 y.o.   MRN: [id number] 1 Day Post-Op  Subjective: Sore but no other C/O  Objective: Vital signs in last 24 hours: Temp:  [98.1 F (36.7 C)-98.9 F (37.2 C)] 98.6 F (37 C) (07/07 0607) Pulse Rate:  [81-103] 93 (07/07 0607) Resp:  [16-25] 17 (07/07 0607) BP: (111-136)/(57-73) 119/72 mmHg (07/07 0607) SpO2:  [95 %-100 %] 99 % (07/07 0607)    Intake/Output from previous day: 07/06 0701 - 07/07 0700 In: 4330 [P.O.:330; I.V.:4000] Out: 2463 [Urine:2125; Drains:188; Blood:150] Intake/Output this shift: Total I/O In: -  Out: 50 [Drains:45]  General appearance: alert, cooperative and no distress Incision/Wound: Flaps look good, no bleeding or unusual drainage.  JP drainage appropiate  Lab Results:   Recent Labs  04/12/15 1635  WBC 14.0*  HGB 11.2*  HCT 35.3*  PLT 251   BMET  Recent Labs  04/12/15 1635  NA 135  K 4.3  CL 100*  CO2 27  GLUCOSE 171*  BUN 8  CREATININE 0.72  CALCIUM 9.4     Studies/Results: Nm Sentinel Node Inj-no Rpt (breast)  04/12/2015   CLINICAL DATA: cancer right breast   Sulfur colloid was injected intradermally by the nuclear medicine  technologist for breast cancer sentinel node localization.    Current Facility-Administered Medications  Medication Dose Route Frequency Provider Last Rate Last Dose  . acetaminophen (TYLENOL) tablet 650 mg  650 mg Oral Q6H PRN Claire Sanger, DO       Or  . acetaminophen (TYLENOL) suppository 650 mg  650 mg Rectal Q6H PRN Claire Sanger, DO      . ciprofloxacin (CIPRO) IVPB 400 mg  400 mg Intravenous Q12H Claire Sanger, DO   400 mg at 04/13/15 0311  . dextrose 5 % and 0.45 % NaCl with KCl 20 mEq/L infusion   Intravenous Continuous Shawn M Rayburn, PA-C 10 mL/hr at 04/13/15 0800    . diazepam (VALIUM) tablet 2 mg  2 mg Oral Q12H PRN Claire Sanger, DO   2 mg at 04/13/15 0315  . HYDROcodone-acetaminophen (NORCO/VICODIN) 5-325 MG per tablet 1-2 tablet   1-2 tablet Oral Q4H PRN Claire Sanger, DO   2 tablet at 04/13/15 0810  . metoprolol tartrate (LOPRESSOR) tablet 25 mg  25 mg Oral BID Excell Seltzer, MD   25 mg at 04/13/15 1004  . morphine 2 MG/ML injection 2 mg  2 mg Intravenous Q4H PRN Claire Sanger, DO   2 mg at 04/12/15 1527  . multivitamin with minerals tablet 1 tablet  1 tablet Oral Daily Claire Sanger, DO   1 tablet at 04/13/15 1004  . ondansetron (ZOFRAN) injection 4 mg  4 mg Intravenous Q6H PRN [employer], DO      . OxyCODONE (OXYCONTIN) 12 hr tablet 10 mg  10 mg Oral Q12H Claire Sanger, DO   10 mg at 04/13/15 1004  . polyethylene glycol (MIRALAX / GLYCOLAX) packet 17 g  17 g Oral Daily PRN Claire Sanger, DO      . rivaroxaban (XARELTO) tablet 20 mg  20 mg Oral Q supper [employer], DO      . senna (SENOKOT) tablet 8.6 mg  1 tablet Oral BID Claire Sanger, DO   8.6 mg at 04/13/15 1004     Anti-infectives: Anti-infectives    Start     Dose/Rate Route Frequency Ordered Stop   04/12/15 1515  ciprofloxacin (CIPRO) IVPB 400 mg     400 mg 200 mL/hr  over 60 Minutes Intravenous Every 12 hours 04/12/15 1514     04/12/15 1050  polymyxin B 500,000 Units, bacitracin 50,000 Units in sodium chloride irrigation 0.9 % 500 mL irrigation  Status:  Discontinued       As needed 04/12/15 1053 04/12/15 1316   04/12/15 0642  ciprofloxacin (CIPRO) IVPB 400 mg  Status:  Discontinued     400 mg 200 mL/hr over 60 Minutes Intravenous On call to O.R. 04/12/15 0642 04/12/15 1456   04/12/15 0558  ciprofloxacin (CIPRO) 400 MG/200ML IVPB    Comments:  Scronce, Trina   : cabinet override      04/12/15 0558 04/12/15 0825      Assessment/Plan: s/p Procedure(s): BILATERAL TOTAL MASTECTOMY WITH RIGHT AXILLARY SENTINEL LYMPH  NODE BIOPSY, POSSIBLE AXILLARY DISSECTION, LEFT PROPHYLATIC IMMEDIATE BILATERAL BREAST RECONSTRUCTION WITH PLACEMENT OF TISSUE EXPANDER AND FLEX HD (ACELLULAR HYDRATED DERMIS) REMOVAL PORT-A-CATH Doing well without complication.   Back on Xarelto.  Surgical findings discussed with pt and family.  Anticipate discharge tomorrow.   LOS: 1 day    Shareta Fishbaugh T 04/13/2015

## 2015-04-13 NOTE — Care Management Note (Signed)
Case Management Note  Patient Details  Name: Jamie Edwards MRN: [id number] Date of Birth: 1971/05/17  Subjective/Objective:                    Action/Plan: UR completed   Expected Discharge Date:                  Expected Discharge Plan:  Home/Self Care  In-House Referral:     Discharge planning Services     Post Acute Care Choice:    Choice offered to:     DME Arranged:    DME Agency:     HH Arranged:    [facility] Agency:     Status of Service:  In process, will continue to follow  Medicare Important Message Given:    Date Medicare IM Given:    Medicare IM give by:    Date Additional Medicare IM Given:    Additional Medicare Important Message give by:     If discussed at [place] of Stay Meetings, dates discussed:    Additional Comments:  Marilu Favre, RN 04/13/2015, 1:30 PM

## 2015-04-13 NOTE — Progress Notes (Signed)
1 Day Post-Op  Subjective: Pain under better control after starting some Oxycontin SR. She is going to have to be at home alone some of the time and is still needing some assistance to get OOB. Will plan for another day here and home tomorrow if general surgery agrees.  She will restart Xarelto today as well. JP drainage currently serosanguinous and not excessive, so will monitor with resumption of Xarelto.   Objective: Vital signs in last 24 hours: Temp:  [98.1 F (36.7 C)-98.9 F (37.2 C)] 98.6 F (37 C) (07/07 0607) Pulse Rate:  [77-118] 93 (07/07 0607) Resp:  [16-25] 17 (07/07 0607) BP: (111-136)/(57-73) 119/72 mmHg (07/07 0607) SpO2:  [95 %-100 %] 99 % (07/07 0607)    Intake/Output from previous day: 07/06 0701 - 07/07 0700 In: 4330 [P.O.:330; I.V.:4000] Out: 2463 [Urine:2125; Drains:188; Blood:150] Intake/Output this shift:    General appearance: alert, cooperative and mild distress Resp: clear to auscultation bilaterally Cardio: regular rate and rhythm Bilateral breast flaps viable and without signs of hematoma or infection.   Lab Results:   Recent Labs  04/12/15 1635  WBC 14.0*  HGB 11.2*  HCT 35.3*  PLT 251   BMET  Recent Labs  04/12/15 1635  NA 135  K 4.3  CL 100*  CO2 27  GLUCOSE 171*  BUN 8  CREATININE 0.72  CALCIUM 9.4   PT/INR  Recent Labs  04/12/15 0657  LABPROT 14.1  INR 1.07   ABG No results for input(s): PHART, HCO3 in the last 72 hours.  Invalid input(s): PCO2, PO2  Studies/Results: Nm Sentinel Node Inj-no Rpt (breast)  04/12/2015   CLINICAL DATA: cancer right breast   Sulfur colloid was injected intradermally by the nuclear medicine  technologist for breast cancer sentinel node localization.     Anti-infectives: Anti-infectives    Start     Dose/Rate Route Frequency Ordered Stop   04/12/15 1515  ciprofloxacin (CIPRO) IVPB 400 mg     400 mg 200 mL/hr over 60 Minutes Intravenous Every 12 hours 04/12/15 1514     04/12/15  1050  polymyxin B 500,000 Units, bacitracin 50,000 Units in sodium chloride irrigation 0.9 % 500 mL irrigation  Status:  Discontinued       As needed 04/12/15 1053 04/12/15 1316   04/12/15 0642  ciprofloxacin (CIPRO) IVPB 400 mg  Status:  Discontinued     400 mg 200 mL/hr over 60 Minutes Intravenous On call to O.R. 04/12/15 0642 04/12/15 1456   04/12/15 0558  ciprofloxacin (CIPRO) 400 MG/200ML IVPB    Comments:  Scronce, Trina   : cabinet override      04/12/15 0558 04/12/15 0825      Assessment/Plan: s/p Procedure(s): BILATERAL TOTAL MASTECTOMY WITH RIGHT AXILLARY SENTINEL LYMPH  NODE BIOPSY, POSSIBLE AXILLARY DISSECTION, LEFT PROPHYLATIC (Bilateral) IMMEDIATE BILATERAL BREAST RECONSTRUCTION WITH PLACEMENT OF TISSUE EXPANDER AND FLEX HD (ACELLULAR HYDRATED DERMIS) (Bilateral) REMOVAL PORT-A-CATH (Right) Continue to mobilize OOB  Will resume Xarelto today Will plan for discharge tomorrow   LOS: 1 day    Jeromy Borcherding,PA-C Plastic Surgery [phone]

## 2015-04-14 MED ORDER — DIAZEPAM 2 MG PO TABS: 2.0000 mg | ORAL_TABLET | Freq: Four times a day (QID) | ORAL | Status: DC | PRN

## 2015-04-14 MED ORDER — ACETAMINOPHEN 325 MG PO TABS: 650.0000 mg | ORAL_TABLET | Freq: Four times a day (QID) | ORAL | Status: DC | PRN

## 2015-04-14 MED ORDER — SENNA 8.6 MG PO TABS: 1.0000 | ORAL_TABLET | Freq: Two times a day (BID) | ORAL | Status: DC

## 2015-04-14 MED ORDER — OXYCODONE HCL ER 10 MG PO T12A: 10.0000 mg | EXTENDED_RELEASE_TABLET | Freq: Two times a day (BID) | ORAL | Status: DC

## 2015-04-14 MED ORDER — OXYCODONE HCL 5 MG PO TABS: 5.0000 mg | ORAL_TABLET | ORAL | Status: DC | PRN

## 2015-04-14 MED ORDER — POLYETHYLENE GLYCOL 3350 17 G PO PACK
17.0000 g | PACK | Freq: Every day | ORAL | Status: DC | PRN
Start: 2015-04-14 — End: 2016-01-20

## 2015-04-14 NOTE — Progress Notes (Signed)
2 Days Post-Op  Subjective: Pain under adequate control with Oxycontin SR and short acting opiate.  Ready for discharge.  Xarelto resumed.  Bilateral breast flaps viable and without signs of infection or hematoma.  JP drainage appropriate and serosanguinous.   Objective: Vital signs in last 24 hours: Temp:  [98.1 F (36.7 C)-98.8 F (37.1 C)] 98.5 F (36.9 C) (07/08 0624) Pulse Rate:  [91-110] 110 (07/08 0624) Resp:  [16-20] 18 (07/08 0624) BP: (115-128)/(55-94) 128/55 mmHg (07/08 0624) SpO2:  [98 %-100 %] 100 % (07/08 0624)    Intake/Output from previous day: 07/07 0701 - 07/08 0700 In: 1060 [P.O.:1060] Out: 1325 [Urine:1200; Drains:125] Intake/Output this shift:    General appearance: alert, cooperative, appears stated age and mild distress Resp: clear to auscultation bilaterally Cardio: regular rate and rhythm Bilateral breast flaps viable and without signs of hematoma or infection. JP drainage serosanguinous  Lab Results:   Recent Labs  04/12/15 1635  WBC 14.0*  HGB 11.2*  HCT 35.3*  PLT 251   BMET  Recent Labs  04/12/15 1635  NA 135  K 4.3  CL 100*  CO2 27  GLUCOSE 171*  BUN 8  CREATININE 0.72  CALCIUM 9.4   PT/INR  Recent Labs  04/12/15 0657  LABPROT 14.1  INR 1.07   ABG No results for input(s): PHART, HCO3 in the last 72 hours.  Invalid input(s): PCO2, PO2  Studies/Results: Nm Sentinel Node Inj-no Rpt (breast)  04/12/2015   CLINICAL DATA: cancer right breast   Sulfur colloid was injected intradermally by the nuclear medicine  technologist for breast cancer sentinel node localization.     Anti-infectives: Anti-infectives    Start     Dose/Rate Route Frequency Ordered Stop   04/12/15 1515  ciprofloxacin (CIPRO) IVPB 400 mg     400 mg 200 mL/hr over 60 Minutes Intravenous Every 12 hours 04/12/15 1514     04/12/15 1050  polymyxin B 500,000 Units, bacitracin 50,000 Units in sodium chloride irrigation 0.9 % 500 mL irrigation  Status:   Discontinued       As needed 04/12/15 1053 04/12/15 1316   04/12/15 0642  ciprofloxacin (CIPRO) IVPB 400 mg  Status:  Discontinued     400 mg 200 mL/hr over 60 Minutes Intravenous On call to O.R. 04/12/15 0642 04/12/15 1456   04/12/15 0558  ciprofloxacin (CIPRO) 400 MG/200ML IVPB    Comments:  Scronce, Trina   : cabinet override      04/12/15 0558 04/12/15 0825      Assessment/Plan: s/p Procedure(s): BILATERAL TOTAL MASTECTOMY WITH RIGHT AXILLARY SENTINEL LYMPH  NODE BIOPSY, POSSIBLE AXILLARY DISSECTION, LEFT PROPHYLATIC (Bilateral) IMMEDIATE BILATERAL BREAST RECONSTRUCTION WITH PLACEMENT OF TISSUE EXPANDER AND FLEX HD (ACELLULAR HYDRATED DERMIS) (Bilateral) REMOVAL PORT-A-CATH (Right) Discharge  follow up in office next Tuesday  LOS: 2 days   Jamie Osmon,PA-C Plastic Surgery [phone]

## 2015-04-14 NOTE — Progress Notes (Signed)
IV removed per order. Discharge instructions given and explained to patient. Gave dressing materials and cups (marked) to empty [employer] drains. Discharged via wheelchair to husband's care with NT present.

## 2015-04-14 NOTE — Discharge Summary (Signed)
Physician Discharge Summary  Patient ID: Jamie Edwards MRN: [id number] DOB/AGE: [birth year] 43 y.o.  Admit date: 04/12/2015 Discharge date: 04/14/2015  Admission Diagnoses:  Discharge Diagnoses:  Active Problems:   Breast cancer, right breast   Discharged Condition: good  Hospital Course: Indications: Patient initially presented with locally advanced cancer of the right breast, T3 N1 biopsy-proven axillary metastasis. She has undergone neoadjuvant chemotherapy with some improvement in her breast tumor and radiographic resolution of her enlarged axillary lymph nodes although she still has a fairly large area of enhancement on MRI of the right breast as well as several new masses that developed during chemotherapy. After extensive preoperative workup and consultation detailed elsewhere we have elected proceed with bilateral mastectomy with right axillary sentinel lymph node biopsy and axillary dissection to be followed by immediate reconstruction with placement of bilateral tissue expanders and FlexHD. She underwent the procedures without complication and was discharged home with family.    Consults: None  Significant Diagnostic Studies: Pathology pending at discharge  Treatments: surgery: Procedure: Procedure(s): BLUE DYE INJECTION RIGHT BREAST, BILATERAL TOTAL MASTECTOMY WITH RIGHT AXILLARY SENTINEL LYMPH  NODE BIOPSY, RIGHT AXILLARY DISSECTION, LEFT PROPHYLATIC REMOVAL PORT-A-CATH Dr. Excell Seltzer Bilateral immediate breast reconstruction with placement of ADM and tissue expanders- 300 cc [employer] with 120 cc saline instilled bilaterally.     Discharge Exam: Blood pressure 128/55, pulse 110, temperature 98.5 F (36.9 C), temperature source Oral, resp. rate 18, height 5\' 8"  (1.727 m), weight 94.433 kg (208 lb 3 oz), SpO2 100 %. General appearance: alert, cooperative, appears stated age and mild distress Resp: clear to auscultation bilaterally Cardio: regular rate and  rhythm Bilateral breast flaps are viable and without signs of infection or hematoma. JP output is moderate and serosanguinous.   Disposition: 01-Home or Self Care  Discharge Instructions    Care order/instruction    Complete by:  As directed   Please instruct patient in JP drain care and send home with sheet to record volumes            Medication List    TAKE these medications        acetaminophen 325 MG tablet  Commonly known as:  TYLENOL  Take 2 tablets (650 mg total) by mouth every 6 (six) hours as needed for mild pain (or Temp > 100).     ALPRAZolam 0.25 MG tablet  Commonly known as:  XANAX  Take 0.25 mg by mouth 2 (two) times daily as needed for anxiety.     diazepam 2 MG tablet  Commonly known as:  VALIUM  Take 1 tablet (2 mg total) by mouth every 6 (six) hours as needed for muscle spasms.     metoprolol tartrate 25 MG tablet  Commonly known as:  LOPRESSOR  TAKE 1 TABLET (25 MG TOTAL) BY MOUTH 2 (TWO) TIMES DAILY.     OxyCODONE 10 mg T12a 12 hr tablet  Commonly known as:  OXYCONTIN  Take 1 tablet (10 mg total) by mouth every 12 (twelve) hours.     oxyCODONE 5 MG immediate release tablet  Commonly known as:  ROXICODONE  Take 1 tablet (5 mg total) by mouth every 4 (four) hours as needed for severe pain or breakthrough pain.     oxyCODONE-acetaminophen 5-325 MG per tablet  Commonly known as:  ROXICET  Take 1-2 tablets by mouth every 4 (four) hours as needed.     polyethylene glycol packet  Commonly known as:  MIRALAX / GLYCOLAX  Take 17 g by  mouth daily as needed for mild constipation or moderate constipation.     senna 8.6 MG Tabs tablet  Commonly known as:  SENOKOT  Take 1 tablet (8.6 mg total) by mouth 2 (two) times daily.     XARELTO 20 MG Tabs tablet  Generic drug:  rivaroxaban  TAKE 1 TABLET (20 MG TOTAL) BY MOUTH DAILY WITH SUPPER.           Follow-up Information    Follow up with HOXWORTH,BENJAMIN T, MD In 2 weeks.   Specialty:  General  Surgery   Contact information:   [address]  [place] [address] [phone]       Follow up with [facility], DO In 1 week.   Specialty:  Plastic Surgery   Contact information:   [place] [address] [phone]       Signed: Ulysees [facility]s Plastic Surgery [phone]

## 2015-04-20 NOTE — Assessment & Plan Note (Signed)
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 Plan: 1. Adjuvant Xeloda 2. Radiation therapy   

## 2015-04-21 ENCOUNTER — Encounter: Payer: Self-pay | Admitting: Hematology and Oncology

## 2015-04-21 ENCOUNTER — Telehealth: Payer: Self-pay | Admitting: Hematology and Oncology

## 2015-04-21 ENCOUNTER — Ambulatory Visit (HOSPITAL_BASED_OUTPATIENT_CLINIC_OR_DEPARTMENT_OTHER): Payer: Commercial Managed Care - HMO | Admitting: Hematology and Oncology

## 2015-04-21 VITALS — BP 96/62 | HR 96 | Temp 99.0°F | Resp 20 | Ht 68.0 in | Wt 204.3 lb

## 2015-04-21 DIAGNOSIS — C50411 Malignant neoplasm of upper-outer quadrant of right female breast: Secondary | ICD-10-CM | POA: Diagnosis not present

## 2015-04-21 DIAGNOSIS — Z171 Estrogen receptor negative status [ER-]: Secondary | ICD-10-CM | POA: Diagnosis not present

## 2015-04-21 DIAGNOSIS — C773 Secondary and unspecified malignant neoplasm of axilla and upper limb lymph nodes: Secondary | ICD-10-CM | POA: Diagnosis not present

## 2015-04-21 MED ORDER — OXYCODONE HCL ER 10 MG PO T12A: 10.0000 mg | EXTENDED_RELEASE_TABLET | Freq: Two times a day (BID) | ORAL | Status: DC

## 2015-04-21 NOTE — Addendum Note (Signed)
Addended by: Prentiss Bells on: 04/21/2015 11:53 AM   Modules accepted: Orders

## 2015-04-21 NOTE — Progress Notes (Signed)
Patient Care Team: Kelton Pillar, MD as PCP - General (Family Medicine) Nicholas Lose, MD as Consulting Physician (Hematology and Oncology) Excell Seltzer, MD as Consulting Physician (General Surgery) Eppie Gibson, MD as Attending Physician (Radiation Oncology) Holley Bouche, NP as Nurse Practitioner (Nurse Practitioner)  DIAGNOSIS: Breast cancer of upper-outer quadrant of right female breast   Staging form: Breast, AJCC 7th Edition     Clinical: Stage IIIA (T3, N1, M0) - Unsigned       Staging comments: Staged at breast conference 11.11.15    SUMMARY OF ONCOLOGIC HISTORY:   Breast cancer of upper-outer quadrant of right female breast   08/05/2014 Initial Diagnosis Invasive ductal carcinoma, 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, grade 3 ER 0%, PR 0%, HER-2 negative, Ki-67 90%: Right axillary lymph node positive for breast cancer   08/12/2014 Breast MRI 4.5x 7.5 x4 cm biopsy-proven right breast upper outer quadrant cancer with biopsy-proven level I right axillary lymph node with 2 level II right axillary lymph nodes identified   09/08/2014 - 02/02/2015 Neo-Adjuvant Chemotherapy Dose dense Adriamycin and Cytoxan x4 followed by weekly Taxol and carboplatin x4; Abraxane 8   02/01/2015 Breast MRI Marked improvement in the breast tumor and resolution of the right axillary lymph node, there are 3 new spots in the breast one beneath the area of unclear significance   04/13/2015 Surgery Bilateral Mastectomy; Right: IDC 5.7 cm grade 3, 3/8 LN pos (1 with micro met and 1 with ECE) Left Mastectomy: Benign; T3N1 (stage IIIa)    CHIEF COMPLIANT: Follow-up after bilateral mastectomies  INTERVAL HISTORY: Jamie Edwards is a 44 year old with above-mentioned history of right-sided breast cancer who had triple negative disease and undergone neo-adjuvant chemotherapy followed by mastectomy. She had bilateral mastectomies because she had PALB2 mutation. Final pathology  revealed large amount of gross residual disease with 3/8 lymph nodes positive. She also had a breast expander in place.  REVIEW OF SYSTEMS:   Constitutional: Denies fevers, chills or abnormal weight loss Eyes: Denies blurriness of vision Ears, nose, mouth, throat, and face: Denies mucositis or sore throat Respiratory: Denies cough, dyspnea or wheezes Cardiovascular: Denies palpitation, chest discomfort or lower extremity swelling Gastrointestinal:  Denies nausea, heartburn or change in bowel habits Skin: Denies abnormal skin rashes Lymphatics: Denies new lymphadenopathy or easy bruising Neurological:Denies numbness, tingling or new weaknesses Behavioral/Psych: Mood is stable, no new changes  Breast: Patient is very sore from recent surgeries and reconstruction All other systems were reviewed with the patient and are negative.  I have reviewed the past medical history, past surgical history, social history and family history with the patient and they are unchanged from previous note.  ALLERGIES:  is allergic to erythromycin base; penicillins; and zithromax.  MEDICATIONS:  Current Outpatient Prescriptions  Medication Sig Dispense Refill  . acetaminophen (TYLENOL) 325 MG tablet Take 2 tablets (650 mg total) by mouth every 6 (six) hours as needed for mild pain (or Temp > 100).    . ALPRAZolam (XANAX) 0.25 MG tablet Take 0.25 mg by mouth 2 (two) times daily as needed for anxiety.    . diazepam (VALIUM) 2 MG tablet Take 1 tablet (2 mg total) by mouth every 6 (six) hours as needed for muscle spasms. 30 tablet 0  . furosemide (LASIX) 20 MG tablet     . metoprolol tartrate (LOPRESSOR) 25 MG tablet TAKE 1 TABLET (25 MG TOTAL) BY MOUTH 2 (TWO) TIMES DAILY. 60 tablet 1  . OxyCODONE (OXYCONTIN) 10 mg T12A 12  hr tablet Take 1 tablet (10 mg total) by mouth every 12 (twelve) hours. 14 tablet 0  . oxyCODONE (ROXICODONE) 5 MG immediate release tablet Take 1 tablet (5 mg total) by mouth every 4 (four)  hours as needed for severe pain or breakthrough pain. 40 tablet 0  . oxyCODONE-acetaminophen (ROXICET) 5-325 MG per tablet Take 1-2 tablets by mouth every 4 (four) hours as needed. 30 tablet 0  . polyethylene glycol (MIRALAX / GLYCOLAX) packet Take 17 g by mouth daily as needed for mild constipation or moderate constipation. 14 each 0  . senna (SENOKOT) 8.6 MG TABS tablet Take 1 tablet (8.6 mg total) by mouth 2 (two) times daily. 120 each 0  . XARELTO 20 MG TABS tablet TAKE 1 TABLET (20 MG TOTAL) BY MOUTH DAILY WITH SUPPER. 30 tablet 2   No current facility-administered medications for this visit.    PHYSICAL EXAMINATION: ECOG PERFORMANCE STATUS: 1 - Symptomatic but completely ambulatory  Filed Vitals:   04/21/15 0859  BP: 96/62  Pulse: 96  Temp: 99 F (37.2 C)  Resp: 20   Filed Weights   04/21/15 0859  Weight: 204 lb 4.8 oz (92.67 kg)    GENERAL:alert, no distress and comfortable SKIN: skin color, texture, turgor are normal, no rashes or significant lesions EYES: normal, Conjunctiva are pink and non-injected, sclera clear OROPHARYNX:no exudate, no erythema and lips, buccal mucosa, and tongue normal  NECK: supple, thyroid normal size, non-tender, without nodularity LYMPH:  no palpable lymphadenopathy in the cervical, axillary or inguinal LUNGS: clear to auscultation and percussion with normal breathing effort HEART: regular rate & rhythm and no murmurs and no lower extremity edema ABDOMEN:abdomen soft, non-tender and normal bowel sounds Musculoskeletal:no cyanosis of digits and no clubbing  NEURO: alert & oriented x 3 with fluent speech, no focal motor/sensory deficits   LABORATORY DATA:  I have reviewed the data as listed   Chemistry      Component Value Date/Time   NA 135 04/12/2015 1635   NA 142 02/22/2015 0800   K 4.3 04/12/2015 1635   K 3.8 02/22/2015 0800   CL 100* 04/12/2015 1635   CO2 27 04/12/2015 1635   CO2 29 02/22/2015 0800   BUN 8 04/12/2015 1635   BUN  13.1 02/22/2015 0800   CREATININE 0.72 04/12/2015 1635   CREATININE 0.8 02/22/2015 0800      Component Value Date/Time   CALCIUM 9.4 04/12/2015 1635   CALCIUM 10.1 02/22/2015 0800   ALKPHOS 82 02/22/2015 0800   AST 45* 02/22/2015 0800   ALT 39 02/22/2015 0800   BILITOT 0.32 02/22/2015 0800       Lab Results  Component Value Date   WBC 14.0* 04/12/2015   HGB 11.2* 04/12/2015   HCT 35.3* 04/12/2015   MCV 86.7 04/12/2015   PLT 251 04/12/2015   NEUTROABS 3.7 02/22/2015   ASSESSMENT & PLAN:  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  Plan: 1. Radiation therapy consultation with Dr. Isidore Moos 2. Adjuvant Xeloda: Based on [place] trial as well as FinXX study in triple negative breast cancers, Xeloda was shown to improve survival. Return to clinic in 3 months to discuss starting adjuvant Xeloda.   Orders Placed This Encounter  Procedures  . CBC with Differential    Standing Status: Future     Number of Occurrences:      Standing Expiration Date: 04/20/2016  . Comprehensive metabolic panel (Cmet) - [facility]    Standing Status: Future     Number of Occurrences:      Standing Expiration Date: 04/20/2016  . Ambulatory referral to Radiation Oncology    Referral Priority:  Routine    Referral Type:  Consultation    Referral Reason:  Specialty Services Required    Referred to Provider:  Eppie Gibson, MD     Requested Specialty:  Radiation Oncology    Number of Visits Requested:  1   The patient has a good understanding of the overall plan. she agrees with it. she will call with any problems that may develop before the next visit here.   Rulon Eisenmenger, MD

## 2015-04-21 NOTE — Telephone Encounter (Signed)
Gave avs & calendar for October. °

## 2015-04-26 ENCOUNTER — Ambulatory Visit
Admission: RE | Admit: 2015-04-26 | Discharge: 2015-04-26 | Disposition: A | Discharge status: Home / Self Care | Payer: Commercial Managed Care - HMO | Source: Ambulatory Visit | Attending: Radiation Oncology | Admitting: Radiation Oncology

## 2015-04-26 ENCOUNTER — Encounter: Payer: Self-pay | Admitting: Radiation Oncology

## 2015-04-26 VITALS — BP 153/110 | HR 103 | Temp 98.4°F | Resp 12 | Wt 202.2 lb

## 2015-04-26 DIAGNOSIS — C50911 Malignant neoplasm of unspecified site of right female breast: Secondary | ICD-10-CM

## 2015-04-26 DIAGNOSIS — Z86718 Personal history of other venous thrombosis and embolism: Secondary | ICD-10-CM | POA: Diagnosis not present

## 2015-04-26 DIAGNOSIS — Z881 Allergy status to other antibiotic agents status: Secondary | ICD-10-CM | POA: Insufficient documentation

## 2015-04-26 DIAGNOSIS — Z7901 Long term (current) use of anticoagulants: Secondary | ICD-10-CM | POA: Diagnosis not present

## 2015-04-26 DIAGNOSIS — Z880 Allergy status to penicillin: Secondary | ICD-10-CM | POA: Diagnosis not present

## 2015-04-26 DIAGNOSIS — Z9109 Other allergy status, other than to drugs and biological substances: Secondary | ICD-10-CM | POA: Diagnosis not present

## 2015-04-26 DIAGNOSIS — Z86711 Personal history of pulmonary embolism: Secondary | ICD-10-CM | POA: Diagnosis not present

## 2015-04-26 DIAGNOSIS — Z510 Encounter for antineoplastic radiation therapy: Secondary | ICD-10-CM | POA: Insufficient documentation

## 2015-04-26 DIAGNOSIS — C50411 Malignant neoplasm of upper-outer quadrant of right female breast: Secondary | ICD-10-CM | POA: Diagnosis present

## 2015-04-26 NOTE — Progress Notes (Addendum)
Location of Breast Cancer: right invasive ductal carcinoma   Histology per Pathology Report:   Receptor Status: ER(-), PR (-), Her2-neu (-)  Did patient present with symptoms (if so, please note symptoms) or was this found on screening mammography?: Noted a lump on a self breast exam- Oct 2015, then further discovered on mammogram  Past/Anticipated interventions by surgeon, if any: 04/12/15 Bilateral mastectomy    Past/Anticipated interventions by medical oncology, if any: Dr. Lindi Adie: Chemotherapy: Neoadj chemo Adriamycin and Cytoxan x 4 followed by weekly taxol carbo x 4 (stopped for cytopenias).  Treatment changed to Abraxane x 8 started 09/08/14 completed 02/03/15  Lymphedema issues, if any: denies  Pain issues, if any:  She reports pain of a 4 on the pain scale, described as pressure "elephant" over tissue breast expander, intermittent pain SAFETY ISSUES:  Prior radiation? no  Pacemaker/ICD? no  Possible current pregnancy? no  Is the patient on methotrexate? no  Current Complaints / other details:  PALB2 mutation.  She had bilateral mastectomies due to concern with gene mutation. Breast expander in place.  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   Reports leg spasms, but Valium is helping.  Has bilateral breast drains.      Jenene Slicker, RN 04/26/2015,8:18 AM

## 2015-04-26 NOTE — Progress Notes (Signed)
Radiation Oncology         [phone] ________________________________  Name: Jamie Edwards MRN: [id number]  Date: 04/26/2015  DOB: 1971/06/13  Follow-Up Visit Note  Outpatient  CC: Osborne Casco, MD  Excell Seltzer, MD  Diagnosis:      ICD-9-CM ICD-10-CM   1. Breast cancer of upper-outer quadrant of right female breast 174.4 C50.411 Ambulatory referral to Social Work     Ambulatory referral to Physical Therapy     Ambulatory referral to Social Work  2. Breast cancer, right breast 174.9 C50.911 Ambulatory referral to Social Work     Ambulatory referral to Physical Therapy     Ambulatory referral to Social Work     Clinical stage IIIa (T3, N1, M0) Breast cancer of upper-outer quadrant of right female breast, IDC,Triple Negative   [id number]   Narrative:  The patient returns today for follow-up. Jamie Edwards is a 44 y.o. female who was diagnosed with invasive ductal carcinoma of the right breast on 08/06/15. Right axillary lymph node biopsy was positive for metastatic mammary carcinoma. 08/12/14 Breast MRI confirmed a 4.5 cm upper outer quadrant mass along with the biopsy-proven level I right axillary lymph node with 2 level II right axillary lymph nodes.   Since consultation, she underwent neo-adjuvant Adriamycin and Cytoxan x 4 followed by weekly Taxol/Carboplatin x 4. Treatment changed to Abraxane x 8, started 09/08/14 and completed 02/03/15.  She had bilateral mastectomies on 04/13/15 because she had a PALB2 mutation. Final pathology revealed a 5.7 cm residual IDC in the right beast with negative margins. 3/8 lymph nodes were positive (only one contained a macro metastasis). Left mastectomy was benign. 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 Reports leg spasms, but Valium is helping.  Has bilateral breast drains.    Dr. Migdalia Dk will be  performing the patient's reconstruction. Expanders in place.  CT imaging in NOV 2015 for staging was negative for distant mets.   ALLERGIES:  is allergic to erythromycin base; penicillins; and zithromax.  Meds: Current Outpatient Prescriptions  Medication Sig Dispense Refill  . acetaminophen (TYLENOL) 325 MG tablet Take 2 tablets (650 mg total) by mouth every 6 (six) hours as needed for mild pain (or Temp > 100).    . ALPRAZolam (XANAX) 0.25 MG tablet Take 0.25 mg by mouth 2 (two) times daily as needed for anxiety.    . diazepam (VALIUM) 2 MG tablet Take 1 tablet (2 mg total) by mouth every 6 (six) hours as needed for muscle spasms. 30 tablet 0  . furosemide (LASIX) 20 MG tablet     . metoprolol tartrate (LOPRESSOR) 25 MG tablet TAKE 1 TABLET (25 MG TOTAL) BY MOUTH 2 (TWO) TIMES DAILY. 60 tablet 1  . OxyCODONE (OXYCONTIN) 10 mg T12A 12 hr tablet Take 1 tablet (10 mg total) by mouth every 12 (twelve) hours. 14 tablet 0  . oxyCODONE (ROXICODONE) 5 MG immediate release tablet Take 1 tablet (5 mg total) by mouth every 4 (four) hours as needed for severe pain or breakthrough pain. 40 tablet 0  . oxyCODONE-acetaminophen (ROXICET) 5-325 MG per tablet Take 1-2 tablets by mouth every 4 (four) hours as needed. 30 tablet 0  . polyethylene glycol (MIRALAX / GLYCOLAX) packet Take 17 g by mouth daily as needed for mild constipation or moderate constipation. 14 each 0  . senna (SENOKOT) 8.6 MG TABS tablet Take 1 tablet (  8.6 mg total) by mouth 2 (two) times daily. 120 each 0  . XARELTO 20 MG TABS tablet TAKE 1 TABLET (20 MG TOTAL) BY MOUTH DAILY WITH SUPPER. 30 tablet 2   No current facility-administered medications for this encounter.    Physical Findings:  weight is 202 lb 3.2 oz (91.717 kg). Her oral temperature is 98.4 F (36.9 C). Her blood pressure is 153/110 and her pulse is 103. Her respiration is 12 and oxygen saturation is 100%. .      GEN: NAD  Ambulatory    Breast exam reveals bilateral  mastectomy scars with tissue expanders that are minimally expanded, breast drains in place (draining); has limited ROM in here right upper extremity/shoulder. No obvious upper extremity lymphedema.   Lab Findings: Lab Results  Component Value Date   WBC 14.0* 04/12/2015   HGB 11.2* 04/12/2015   HCT 35.3* 04/12/2015   MCV 86.7 04/12/2015   PLT 251 04/12/2015       Radiographic Findings: Nm Sentinel Node Inj-no Rpt (breast)  04/12/2015   CLINICAL DATA: cancer right breast   Sulfur colloid was injected intradermally by the nuclear medicine  technologist for breast cancer sentinel node localization.     Impression/Plan: Recovering from surgery for locally advanced breast cancer. Undergoing tissue reexapansion. S/p chemo (neo)  We discussed adjuvant radiotherapy today.  I recommend radiotherapy to the right breast and regional notes in order to reduce risk of locoregional recurrence by 2/3.  The risks, benefits and side effects of this treatment were discussed in detail.  She understands that radiotherapy is associated with skin irritation and fatigue in the acute setting. Late effects can include cosmetic changes, reconstruction complications, and rare injury to internal organs.   She is enthusiastic about proceeding with treatment. A consent form has been signed and placed in her chart.  Will schedule for tentative simulation in ~25month. Contact Dr Migdalia Dk re: plans  Refer to PT for ROM in right arm and Lymphedema history (DVT)  _____________________________________   Eppie Gibson, MD  This document serves as a record of services personally performed by Eppie Gibson, MD. It was created on her behalf by Derek Mound, a trained medical scribe. The creation of this record is based on the scribe's personal observations and the provider's statements to them. This document has been checked and approved by the attending provider.

## 2015-05-05 ENCOUNTER — Encounter: Payer: Self-pay | Admitting: *Deleted

## 2015-05-05 NOTE — Progress Notes (Signed)
[place] Psychosocial Distress Screening Clinical Social Work  Clinical Social Work was referred by distress screening protocol.  The patient scored a 5 on the Psychosocial Distress Thermometer which indicates moderate distress. Clinical Social Worker phoned pt to assess for distress and other psychosocial needs. CSW introduced self and reviewed role of CSW and Pt and Family Support Team Members. Pt has completed surgery and stated she "was ready to be done with this cancer stuff". Pt and CSW discussed resources and programs available to assist her as she starts radiation. CSW encouraged pt to take advantage of these as radiation can seem very long at times. Pt open to counseling with CSW team and plans to meet with CSW after radiation one day. She plans to call for appt once she knows her schedule.   ONCBCN DISTRESS SCREENING 04/26/2015  Screening Type Initial Screening  Distress experienced in past week (1-10) 5  Emotional problem type Nervousness/Anxiety;Adjusting to illness;Boredom;Adjusting to appearance changes  Physical Problem type Pain;Bathing/dressing;Constipation/diarrhea  Physician notified of physical symptoms Yes  Referral to clinical psychology No  Referral to clinical social work Yes    Clinical Social Worker follow up needed: Yes.    If yes, follow up plan: See above Loren Racer, [address] Worker [place]  [facility] Phone: [phone] Fax: [phone]

## 2015-05-08 ENCOUNTER — Ambulatory Visit: Payer: Commercial Managed Care - HMO | Attending: Radiation Oncology | Admitting: Physical Therapy

## 2015-05-08 DIAGNOSIS — M25611 Stiffness of right shoulder, not elsewhere classified: Secondary | ICD-10-CM | POA: Diagnosis not present

## 2015-05-08 DIAGNOSIS — M25612 Stiffness of left shoulder, not elsewhere classified: Secondary | ICD-10-CM | POA: Diagnosis present

## 2015-05-08 DIAGNOSIS — Z9189 Other specified personal risk factors, not elsewhere classified: Secondary | ICD-10-CM | POA: Diagnosis present

## 2015-05-08 DIAGNOSIS — C50411 Malignant neoplasm of upper-outer quadrant of right female breast: Secondary | ICD-10-CM | POA: Insufficient documentation

## 2015-05-08 NOTE — Therapy (Signed)
[place], [place], [address] Phone: [phone]   Fax:  [phone]  Physical Therapy Evaluation  Patient Details  Name: Jamie Edwards MRN: [id number] Date of Birth: Oct 02, 1971 Referring Provider:  Eppie Gibson, MD  Encounter Date: 05/08/2015      PT End of Session - 05/08/15 1035    Visit Number 1   Number of Visits 13   Date for PT Re-Evaluation 06/19/15   PT Start Time 0932   PT Stop Time 1030   PT Time Calculation (min) 58 min   Activity Tolerance Patient tolerated treatment well   Behavior During Therapy [facility] for tasks assessed/performed      Past Medical History  Diagnosis Date  . Increased heart rate 11/2014    pt. put on metoprolol  . Pulmonary embolism 11/2014  . DVT (deep venous thrombosis) 11/2014    BUE  . Cancer of right breast 08/2014    S/P chemo 09/2014-02/02/2015  . Heart murmur     pt. states a doctor stated she had a slight murmur, no studies done. Present PCP has never mention it to her.    Past Surgical History  Procedure Laterality Date  . Mouth surgery      implants  . Portacath placement Right 08/31/2014    Procedure: INSERTION PORT-A-CATH WITH ULTRA SOUND GUIDENCE;  Surgeon: Jamie Luna, MD;  Location: [place];  Service: General;  Laterality: Right;  . Mastectomy complete / simple w/ sentinel node biopsy Right 04/12/2015  . Mastectomy complete / simple Left 04/12/2015    PROPHYLATIC  . Breast reconstruction with placement of tissue expander and flex hd (acellular hydrated dermis) Bilateral 04/12/2015  . Port-a-cath removal  04/12/2015  . Breast biopsy Right 07/2014  . Simple mastectomy with axillary sentinel node biopsy Bilateral 04/12/2015    Procedure: BILATERAL TOTAL MASTECTOMY WITH RIGHT AXILLARY SENTINEL LYMPH  NODE BIOPSY, POSSIBLE AXILLARY DISSECTION, LEFT PROPHYLATIC;  Surgeon: Jamie Seltzer, MD;  Location: [place];  Service: General;   Laterality: Bilateral;  . Port-a-cath removal Right 04/12/2015    Procedure: REMOVAL PORT-A-CATH;  Surgeon: Jamie Seltzer, MD;  Location: [place];  Service: General;  Laterality: Right;  . Breast reconstruction with placement of tissue expander and flex hd (acellular hydrated dermis) Bilateral 04/12/2015    Procedure: IMMEDIATE BILATERAL BREAST RECONSTRUCTION WITH PLACEMENT OF TISSUE EXPANDER AND FLEX HD (ACELLULAR HYDRATED DERMIS);  Surgeon: Jamie Kos, DO;  Location: [place];  Service: Plastics;  Laterality: Bilateral;    There were no vitals filed for this visit.  Visit Diagnosis:  Stiffness of shoulder joint, right  Stiffness of shoulder joint, left  At risk for lymphedema      Subjective Assessment - 05/08/15 0940    Subjective Pt is hoping to get her "bulbs' out this week    Pertinent History   prefers to go by the name "[name]"  bilateral mastectomy with node biopsy ( unsure how many anywhere from 8-20) on july 6 with immediate expanders . drains still intact. Has had chemo that ended april 28 and had numbness and tingling mostly in toes that has improved.  Plans to have radiatoin at the end of August.   Blood clot in lung in February of this year  She said she both arms swell when in the hospital for the blood clot.   Patient Stated Goals to improve arm mobility and strengthen both sides.    Currently in Pain? Yes  varies day by day,  now starting to get feeling back    Pain Score 4    Pain Location Axilla   Pain Orientation Right;Left   Pain Radiating Towards under incisions   Pain Onset 1 to 4 weeks ago            [facility] PT Assessment - 05/08/15 0001    Assessment   Medical Diagnosis right Triple negative breast cancer   Onset Date/Surgical Date 08/12/14   Hand Dominance Right   Precautions   Precautions Other (comment)  active right breast cancer   Restrictions   Weight Bearing Restrictions No   Balance Screen   Has the patient fallen in the past 6 months No    Has the patient had a decrease in activity level because of a fear of falling?  Yes  pt has found herself in protective posture since surgery    Is the patient reluctant to leave their home because of a fear of falling?  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  she maintains a forward rounded "protective posture"   "stretch marks" on upper arms,foreams since admit for clot   Skin Integrity no open areas    Observation/Other Assessments-Edema    Edema --  pt reports her arms have been swollen since her blood clot.    Sensation   Additional Comments pt reports she has had some numbness and tingling in toes   Coordination   Gross Motor Movements are Fluid and Coordinated Yes  slow and guarded    Posture/Postural Control   Posture/Postural Control Postural limitations   Postural Limitations Rounded Shoulders   ROM / Strength   AROM / PROM / Strength AROM   AROM   AROM Assessment Site Shoulder   Right/Left Shoulder Right;Left   Right Shoulder Extension 57 Degrees   Right Shoulder Flexion 78 Degrees   Right Shoulder ABduction 70 Degrees   Right Shoulder Internal Rotation 70 Degrees   Right Shoulder External Rotation 80 Degrees   Left  Shoulder Extension 60 Degrees   Left Shoulder Flexion 88 Degrees   Left Shoulder ABduction 82 Degrees   Left Shoulder Internal Rotation 65 Degrees   Left Shoulder External Rotation 70 Degrees   Strength   Overall Strength Comments bilateral upper extremity strength ~ 3-/5 due to pain and recent surgery            LYMPHEDEMA/ONCOLOGY QUESTIONNAIRE - 05/08/15 1008    Right Upper Extremity Lymphedema   10 cm Proximal to Olecranon Process 38.9 cm   Olecranon Process 31 cm   10 cm Proximal to Ulnar Styloid Process 24 cm   Just Proximal to Ulnar Styloid Process 18.5 cm   Across Hand at [employer] 20 cm   At [place] of 2nd Digit 6.5 cm  Left Upper Extremity Lymphedema   10 cm Proximal to Olecranon Process 36.1 cm   Olecranon Process 30 cm   10 cm Proximal to Ulnar Styloid Process 24.9 cm   Just Proximal to Ulnar Styloid Process 17.8 cm   Across Hand at [employer] 18.4 cm   At [place] of 2nd Digit 6.3 cm           Quick Dash - 05/08/15 0001    Open a tight or new jar Moderate difficulty   Do heavy household chores (wash walls, wash floors) Severe difficulty   Carry a shopping bag or briefcase Moderate difficulty   Wash your back Unable   Use a knife to cut food Moderate difficulty   Recreational activities in which you take some force or impact through your arm, shoulder, or hand (golf, hammering, tennis) Unable   During the past week, to what extent has your arm, shoulder or hand problem interfered with your normal social activities with family, friends, neighbors, or groups? Quite a bit   During the past week, to what extent has your arm, shoulder or hand problem limited your work or other regular daily activities Modererately   Arm, shoulder, or hand pain. Mild   Tingling (pins and needles) in your arm, shoulder, or hand Mild   Difficulty Sleeping Mild difficulty   DASH Score 56.82 %             [facility] Adult PT Treatment/Exercise - 05/08/15 0001     Ambulation/Gait   Gait Comments --   Self-Care   Self-Care Other Self-Care Comments   Other Self-Care Comments  applied large tg soft to upper arm and med tg soft to lower arm to see if there will be an effect on swelling of arm   Shoulder Exercises: Supine   Other Supine Exercises dowel rod exercise keeping shoulders below 90 degrees due to drains                PT Education - 05/08/15 1531    Education provided Yes   Education Details dowel rod shoulder exercise    Person(s) Educated Patient   Methods Explanation;Demonstration;Handout   Comprehension Verbalized understanding;Returned demonstration           Short Term Clinic Goals - 05/08/15 1252    CC Short Term Goal  #1   Title Patient with verbalize an understanding of lymphedema risk reduction precautions   Time 3   Period Weeks   Status New   CC Short Term Goal  #2   Title Patient will improve right  shoulder abduction to 120 degrees so that she can achieve position needed for radiation therapy.   Time 3   Period Weeks   Status New             Long Term Clinic Goals - 05/08/15 1254    CC Long Term Goal  #1   Title Patient will report a decrease in pain by 50% so they can perform daily activities with greater ease   Time 6   Period Weeks   Status New   CC Long Term Goal  #2   Title Patient will be independent in a home exercise program   Time 6   Period Weeks   Status New   CC Long Term Goal  #3   Title Patient will be know how to obtain and use compression garments for maintenance phase of treatment   Time 6   Period Weeks  Status New   CC Long Term Goal  #4   Title Patient will decrease the DASH score to < 20   to demonstrate increased functional use of upper extremity   Baseline baseline 56.82   Time 6   Period Weeks   Status New   CC Long Term Goal  #5   Title Patient will have a circumferential reduction of   1      cm at    10      cm above the             olecranon process    Baseline 38.9   Time 6   Period Weeks   Status New            Plan - 05/08/15 1532    Clinical Impression Statement Patient comes to outpatient PT after bilatera mastectomy with immediate expander placement.  She still has drainage tubes in place on both sides and has limited range of motion and strength.  She is at risk for developing lymphdema as she has had at lease 8 but possibly as many as 20 nodes removed and she has some arm swelling during a hospitalization for a PE earlier this year.  she plans to begin radiation at the end of august   Pt will benefit from skilled therapeutic intervention in order to improve on the following deficits Impaired UE functional use;Increased edema;Decreased range of motion;Decreased endurance;Decreased knowledge of precautions;Decreased activity tolerance;Decreased scar mobility;Decreased strength;Pain;Decreased knowledge of use of DME;Postural dysfunction   Rehab Potential Excellent   Clinical Impairments Affecting Rehab Potential PE earlier this year with UE swelling    PT Frequency 2x / week   PT Duration 6 weeks   PT Treatment/Interventions ADLs/Self Care Home Management;Therapeutic activities;Therapeutic exercise;Manual techniques;Taping;Manual lymph drainage;Compression bandaging;Patient/family education;Passive range of motion         Problem List Patient Active Problem List   Diagnosis Date Noted  . Breast cancer, right breast 04/12/2015  . Arm DVT (deep venous thromboembolism), acute 12/03/2014  . Antineoplastic chemotherapy induced pancytopenia 11/30/2014  . Pulmonary embolism 11/29/2014  . Bronchitis 11/25/2014  . Tachycardia 11/25/2014  . Genetic testing 10/11/2014  . Monoallelic mutation of PALB2 gene 10/11/2014  . Constipation 10/05/2014  . Breast cancer of upper-outer quadrant of right female breast 08/10/2014   Donato Heinz. Owens Shark PT   Norwood Levo 05/08/2015, 3:42 PM  [place], [place], [address] Phone: [phone]   Fax:  [phone]

## 2015-05-08 NOTE — Patient Instructions (Signed)
Cane Overhead - Supine  Hold cane at thighs with both hands, extend arms only up to ceiling for now until drains are out Hold _1-2__ seconds. Repeat __5_ times. Do _3__ times per day.   Copyright  VHI. All rights reserved.

## 2015-05-11 ENCOUNTER — Ambulatory Visit: Payer: Commercial Managed Care - HMO

## 2015-05-11 DIAGNOSIS — M25612 Stiffness of left shoulder, not elsewhere classified: Secondary | ICD-10-CM

## 2015-05-11 DIAGNOSIS — M25611 Stiffness of right shoulder, not elsewhere classified: Secondary | ICD-10-CM | POA: Diagnosis not present

## 2015-05-11 DIAGNOSIS — C50411 Malignant neoplasm of upper-outer quadrant of right female breast: Secondary | ICD-10-CM

## 2015-05-11 DIAGNOSIS — Z9189 Other specified personal risk factors, not elsewhere classified: Secondary | ICD-10-CM

## 2015-05-11 NOTE — Therapy (Signed)
[place], [place], [address] Phone: [phone]   Fax:  [phone]  Physical Therapy Treatment  Patient Details  Name: Jamie Edwards MRN: [id number] Date of Birth: 07/01/1971 Referring Provider:  Eppie Gibson, MD  Encounter Date: 05/11/2015      PT End of Session - 05/11/15 1023    Visit Number 2   Number of Visits 13   Date for PT Re-Evaluation 06/19/15   PT Start Time 0937   PT Stop Time 1020   PT Time Calculation (min) 43 min   Activity Tolerance Patient tolerated treatment well   Behavior During Therapy [facility] for tasks assessed/performed      Past Medical History  Diagnosis Date  . Increased heart rate 11/2014    pt. put on metoprolol  . Pulmonary embolism 11/2014  . DVT (deep venous thrombosis) 11/2014    BUE  . Cancer of right breast 08/2014    S/P chemo 09/2014-02/02/2015  . Heart murmur     pt. states a doctor stated she had a slight murmur, no studies done. Present PCP has never mention it to her.    Past Surgical History  Procedure Laterality Date  . Mouth surgery      implants  . Portacath placement Right 08/31/2014    Procedure: INSERTION PORT-A-CATH WITH ULTRA SOUND GUIDENCE;  Surgeon: Erroll Luna, MD;  Location: [place];  Service: General;  Laterality: Right;  . Mastectomy complete / simple w/ sentinel node biopsy Right 04/12/2015  . Mastectomy complete / simple Left 04/12/2015    PROPHYLATIC  . Breast reconstruction with placement of tissue expander and flex hd (acellular hydrated dermis) Bilateral 04/12/2015  . Port-a-cath removal  04/12/2015  . Breast biopsy Right 07/2014  . Simple mastectomy with axillary sentinel node biopsy Bilateral 04/12/2015    Procedure: BILATERAL TOTAL MASTECTOMY WITH RIGHT AXILLARY SENTINEL LYMPH  NODE BIOPSY, POSSIBLE AXILLARY DISSECTION, LEFT PROPHYLATIC;  Surgeon: Excell Seltzer, MD;  Location: [place];  Service: General;   Laterality: Bilateral;  . Port-a-cath removal Right 04/12/2015    Procedure: REMOVAL PORT-A-CATH;  Surgeon: Excell Seltzer, MD;  Location: [place];  Service: General;  Laterality: Right;  . Breast reconstruction with placement of tissue expander and flex hd (acellular hydrated dermis) Bilateral 04/12/2015    Procedure: IMMEDIATE BILATERAL BREAST RECONSTRUCTION WITH PLACEMENT OF TISSUE EXPANDER AND FLEX HD (ACELLULAR HYDRATED DERMIS);  Surgeon: Theodoro Kos, DO;  Location: [place];  Service: Plastics;  Laterality: Bilateral;    There were no vitals filed for this visit.  Visit Diagnosis:  Stiffness of shoulder joint, right  Stiffness of shoulder joint, left  At risk for lymphedema  Cancer of upper-outer quadrant of female breast, right      Subjective Assessment - 05/11/15 0942    Subjective Still have my drains in, now they think I'll be wearing them until end of the month. They are worried about the fact Im on blood thinners but still having drainage, but it is getting better.   Currently in Pain? Yes   Pain Score 3    Pain Location Other (Comment)  Rt drain site   Pain Orientation Right   Pain Descriptors / Indicators Other (Comment)  "Like someone is poking me"   Pain Onset 1 to 4 weeks ago   Aggravating Factors  The drain   Pain Relieving Factors When its removed I'll feel better  [place] Adult PT Treatment/Exercise - 05/11/15 0001    Shoulder Exercises: Pulleys   Flexion 2 minutes   Flexion Limitations Cuing to not increase pull at drain sites and not above 90 degrees   ABduction 2 minutes   ABduction Limitations Cuing to not increase pull at drain sites.and not above 90 degrees   Shoulder Exercises: ROM/Strengthening   Other ROM/Strengthening Exercises Finger Ladder for bil UE flexion and abduction 5 reps each  To 90 degrees due to drains   Manual Therapy   Manual Lymphatic Drainage (MLD) In Supine: Short neck, Lt axilla and anterior  inter-axillary anastomosis and Rt UE from dorsal to lateral shoulder instructing pt in principles of manual lymph drainage throughout.                    Short Term Clinic Goals - 05/08/15 1252    CC Short Term Goal  #1   Title Patient with verbalize an understanding of lymphedema risk reduction precautions   Time 3   Period Weeks   Status New   CC Short Term Goal  #2   Title Patient will improve right  shoulder abduction to 120 degrees so that she can achieve position needed for radiation therapy.   Time 3   Period Weeks   Status New             Long Term Clinic Goals - 05/08/15 1254    CC Long Term Goal  #1   Title Patient will report a decrease in pain by 50% so they can perform daily activities with greater ease   Time 6   Period Weeks   Status New   CC Long Term Goal  #2   Title Patient will be independent in a home exercise program   Time 6   Period Weeks   Status New   CC Long Term Goal  #3   Title Patient will be know how to obtain and use compression garments for maintenance phase of treatment   Time 6   Period Weeks   Status New   CC Long Term Goal  #4   Title Patient will decrease the DASH score to < 20   to demonstrate increased functional use of upper extremity   Baseline baseline 56.82   Time 6   Period Weeks   Status New   CC Long Term Goal  #5   Title Patient will have a circumferential reduction of   1      cm at    10      cm above the             olecranon process   Baseline 38.9   Time 6   Period Weeks   Status New            Plan - 05/11/15 1028    Clinical Impression Statement Patient tolerated treatment well with some gentle AAROM only to point of feeling drains or not above 90 degrees, and she demonstrated good initial understanding of manual lymph drainage principles.    Pt will benefit from skilled therapeutic intervention in order to improve on the following deficits Impaired UE functional use;Increased edema;Decreased  range of motion;Decreased endurance;Decreased knowledge of precautions;Decreased activity tolerance;Decreased scar mobility;Decreased strength;Pain;Decreased knowledge of use of DME;Postural dysfunction   Rehab Potential Excellent   Clinical Impairments Affecting Rehab Potential PE earlier this year with UE swelling    PT Frequency 2x / week   PT Duration 6 weeks  PT Treatment/Interventions ADLs/Self Care Home Management;Therapeutic activities;Therapeutic exercise;Manual techniques;Taping;Manual lymph drainage;Compression bandaging;Patient/family education;Passive range of motion   PT Next Visit Plan Cont and instruct manual lymph drainage to Rt UE, and gentle A/AAROM to bil UE's staying at 90 degrees due to drains and begin neck and scapular retraction AROM as well   Consulted and Agree with Plan of Care Patient        Problem List Patient Active Problem List   Diagnosis Date Noted  . Breast cancer, right breast 04/12/2015  . Arm DVT (deep venous thromboembolism), acute 12/03/2014  . Antineoplastic chemotherapy induced pancytopenia 11/30/2014  . Pulmonary embolism 11/29/2014  . Bronchitis 11/25/2014  . Tachycardia 11/25/2014  . Genetic testing 10/11/2014  . Monoallelic mutation of PALB2 gene 10/11/2014  . Constipation 10/05/2014  . Breast cancer of upper-outer quadrant of right female breast 08/10/2014    Otelia Limes, PTA 05/11/2015, 10:38 AM  [place], [place], [address] Phone: [phone]   Fax:  [phone]

## 2015-05-16 ENCOUNTER — Ambulatory Visit: Payer: Commercial Managed Care - HMO

## 2015-05-16 DIAGNOSIS — M25611 Stiffness of right shoulder, not elsewhere classified: Secondary | ICD-10-CM | POA: Diagnosis not present

## 2015-05-16 DIAGNOSIS — M25612 Stiffness of left shoulder, not elsewhere classified: Secondary | ICD-10-CM

## 2015-05-16 DIAGNOSIS — Z9189 Other specified personal risk factors, not elsewhere classified: Secondary | ICD-10-CM

## 2015-05-16 DIAGNOSIS — C50411 Malignant neoplasm of upper-outer quadrant of right female breast: Secondary | ICD-10-CM

## 2015-05-16 NOTE — Patient Instructions (Signed)

## 2015-05-16 NOTE — Therapy (Signed)
[place], [place], [address] Phone: [phone]   Fax:  [phone]  Physical Therapy Treatment  Patient Details  Name: Jamie Edwards MRN: [id number] Date of Birth: 1971-01-27 Referring Provider:  Eppie Gibson, MD  Encounter Date: 05/16/2015      PT End of Session - 05/16/15 1104    Visit Number 3   Number of Visits 13   Date for PT Re-Evaluation 06/19/15   PT Start Time 1022   PT Stop Time 1104   PT Time Calculation (min) 42 min   Activity Tolerance Patient tolerated treatment well   Behavior During Therapy [facility] for tasks assessed/performed      Past Medical History  Diagnosis Date  . Increased heart rate 11/2014    pt. put on metoprolol  . Pulmonary embolism 11/2014  . DVT (deep venous thrombosis) 11/2014    BUE  . Cancer of right breast 08/2014    S/P chemo 09/2014-02/02/2015  . Heart murmur     pt. states a doctor stated she had a slight murmur, no studies done. Present PCP has never mention it to her.    Past Surgical History  Procedure Laterality Date  . Mouth surgery      implants  . Portacath placement Right 08/31/2014    Procedure: INSERTION PORT-A-CATH WITH ULTRA SOUND GUIDENCE;  Surgeon: Erroll Luna, MD;  Location: [place];  Service: General;  Laterality: Right;  . Mastectomy complete / simple w/ sentinel node biopsy Right 04/12/2015  . Mastectomy complete / simple Left 04/12/2015    PROPHYLATIC  . Breast reconstruction with placement of tissue expander and flex hd (acellular hydrated dermis) Bilateral 04/12/2015  . Port-a-cath removal  04/12/2015  . Breast biopsy Right 07/2014  . Simple mastectomy with axillary sentinel node biopsy Bilateral 04/12/2015    Procedure: BILATERAL TOTAL MASTECTOMY WITH RIGHT AXILLARY SENTINEL LYMPH  NODE BIOPSY, POSSIBLE AXILLARY DISSECTION, LEFT PROPHYLATIC;  Surgeon: Excell Seltzer, MD;  Location: [place];  Service: General;   Laterality: Bilateral;  . Port-a-cath removal Right 04/12/2015    Procedure: REMOVAL PORT-A-CATH;  Surgeon: Excell Seltzer, MD;  Location: [place];  Service: General;  Laterality: Right;  . Breast reconstruction with placement of tissue expander and flex hd (acellular hydrated dermis) Bilateral 04/12/2015    Procedure: IMMEDIATE BILATERAL BREAST RECONSTRUCTION WITH PLACEMENT OF TISSUE EXPANDER AND FLEX HD (ACELLULAR HYDRATED DERMIS);  Surgeon: Theodoro Kos, DO;  Location: [place];  Service: Plastics;  Laterality: Bilateral;    There were no vitals filed for this visit.  Visit Diagnosis:  Stiffness of shoulder joint, right  Stiffness of shoulder joint, left  At risk for lymphedema  Cancer of upper-outer quadrant of female breast, right      Subjective Assessment - 05/16/15 1028    Subjective Have an appt after this one for them to reassess my drains. My Rt sholder just feels tight, not really having anymore pain.    Currently in Pain? No/denies               LYMPHEDEMA/ONCOLOGY QUESTIONNAIRE - 05/16/15 1037    Right Upper Extremity Lymphedema   10 cm Proximal to Olecranon Process 35.7 cm   Olecranon Process 30.4 cm   10 cm Proximal to Ulnar Styloid Process 24.3 cm   Just Proximal to Ulnar Styloid Process 18.5 cm   Across Hand at [employer] 18.6 cm   At [place] of 2nd Digit 6.6 cm  [facility] Adult PT Treatment/Exercise - 05/16/15 0001    Shoulder Exercises: Pulleys   Flexion 2 minutes  To ~90 degrees due to drains   ABduction 2 minutes  To ~90 degrees due to drains   Shoulder Exercises: ROM/Strengthening   Other ROM/Strengthening Exercises Finger Ladder for bil UE flexion and abduction 5 reps each  To 90 degrees due to drains, cuing for no shoulder hiking   Manual Therapy   Manual Lymphatic Drainage (MLD) In Supine: Short neck, Lt axilla and anterior inter-axillary anastomosis and Rt UE from dorsal to lateral shoulder instructing pt and daughter  in principles of manual lymph drainage throughout.                 PT Education - 05/16/15 1103    Education provided Yes   Education Details Self manual lymph drainage   Person(s) Educated Patient;Child(ren)  Daughter   Methods Explanation;Demonstration;Handout   Comprehension Verbalized understanding;Need further instruction           Short Term Clinic Goals - 05/08/15 1252    CC Short Term Goal  #1   Title Patient with verbalize an understanding of lymphedema risk reduction precautions   Time 3   Period Weeks   Status New   CC Short Term Goal  #2   Title Patient will improve right  shoulder abduction to 120 degrees so that she can achieve position needed for radiation therapy.   Time 3   Period Weeks   Status New             Long Term Clinic Goals - 05/16/15 1108    CC Long Term Goal  #5   Title Patient will have a circumferential reduction of   1      cm at    10      cm above the             olecranon process  >3 cm reduction today down to 35.7 cm 05/16/15   Status Achieved            Plan - 05/16/15 1106    Clinical Impression Statement Pt still with drains so we are limited on ROM activities for now. Daughter present for session and instructed her and pt in self manual lymph drainage today and issued handout. Pts circumference measurments reduced at most areas from last time measured.    Pt will benefit from skilled therapeutic intervention in order to improve on the following deficits Impaired UE functional use;Increased edema;Decreased range of motion;Decreased endurance;Decreased knowledge of precautions;Decreased activity tolerance;Decreased scar mobility;Decreased strength;Pain;Decreased knowledge of use of DME;Postural dysfunction   Rehab Potential Excellent   Clinical Impairments Affecting Rehab Potential PE earlier this year with UE swelling    PT Duration 6 weeks   PT Treatment/Interventions ADLs/Self Care Home Management;Therapeutic  activities;Therapeutic exercise;Manual techniques;Taping;Manual lymph drainage;Compression bandaging;Patient/family education;Passive range of motion   PT Next Visit Plan Cont and review manual lymph drainage to Rt UE, and gentle A/AAROM to bil UE's staying at 90 degrees due to drains and begin neck and scapular retraction AROM as well   Consulted and Agree with Plan of Care Patient        Problem List Patient Active Problem List   Diagnosis Date Noted  . Breast cancer, right breast 04/12/2015  . Arm DVT (deep venous thromboembolism), acute 12/03/2014  . Antineoplastic chemotherapy induced pancytopenia 11/30/2014  . Pulmonary embolism 11/29/2014  . Bronchitis 11/25/2014  . Tachycardia 11/25/2014  . Genetic testing 10/11/2014  .  Monoallelic mutation of PALB2 gene 10/11/2014  . Constipation 10/05/2014  . Breast cancer of upper-outer quadrant of right female breast 08/10/2014    Golda Acre 05/16/2015, 11:09 AM  [place], [place], [address] Phone: [phone]   Fax:  [phone]

## 2015-05-18 ENCOUNTER — Ambulatory Visit: Payer: Commercial Managed Care - HMO | Admitting: Physical Therapy

## 2015-05-18 DIAGNOSIS — M25612 Stiffness of left shoulder, not elsewhere classified: Secondary | ICD-10-CM

## 2015-05-18 DIAGNOSIS — Z9189 Other specified personal risk factors, not elsewhere classified: Secondary | ICD-10-CM

## 2015-05-18 DIAGNOSIS — M25611 Stiffness of right shoulder, not elsewhere classified: Secondary | ICD-10-CM

## 2015-05-18 NOTE — Therapy (Signed)
[place], [place], [address] Phone: [phone]   Fax:  [phone]  Physical Therapy Treatment  Patient Details  Name: Jamie Edwards MRN: [id number] Date of Birth: 18-May-1971 Referring Provider:  Eppie Gibson, MD  Encounter Date: 05/18/2015      PT End of Session - 05/18/15 1440    Visit Number 4   Number of Visits 13   Date for PT Re-Evaluation 06/19/15   PT Start Time 1340   PT Stop Time 1424   PT Time Calculation (min) 44 min   Activity Tolerance Patient tolerated treatment well   Behavior During Therapy [facility] for tasks assessed/performed      Past Medical History  Diagnosis Date  . Increased heart rate 11/2014    pt. put on metoprolol  . Pulmonary embolism 11/2014  . DVT (deep venous thrombosis) 11/2014    BUE  . Cancer of right breast 08/2014    S/P chemo 09/2014-02/02/2015  . Heart murmur     pt. states a doctor stated she had a slight murmur, no studies done. Present PCP has never mention it to her.    Past Surgical History  Procedure Laterality Date  . Mouth surgery      implants  . Portacath placement Right 08/31/2014    Procedure: INSERTION PORT-A-CATH WITH ULTRA SOUND GUIDENCE;  Surgeon: Erroll Luna, MD;  Location: [place];  Service: General;  Laterality: Right;  . Mastectomy complete / simple w/ sentinel node biopsy Right 04/12/2015  . Mastectomy complete / simple Left 04/12/2015    PROPHYLATIC  . Breast reconstruction with placement of tissue expander and flex hd (acellular hydrated dermis) Bilateral 04/12/2015  . Port-a-cath removal  04/12/2015  . Breast biopsy Right 07/2014  . Simple mastectomy with axillary sentinel node biopsy Bilateral 04/12/2015    Procedure: BILATERAL TOTAL MASTECTOMY WITH RIGHT AXILLARY SENTINEL LYMPH  NODE BIOPSY, POSSIBLE AXILLARY DISSECTION, LEFT PROPHYLATIC;  Surgeon: Excell Seltzer, MD;  Location: [place];  Service: General;   Laterality: Bilateral;  . Port-a-cath removal Right 04/12/2015    Procedure: REMOVAL PORT-A-CATH;  Surgeon: Excell Seltzer, MD;  Location: [place];  Service: General;  Laterality: Right;  . Breast reconstruction with placement of tissue expander and flex hd (acellular hydrated dermis) Bilateral 04/12/2015    Procedure: IMMEDIATE BILATERAL BREAST RECONSTRUCTION WITH PLACEMENT OF TISSUE EXPANDER AND FLEX HD (ACELLULAR HYDRATED DERMIS);  Surgeon: Theodoro Kos, DO;  Location: [place];  Service: Plastics;  Laterality: Bilateral;    There were no vitals filed for this visit.  Visit Diagnosis:  Stiffness of shoulder joint, right  Stiffness of shoulder joint, left  At risk for lymphedema      Subjective Assessment - 05/18/15 1337    Subjective 'got my drians out onTuesday"  She is only having a little drainage.  She stopped having fills and plans to have CT simulaion on Aug 17   Pertinent History   prefers to go by the name "[name]"  bilateral mastectomy with node biopsy ( unsure how many anywhere from 8-20) on july 6 with immediate expanders . drains still intact. Has had chemo that ended april 28 and had numbness and tingling mostly in toes that has improved.  Plans to have radiatoin at the end of August.   Blood clot in lung in February of this year  She said she both arms swell when in the hospital for the blood clot.   Patient Stated Goals to improve arm mobility  and strengthen both sides.    Currently in Pain? No/denies                         [facility] Adult PT Treatment/Exercise - 05/18/15 0001    Self-Care   Other Self-Care Comments  applied med tg soft to right  arm    Lumbar Exercises: Supine   Other Supine Lumbar Exercises lower trunk rotation for abdominal stretch    Other Supine Lumbar Exercises posterior and anterior pelvic tilts for abdominal activation    Shoulder Exercises: Supine   External Rotation --  instructed pt to rest with hand behind head, elbow supported    Other Supine Exercises dowel exercise for both arms for shoulder flexion 2 sets of 10    Other Supine Exercises attempted to do small circles with arm, but pt had pain    Shoulder Exercises: Sidelying   External Rotation AROM;Right;10 reps   ABduction AROM;Right;10 reps   ABduction Limitations from arm on hip to pointed up to ceiling, pt able to do in this position better than in sidelying    Other Sidelying Exercises incorporating breathing with arm motion    Other Sidelying Exercises 3 small circles in each direction from pointing up to ceiling    Shoulder Exercises: ROM/Strengthening   Other ROM/Strengthening Exercises lymphatic flow yoga series to gently increase range of motion    Other ROM/Strengthening Exercises shoulder shrugs and scapular retraction and protraction in supine, sitting and sidelying    Manual Therapy   Myofascial Release brief myofascial release to right pec with porlonged mild pressure with some symptomatic relief felt                PT Education - 05/18/15 1438    Education provided Yes   Education Details lymphatic yoga series , stretch in "radiation position"    Person(s) Educated Patient   Methods Explanation;Demonstration  link to [employer] video   Comprehension Verbalized understanding;Returned demonstration           Short Term Clinic Goals - 05/08/15 1252    CC Short Term Goal  #1   Title Patient with verbalize an understanding of lymphedema risk reduction precautions   Time 3   Period Weeks   Status New   CC Short Term Goal  #2   Title Patient will improve right  shoulder abduction to 120 degrees so that she can achieve position needed for radiation therapy.   Time 3   Period Weeks   Status New             Long Term Clinic Goals - 05/16/15 1108    CC Long Term Goal  #5   Title Patient will have a circumferential reduction of   1      cm at    10      cm above the             olecranon process  >3 cm reduction today down to  35.7 cm 05/16/15   Status Achieved            Plan - 05/18/15 1441    Clinical Impression Statement Did not use pulleys or wall stretched as drain sites have still been seeping a little. Assessed drain site on the right.  It has beefy red wound base and is not actively draining, although there is staining on old dressing. Used hand mirror for pt to visibly inspect the wound as well as tight  areas under expander.  Pt reported pain with range of motion today that limited her abillity to tolerate stretching , especially in supine. she tolerated it better in sidelying. Tightness in pecs perceived with some release with myofascail manual work. pt needed frequent cues to relax her shoulders and breathe as she continued to appear tense , but some relaxed at end of session    PT Next Visit Plan Assess right arm for continued use of Tg soft. Continue with active range of motion to neck, scapula and shoulder with stretching as tolerated to achieve radiation position . Add pulleys and wall stretch  if no drainage from tube sites in past several days    Consulted and Agree with Plan of Care Patient        Problem List Patient Active Problem List   Diagnosis Date Noted  . Breast cancer, right breast 04/12/2015  . Arm DVT (deep venous thromboembolism), acute 12/03/2014  . Antineoplastic chemotherapy induced pancytopenia 11/30/2014  . Pulmonary embolism 11/29/2014  . Bronchitis 11/25/2014  . Tachycardia 11/25/2014  . Genetic testing 10/11/2014  . Monoallelic mutation of PALB2 gene 10/11/2014  . Constipation 10/05/2014  . Breast cancer of upper-outer quadrant of right female breast 08/10/2014   Donato Heinz. Owens Shark, PT  05/18/2015, 2:53 PM  [place], [place], [address] Phone: [phone]   Fax:  [phone]

## 2015-05-18 NOTE — Patient Instructions (Signed)
[email or web address] Lymphatic flow series with Shoosh Lettick Crotzer 

## 2015-05-19 NOTE — Progress Notes (Signed)
Follow UP Right Breast Cancer   Past/Anticipated interventions by surgeon, if any: Dr. Migdalia Dk  To perform reconstruction, expanders in place.  Reporting pain of 4/10 over incision line. No longer has drains.   Reports site isn't draining.   Past/Anticipated interventions by medical oncology, if any: Chemotherapy : appt Dr. Lindi Adie 07/20/15  Lymphedema issues, if any: Last Physical therapy treatment 05/18/15, due again 05/22/15, @ 1100am  Pain issues, if any:  Rating 4/10- "if someone was sitting on your chest with a little knife stabbing you" mostly in the morning and when lifting or changing positions.   Current Complaints / other details:     Jamie Eaton, RN 05/19/2015,10:00 AM

## 2015-05-22 ENCOUNTER — Ambulatory Visit
Admission: RE | Admit: 2015-05-22 | Discharge: 2015-05-22 | Disposition: A | Discharge status: Home / Self Care | Payer: Commercial Managed Care - HMO | Source: Ambulatory Visit | Attending: Radiation Oncology | Admitting: Radiation Oncology

## 2015-05-22 ENCOUNTER — Encounter: Payer: Self-pay | Admitting: Radiation Oncology

## 2015-05-22 ENCOUNTER — Ambulatory Visit: Payer: Commercial Managed Care - HMO

## 2015-05-22 VITALS — BP 100/65 | HR 92 | Temp 98.5°F | Resp 12 | Wt 206.1 lb

## 2015-05-22 DIAGNOSIS — C50411 Malignant neoplasm of upper-outer quadrant of right female breast: Secondary | ICD-10-CM

## 2015-05-22 DIAGNOSIS — Z9189 Other specified personal risk factors, not elsewhere classified: Secondary | ICD-10-CM

## 2015-05-22 DIAGNOSIS — Z510 Encounter for antineoplastic radiation therapy: Secondary | ICD-10-CM | POA: Diagnosis not present

## 2015-05-22 DIAGNOSIS — M25611 Stiffness of right shoulder, not elsewhere classified: Secondary | ICD-10-CM | POA: Diagnosis not present

## 2015-05-22 DIAGNOSIS — M25612 Stiffness of left shoulder, not elsewhere classified: Secondary | ICD-10-CM

## 2015-05-22 NOTE — Progress Notes (Addendum)
Radiation Oncology         [phone] ________________________________  Name: Jamie Edwards MRN: [id number]  Date: 05/22/2015  DOB: [birth year]  Follow-Up Visit Note  Outpatient  CC: Osborne Casco, MD  Nicholas Lose, MD  Diagnosis:      ICD-9-CM ICD-10-CM   1. Breast cancer of upper-outer quadrant of right female breast 174.4 C50.411      Clinical stage IIIa (T3, N1, M0) Breast cancer of upper-outer quadrant of right female breast, IDC,Triple Negative   [id number]   Narrative:    Follow UP Right Breast Cancer     Reporting pain of 4/10 over incision line. No longer has drains.   Reports site isn't draining.   Lymphedema issues, if any: Last Physical therapy treatment 05/18/15, due again 05/22/15, @ 1100am   Per Plastic surgery note last week:  "The left expander was filled with 50 cc of sterile injectable saline under sterile technique for a total of 270/300 cc. The right expander was filled with 40 cc of sterile injectable saline for a total of 260/300cc. Both of the JP drains were removed without difficulty. She tolerated this well.  She is going for her radiation simulation next week, so we will not be able to fill further at this time. follow up in 4 weeks"  She still has limited, but improved, ROM in right shoulder. Getting PT twice weekly.     ALLERGIES:  is allergic to erythromycin base; penicillins; and zithromax.  Meds: Current Outpatient Prescriptions  Medication Sig Dispense Refill  . acetaminophen (TYLENOL) 325 MG tablet Take 2 tablets (650 mg total) by mouth every 6 (six) hours as needed for mild pain (or Temp > 100).    . ALPRAZolam (XANAX) 0.25 MG tablet Take 0.25 mg by mouth 2 (two) times daily as needed for anxiety.    . diazepam (VALIUM) 2 MG tablet Take 1 tablet (2 mg total) by mouth every 6 (six) hours as needed for muscle spasms. 30 tablet 0  . furosemide (LASIX) 20 MG tablet     . metoprolol tartrate (LOPRESSOR) 25 MG tablet TAKE 1  TABLET (25 MG TOTAL) BY MOUTH 2 (TWO) TIMES DAILY. 60 tablet 1  . OxyCODONE (OXYCONTIN) 10 mg T12A 12 hr tablet Take 1 tablet (10 mg total) by mouth every 12 (twelve) hours. 14 tablet 0  . oxyCODONE (ROXICODONE) 5 MG immediate release tablet Take 1 tablet (5 mg total) by mouth every 4 (four) hours as needed for severe pain or breakthrough pain. 40 tablet 0  . oxyCODONE-acetaminophen (ROXICET) 5-325 MG per tablet Take 1-2 tablets by mouth every 4 (four) hours as needed. 30 tablet 0  . polyethylene glycol (MIRALAX / GLYCOLAX) packet Take 17 g by mouth daily as needed for mild constipation or moderate constipation. 14 each 0  . senna (SENOKOT) 8.6 MG TABS tablet Take 1 tablet (8.6 mg total) by mouth 2 (two) times daily. 120 each 0  . XARELTO 20 MG TABS tablet TAKE 1 TABLET (20 MG TOTAL) BY MOUTH DAILY WITH SUPPER. 30 tablet 2   No current facility-administered medications for this encounter.    Physical Findings:  weight is 206 lb 1.6 oz (93.486 kg). Her oral temperature is 98.5 F (36.9 C). Her blood pressure is 100/65 and her pulse is 92. Her respiration is 12 and oxygen saturation is 100%. .      GEN: NAD  Ambulatory    Breast exam reveals bilateral mastectomy scars with tissue expanders that are expanded,  no drains in place, has limited ROM in her right upper extremity/shoulder.    Lab Findings: Lab Results  Component Value Date   WBC 14.0* 04/12/2015   HGB 11.2* 04/12/2015   HCT 35.3* 04/12/2015   MCV 86.7 04/12/2015   PLT 251 04/12/2015       Radiographic Findings: No results found.  Impression/Plan: Needs improved ROM in right UE to proceed with simulation. Will continue PT for 2 more weeks and we will move simulation to 8-29. I will let PT know to continue working towards this goal. Will notify Dr Migdalia Dk that if further expander inflation is desired, then may do so before 8-29.   _____________________________________   Eppie Gibson, MD .

## 2015-05-22 NOTE — Therapy (Signed)
[place], [place], [address] Phone: [phone]   Fax:  [phone]  Physical Therapy Treatment  Patient Details  Name: Jamie Edwards MRN: [id number] Date of Birth: Nov 25, 1970 Referring Provider:  Eppie Gibson, MD  Encounter Date: 05/22/2015      PT End of Session - 05/22/15 1159    Visit Number 5   Number of Visits 13   Date for PT Re-Evaluation 06/19/15   PT Start Time 1110   PT Stop Time 1156   PT Time Calculation (min) 46 min   Activity Tolerance Patient tolerated treatment well   Behavior During Therapy [facility] for tasks assessed/performed      Past Medical History  Diagnosis Date  . Increased heart rate 11/2014    pt. put on metoprolol  . Pulmonary embolism 11/2014  . DVT (deep venous thrombosis) 11/2014    BUE  . Cancer of right breast 08/2014    S/P chemo 09/2014-02/02/2015  . Heart murmur     pt. states a doctor stated she had a slight murmur, no studies done. Present PCP has never mention it to her.    Past Surgical History  Procedure Laterality Date  . Mouth surgery      implants  . Portacath placement Right 08/31/2014    Procedure: INSERTION PORT-A-CATH WITH ULTRA SOUND GUIDENCE;  Surgeon: Erroll Luna, MD;  Location: [place];  Service: General;  Laterality: Right;  . Mastectomy complete / simple w/ sentinel node biopsy Right 04/12/2015  . Mastectomy complete / simple Left 04/12/2015    PROPHYLATIC  . Breast reconstruction with placement of tissue expander and flex hd (acellular hydrated dermis) Bilateral 04/12/2015  . Port-a-cath removal  04/12/2015  . Breast biopsy Right 07/2014  . Simple mastectomy with axillary sentinel node biopsy Bilateral 04/12/2015    Procedure: BILATERAL TOTAL MASTECTOMY WITH RIGHT AXILLARY SENTINEL LYMPH  NODE BIOPSY, POSSIBLE AXILLARY DISSECTION, LEFT PROPHYLATIC;  Surgeon: Excell Seltzer, MD;  Location: [place];  Service: General;   Laterality: Bilateral;  . Port-a-cath removal Right 04/12/2015    Procedure: REMOVAL PORT-A-CATH;  Surgeon: Excell Seltzer, MD;  Location: [place];  Service: General;  Laterality: Right;  . Breast reconstruction with placement of tissue expander and flex hd (acellular hydrated dermis) Bilateral 04/12/2015    Procedure: IMMEDIATE BILATERAL BREAST RECONSTRUCTION WITH PLACEMENT OF TISSUE EXPANDER AND FLEX HD (ACELLULAR HYDRATED DERMIS);  Surgeon: Theodoro Kos, DO;  Location: [place];  Service: Plastics;  Laterality: Bilateral;    There were no vitals filed for this visit.  Visit Diagnosis:  Stiffness of shoulder joint, right  Stiffness of shoulder joint, left  At risk for lymphedema  Cancer of upper-outer quadrant of female breast, right      Subjective Assessment - 05/22/15 1118    Subjective Saw Dr. Isidore Moos and she wants my ROM better beofre we do the CT simulation for radiation so that appt got pushed back 2 more weeks. She wants [place] to really stretch in those next 2 sweeks. Have not had any drainage form site since being here last.    Currently in Pain? No/denies                         [facility] Adult PT Treatment/Exercise - 05/22/15 0001    Shoulder Exercises: Pulleys   Flexion 2 minutes   ABduction 2 minutes   ABduction Limitations Tactile cuing throughout to decrease shoulder compensations   Shoulder Exercises:  Therapy Ball   Flexion 5 reps   Shoulder Exercises: Stretch   Table Stretch - Flexion 5 reps  5 seconds   Table Stretch - Abduction Other (comment)  Demonstrated for pt and added to HEP   Manual Therapy   Myofascial Release To Rt inferior breast and axilla at pts c/o tightness   Passive ROM In Supine to Rt shoulder into flexion, abduction, horizontal abduction and flexion with external rotation for radiation positioning; pt required cuing throughout to relax                PT Education - 05/22/15 1158    Education provided Yes   Education Details  Wall and table slides   Person(s) Educated Patient   Methods Explanation;Demonstration;Handout   Comprehension Verbalized understanding;Returned demonstration           Short Term Clinic Goals - 05/08/15 1252    CC Short Term Goal  #1   Title Patient with verbalize an understanding of lymphedema risk reduction precautions   Time 3   Period Weeks   Status New   CC Short Term Goal  #2   Title Patient will improve right  shoulder abduction to 120 degrees so that she can achieve position needed for radiation therapy.   Time 3   Period Weeks   Status New             Long Term Clinic Goals - 05/16/15 1108    CC Long Term Goal  #5   Title Patient will have a circumferential reduction of   1      cm at    10      cm above the             olecranon process  >3 cm reduction today down to 35.7 cm 05/16/15   Status Achieved            Plan - 05/22/15 1200    Clinical Impression Statement Went back to using pulleys and other AAROM stretches today as pt reported she hadn't had any drainage since being here last and I cheched the gauze she had covering her site and it was clean with no drainage.  Pt toleratedt he stretches today well, with most c/o tightness being at inferior to Rt breast area and some at Rt axilla. Pt responded well to deep breathing during stretches to relax muscles when at end ranges. Dr. Lanell Persons held off pts CT cimulation for 2 weeks to see if pt can get more ROM for radiation positioning.   Pt will benefit from skilled therapeutic intervention in order to improve on the following deficits Impaired UE functional use;Increased edema;Decreased range of motion;Decreased endurance;Decreased knowledge of precautions;Decreased activity tolerance;Decreased scar mobility;Decreased strength;Pain;Decreased knowledge of use of DME;Postural dysfunction   Rehab Potential Excellent   Clinical Impairments Affecting Rehab Potential PE earlier this year with UE swelling    PT  Frequency 2x / week   PT Duration 6 weeks   PT Treatment/Interventions ADLs/Self Care Home Management;Therapeutic activities;Therapeutic exercise;Manual techniques;Taping;Manual lymph drainage;Compression bandaging;Patient/family education;Passive range of motion   PT Next Visit Plan Assess next visit. Continue with active range of motion to neck, scapula and shoulder with stretching as tolerated to achieve radiation position.   Consulted and Agree with Plan of Care Patient        Problem List Patient Active Problem List   Diagnosis Date Noted  . Breast cancer, right breast 04/12/2015  . Arm DVT (deep venous thromboembolism), acute 12/03/2014  .  Antineoplastic chemotherapy induced pancytopenia 11/30/2014  . Pulmonary embolism 11/29/2014  . Bronchitis 11/25/2014  . Tachycardia 11/25/2014  . Genetic testing 10/11/2014  . Monoallelic mutation of PALB2 gene 10/11/2014  . Constipation 10/05/2014  . Breast cancer of upper-outer quadrant of right female breast 08/10/2014    Otelia Limes, PTA 05/22/2015, 12:19 PM  [place], [place], [address] Phone: [phone]   Fax:  [phone]

## 2015-05-22 NOTE — Patient Instructions (Signed)
Walk Up Exercise (Active/Assistive)   With elbow straight, use fingers to "crawl" up wall or door frame as far as possible. Hold _5-8___ seconds. Repeat __5__ times. Do _3-4___ sessions per day. Also do out to side facing away from the wall.  Sitting at dining room table face table and slide arm forward until gentle stretch felt keeping shoulder down. Then sit facing away from table with arm out to side and slide arm away from you until gentle stretch felt keeping shoulder down.  For both hold 5-8 seconds, 5-8 reps do 3-4 times a day AS TOLERATED.   Copyright  VHI. All rights reserved.  Flexion (Assistive)   Clasp hands together and raise arms above head, keeping elbows as straight as possible. Can be done sitting or lying. Repeat __10__ times. Do __2-3__ sessions per day.  Copyright  VHI. All rights reserved.

## 2015-05-23 ENCOUNTER — Ambulatory Visit: Payer: Commercial Managed Care - HMO | Admitting: Physical Therapy

## 2015-05-23 DIAGNOSIS — M25611 Stiffness of right shoulder, not elsewhere classified: Secondary | ICD-10-CM | POA: Diagnosis not present

## 2015-05-23 DIAGNOSIS — Z9189 Other specified personal risk factors, not elsewhere classified: Secondary | ICD-10-CM

## 2015-05-23 DIAGNOSIS — M25612 Stiffness of left shoulder, not elsewhere classified: Secondary | ICD-10-CM

## 2015-05-23 NOTE — Therapy (Signed)
[place], [place], [address] Phone: [phone]   Fax:  [phone]  Physical Therapy Treatment  Patient Details  Name: Jamie Edwards MRN: [id number] Date of Birth: Jan 02, 1971 Referring Provider:  Eppie Gibson, MD  Encounter Date: 05/23/2015      PT End of Session - 05/23/15 1755    Visit Number 6   Number of Visits 13   Date for PT Re-Evaluation 06/19/15   PT Start Time 1300   PT Stop Time 4008   PT Time Calculation (min) 47 min   Activity Tolerance Patient limited by pain   Behavior During Therapy [facility] for tasks assessed/performed      Past Medical History  Diagnosis Date  . Increased heart rate 11/2014    pt. put on metoprolol  . Pulmonary embolism 11/2014  . DVT (deep venous thrombosis) 11/2014    BUE  . Cancer of right breast 08/2014    S/P chemo 09/2014-02/02/2015  . Heart murmur     pt. states a doctor stated she had a slight murmur, no studies done. Present PCP has never mention it to her.    Past Surgical History  Procedure Laterality Date  . Mouth surgery      implants  . Portacath placement Right 08/31/2014    Procedure: INSERTION PORT-A-CATH WITH ULTRA SOUND GUIDENCE;  Surgeon: Erroll Luna, MD;  Location: [place];  Service: General;  Laterality: Right;  . Mastectomy complete / simple w/ sentinel node biopsy Right 04/12/2015  . Mastectomy complete / simple Left 04/12/2015    PROPHYLATIC  . Breast reconstruction with placement of tissue expander and flex hd (acellular hydrated dermis) Bilateral 04/12/2015  . Port-a-cath removal  04/12/2015  . Breast biopsy Right 07/2014  . Simple mastectomy with axillary sentinel node biopsy Bilateral 04/12/2015    Procedure: BILATERAL TOTAL MASTECTOMY WITH RIGHT AXILLARY SENTINEL LYMPH  NODE BIOPSY, POSSIBLE AXILLARY DISSECTION, LEFT PROPHYLATIC;  Surgeon: Excell Seltzer, MD;  Location: [place];  Service: General;  Laterality:  Bilateral;  . Port-a-cath removal Right 04/12/2015    Procedure: REMOVAL PORT-A-CATH;  Surgeon: Excell Seltzer, MD;  Location: [place];  Service: General;  Laterality: Right;  . Breast reconstruction with placement of tissue expander and flex hd (acellular hydrated dermis) Bilateral 04/12/2015    Procedure: IMMEDIATE BILATERAL BREAST RECONSTRUCTION WITH PLACEMENT OF TISSUE EXPANDER AND FLEX HD (ACELLULAR HYDRATED DERMIS);  Surgeon: Theodoro Kos, DO;  Location: [place];  Service: Plastics;  Laterality: Bilateral;    There were no vitals filed for this visit.  Visit Diagnosis:  Stiffness of shoulder joint, right  Stiffness of shoulder joint, left  At risk for lymphedema      Subjective Assessment - 05/23/15 1304    Subjective Nothing new since last visit.   Currently in Pain? Yes   Pain Score 5    Pain Location Axilla   Pain Orientation Right   Pain Descriptors / Indicators Other (Comment);Tightness  " a little bit   Aggravating Factors  stretching   Pain Relieving Factors pressing on incision area under the chest            [facility] PT Assessment - 05/23/15 0001    AROM   Right Shoulder Flexion 97 Degrees   Right Shoulder ABduction 87 Degrees   Left Shoulder Flexion 123 Degrees  approx.   Left Shoulder ABduction 111 Degrees  [facility] Adult PT Treatment/Exercise - 05/23/15 0001    Shoulder Exercises: Pulleys   Flexion 2 minutes   ABduction 2 minutes   Shoulder Exercises: Therapy Ball   Flexion Other (comment)  7 reps   Shoulder Exercises: ROM/Strengthening   Other ROM/Strengthening Exercises finger ladder for abduction x 5 each   Manual Therapy   Manual Therapy Edema management   Edema Management cut, fitted, and issued TG soft small stockinette for right arm   Myofascial Release right UE myofascial pulling, gently, in supine only with limited ROM into abduction   Passive ROM Right shoulder into IR, ER, abduction, and flexion to patient  tolerance                PT Education - 05/22/15 1158    Education provided Yes   Education Details Wall and table slides   Person(s) Educated Patient   Methods Explanation;Demonstration;Handout   Comprehension Verbalized understanding;Returned demonstration           Short Term Clinic Goals - 05/23/15 1758    CC Short Term Goal  #2   Title Patient will improve right  shoulder abduction to 120 degrees so that she can achieve position needed for radiation therapy.   Baseline 87 degrees actively (up from 70 at eval) on 05/23/15   Status On-going             [place] [facility] Goals - 05/16/15 1108    CC Long Term Goal  #5   Title Patient will have a circumferential reduction of   1      cm at    10      cm above the             olecranon process  >3 cm reduction today down to 35.7 cm 05/16/15   Status Achieved            Plan - 05/23/15 1755    Clinical Impression Statement Patient with nice improvements in bilateral shoulder AROM but still with significantly limited ROM with pain limiting her.  Will try to see her 3x/week to get her ready for radiation therapy.   Pt will benefit from skilled therapeutic intervention in order to improve on the following deficits Impaired UE functional use;Increased edema;Decreased range of motion;Decreased endurance;Decreased knowledge of precautions;Decreased activity tolerance;Decreased scar mobility;Decreased strength;Pain;Decreased knowledge of use of DME;Postural dysfunction   Rehab Potential Excellent   Clinical Impairments Affecting Rehab Potential PE earlier this year with UE swelling    PT Frequency 3x / week   PT Duration 6 weeks   PT Treatment/Interventions Therapeutic exercise;Manual techniques;Passive range of motion   PT Next Visit Plan Continue focus on shoulder ROM in preparation for XRT; try to see 3x/week.   Consulted and Agree with Plan of Care Patient        Problem List Patient Active Problem List    Diagnosis Date Noted  . Breast cancer, right breast 04/12/2015  . Arm DVT (deep venous thromboembolism), acute 12/03/2014  . Antineoplastic chemotherapy induced pancytopenia 11/30/2014  . Pulmonary embolism 11/29/2014  . Bronchitis 11/25/2014  . Tachycardia 11/25/2014  . Genetic testing 10/11/2014  . Monoallelic mutation of PALB2 gene 10/11/2014  . Constipation 10/05/2014  . Breast cancer of upper-outer quadrant of right female breast 08/10/2014    SALISBURY,DONNA 05/23/2015, 5:59 PM  [place], [place], [address] Phone: [phone]   Fax:  [place], PT 05/23/2015  5:59 PM

## 2015-05-24 ENCOUNTER — Ambulatory Visit: Payer: Commercial Managed Care - HMO | Admitting: Radiation Oncology

## 2015-05-25 ENCOUNTER — Encounter: Payer: 59 | Admitting: Physical Therapy

## 2015-05-26 ENCOUNTER — Ambulatory Visit: Payer: Commercial Managed Care - HMO | Admitting: Physical Therapy

## 2015-05-26 ENCOUNTER — Encounter: Payer: Self-pay | Admitting: *Deleted

## 2015-05-26 ENCOUNTER — Other Ambulatory Visit: Payer: Self-pay | Admitting: Hematology and Oncology

## 2015-05-26 DIAGNOSIS — C50411 Malignant neoplasm of upper-outer quadrant of right female breast: Secondary | ICD-10-CM

## 2015-05-26 DIAGNOSIS — M25611 Stiffness of right shoulder, not elsewhere classified: Secondary | ICD-10-CM | POA: Diagnosis not present

## 2015-05-26 DIAGNOSIS — Z9189 Other specified personal risk factors, not elsewhere classified: Secondary | ICD-10-CM

## 2015-05-26 DIAGNOSIS — M25612 Stiffness of left shoulder, not elsewhere classified: Secondary | ICD-10-CM

## 2015-05-26 NOTE — Progress Notes (Signed)
[place] Work  Clinical Social Work was referred by patient for assessment of psychosocial needs.  Clinical Social Worker contacted patient at home to offer support and assess for needs.  CSW and pt have had exchanged several messages back and forth. CSW and pt connected today and pt has many questions about transportation resources and possible financial help as well. CSW educated on options for assistance, ACS and [employer] gas cards during radiation. Pt plans to meet with CSW on 06/01/15 to further explore resources and complete full assessment. Pt appears to not meet income guidelines for grant, so options may be limited.   Clinical Social Work interventions: Resource education  Loren Racer, [place] Worker [place]  [place] Phone: [phone] Fax: [phone]

## 2015-05-26 NOTE — Therapy (Signed)
[place], [place], [address] Phone: [phone]   Fax:  [phone]  Physical Therapy Treatment  Patient Details  Name: Jamie Edwards MRN: [id number] Date of Birth: 1971-09-07 Referring Provider:  Kelton Pillar, MD  Encounter Date: 05/26/2015      PT End of Session - 05/26/15 1212    Visit Number 7   Number of Visits 13   Date for PT Re-Evaluation 06/19/15   PT Start Time 1010   PT Stop Time 1106   PT Time Calculation (min) 56 min   Activity Tolerance Patient limited by pain   Behavior During Therapy [facility] for tasks assessed/performed      Past Medical History  Diagnosis Date  . Increased heart rate 11/2014    pt. put on metoprolol  . Pulmonary embolism 11/2014  . DVT (deep venous thrombosis) 11/2014    BUE  . Cancer of right breast 08/2014    S/P chemo 09/2014-02/02/2015  . Heart murmur     pt. states a doctor stated she had a slight murmur, no studies done. Present PCP has never mention it to her.    Past Surgical History  Procedure Laterality Date  . Mouth surgery      implants  . Portacath placement Right 08/31/2014    Procedure: INSERTION PORT-A-CATH WITH ULTRA SOUND GUIDENCE;  Surgeon: Erroll Luna, MD;  Location: [place];  Service: General;  Laterality: Right;  . Mastectomy complete / simple w/ sentinel node biopsy Right 04/12/2015  . Mastectomy complete / simple Left 04/12/2015    PROPHYLATIC  . Breast reconstruction with placement of tissue expander and flex hd (acellular hydrated dermis) Bilateral 04/12/2015  . Port-a-cath removal  04/12/2015  . Breast biopsy Right 07/2014  . Simple mastectomy with axillary sentinel node biopsy Bilateral 04/12/2015    Procedure: BILATERAL TOTAL MASTECTOMY WITH RIGHT AXILLARY SENTINEL LYMPH  NODE BIOPSY, POSSIBLE AXILLARY DISSECTION, LEFT PROPHYLATIC;  Surgeon: Excell Seltzer, MD;  Location: [place];  Service: General;   Laterality: Bilateral;  . Port-a-cath removal Right 04/12/2015    Procedure: REMOVAL PORT-A-CATH;  Surgeon: Excell Seltzer, MD;  Location: [place];  Service: General;  Laterality: Right;  . Breast reconstruction with placement of tissue expander and flex hd (acellular hydrated dermis) Bilateral 04/12/2015    Procedure: IMMEDIATE BILATERAL BREAST RECONSTRUCTION WITH PLACEMENT OF TISSUE EXPANDER AND FLEX HD (ACELLULAR HYDRATED DERMIS);  Surgeon: Theodoro Kos, DO;  Location: [place];  Service: Plastics;  Laterality: Bilateral;    There were no vitals filed for this visit.  Visit Diagnosis:  Stiffness of shoulder joint, right  Stiffness of shoulder joint, left  At risk for lymphedema      Subjective Assessment - 05/26/15 1012    Subjective Can we go easy on things today?  Having some soreness.   Currently in Pain? Yes   Pain Score 4    Pain Location Arm   Pain Orientation Right;Upper;Medial   Pain Descriptors / Indicators Sore   Pain Type Acute pain   Aggravating Factors  doesn't know   Pain Relieving Factors pain meds                         [facility] Adult PT Treatment/Exercise - 05/26/15 0001    Shoulder Exercises: Sidelying   External Rotation AROM;Right;10 reps  did well with this   Shoulder Exercises: Pulleys   Flexion 2 minutes   ABduction 2 minutes   Shoulder  Exercises: Therapy Ball   Flexion Other (comment)  7 reps   Shoulder Exercises: ROM/Strengthening   Other ROM/Strengthening Exercises finger ladder for abduction x 5 each   Manual Therapy   Manual Therapy Soft tissue mobilization;Scapular mobilization   Edema Management cut, fitted, and issued TG soft small stockinette for right arm   Soft tissue mobilization very gently, inferior to right breast   Myofascial Release right UE myofascial pulling, gently, in supine only with limited ROM into abduction   Scapular Mobilization right, in left sidelying, gentle and rhythmic   Passive ROM Right shoulder into  IR, ER, abduction, and flexion to patient tolerance                   Short Term Clinic Goals - 05/23/15 1758    CC Short Term Goal  #2   Title Patient will improve right  shoulder abduction to 120 degrees so that she can achieve position needed for radiation therapy.   Baseline 87 degrees actively (up from 70 at eval) on 05/23/15   Status On-going             [place] [facility] Goals - 05/16/15 1108    CC Long Term Goal  #5   Title Patient will have a circumferential reduction of   1      cm at    10      cm above the             olecranon process  >3 cm reduction today down to 35.7 cm 05/16/15   Status Achieved            Plan - 05/26/15 1212    Clinical Impression Statement Patient does seem to have a lot of muscle tension when therapist is using manual techniqes to increase ROM; she had difficulty relaxing even for gentle right scapular mobilizations, which is a comfortable technique (and patient said it felt good).  ROM remains limited due to her experience of pain with stretching.   Pt will benefit from skilled therapeutic intervention in order to improve on the following deficits Impaired UE functional use;Increased edema;Decreased range of motion;Decreased endurance;Decreased knowledge of precautions;Decreased activity tolerance;Decreased scar mobility;Decreased strength;Pain;Decreased knowledge of use of DME;Postural dysfunction   Rehab Potential Excellent   PT Frequency 3x / week   PT Duration 6 weeks   PT Treatment/Interventions Therapeutic exercise;Manual techniques;Passive range of motion   PT Next Visit Plan Continue focus on shoulder ROM in preparation for XRT; try to see 3x/week.   Consulted and Agree with Plan of Care Patient        Problem List Patient Active Problem List   Diagnosis Date Noted  . Breast cancer, right breast 04/12/2015  . Arm DVT (deep venous thromboembolism), acute 12/03/2014  . Antineoplastic chemotherapy induced pancytopenia  11/30/2014  . Pulmonary embolism 11/29/2014  . Bronchitis 11/25/2014  . Tachycardia 11/25/2014  . Genetic testing 10/11/2014  . Monoallelic mutation of PALB2 gene 10/11/2014  . Constipation 10/05/2014  . Breast cancer of upper-outer quadrant of right female breast 08/10/2014    SALISBURY,DONNA 05/26/2015, 12:16 PM  [place], [place], [address] Phone: [phone]   Fax:  [place], PT 05/26/2015 12:16 PM

## 2015-05-29 ENCOUNTER — Ambulatory Visit: Payer: Commercial Managed Care - HMO

## 2015-05-29 DIAGNOSIS — Z9189 Other specified personal risk factors, not elsewhere classified: Secondary | ICD-10-CM

## 2015-05-29 DIAGNOSIS — M25611 Stiffness of right shoulder, not elsewhere classified: Secondary | ICD-10-CM

## 2015-05-29 DIAGNOSIS — C50411 Malignant neoplasm of upper-outer quadrant of right female breast: Secondary | ICD-10-CM

## 2015-05-29 DIAGNOSIS — M25612 Stiffness of left shoulder, not elsewhere classified: Secondary | ICD-10-CM

## 2015-05-29 NOTE — Therapy (Signed)
[place], [place], [address] Phone: [phone]   Fax:  [phone]  Physical Therapy Treatment  Patient Details  Name: Jamie Edwards MRN: [id number] Date of Birth: 1971-03-17 Referring Provider:  Eppie Gibson, MD  Encounter Date: 05/29/2015      PT End of Session - 05/29/15 1158    Visit Number 8   Number of Visits 13   Date for PT Re-Evaluation 06/19/15   PT Start Time 1105   PT Stop Time 1151   PT Time Calculation (min) 46 min   Activity Tolerance Patient limited by pain   Behavior During Therapy [facility] for tasks assessed/performed      Past Medical History  Diagnosis Date  . Increased heart rate 11/2014    pt. put on metoprolol  . Pulmonary embolism 11/2014  . DVT (deep venous thrombosis) 11/2014    BUE  . Cancer of right breast 08/2014    S/P chemo 09/2014-02/02/2015  . Heart murmur     pt. states a doctor stated she had a slight murmur, no studies done. Present PCP has never mention it to her.    Past Surgical History  Procedure Laterality Date  . Mouth surgery      implants  . Portacath placement Right 08/31/2014    Procedure: INSERTION PORT-A-CATH WITH ULTRA SOUND GUIDENCE;  Surgeon: Erroll Luna, MD;  Location: [facility];  Service: General;  Laterality: Right;  . Mastectomy complete / simple w/ sentinel node biopsy Right 04/12/2015  . Mastectomy complete / simple Left 04/12/2015    PROPHYLATIC  . Breast reconstruction with placement of tissue expander and flex hd (acellular hydrated dermis) Bilateral 04/12/2015  . Port-a-cath removal  04/12/2015  . Breast biopsy Right 07/2014  . Simple mastectomy with axillary sentinel node biopsy Bilateral 04/12/2015    Procedure: BILATERAL TOTAL MASTECTOMY WITH RIGHT AXILLARY SENTINEL LYMPH  NODE BIOPSY, POSSIBLE AXILLARY DISSECTION, LEFT PROPHYLATIC;  Surgeon: Excell Seltzer, MD;  Location: [facility];  Service: General;  Laterality:  Bilateral;  . Port-a-cath removal Right 04/12/2015    Procedure: REMOVAL PORT-A-CATH;  Surgeon: Excell Seltzer, MD;  Location: [facility];  Service: General;  Laterality: Right;  . Breast reconstruction with placement of tissue expander and flex hd (acellular hydrated dermis) Bilateral 04/12/2015    Procedure: IMMEDIATE BILATERAL BREAST RECONSTRUCTION WITH PLACEMENT OF TISSUE EXPANDER AND FLEX HD (ACELLULAR HYDRATED DERMIS);  Surgeon: Theodoro Kos, DO;  Location: [facility];  Service: Plastics;  Laterality: Bilateral;    There were no vitals filed for this visit.  Visit Diagnosis:  Stiffness of shoulder joint, right  Stiffness of shoulder joint, left  At risk for lymphedema  Cancer of upper-outer quadrant of female breast, right      Subjective Assessment - 05/29/15 1110    Subjective Still feeling very sore from last treatment and doing my exercises over the weekend, but I feel like we are getting somewhere.    Currently in Pain? Yes   Pain Score 5    Pain Location Arm   Pain Orientation Medial;Upper;Right   Pain Descriptors / Indicators Aching;Sore   Aggravating Factors  just having to stretch more is keeping it sore   Pain Relieving Factors nothing right now, pain meds didn't help like I was hoping                         [facility] Adult PT Treatment/Exercise - 05/29/15 0001    Shoulder  Exercises: Pulleys   Flexion 2 minutes   ABduction 2 minutes   ABduction Limitations Scaption   Shoulder Exercises: Therapy Ball   Flexion 10 reps   Flexion Limitations Tactile cuing for technique throughout   Shoulder Exercises: ROM/Strengthening   Other ROM/Strengthening Exercises Standing wiht back against wall for bil UE abduction to tolerance 5 reps, 5 second holds   Manual Therapy   Soft tissue mobilization very gently, inferior to right breast   Myofascial Release right UE myofascial pulling, gently, in supine only with limited ROM into abduction   Passive ROM Right shoulder  into IR, ER, abduction, and flexion to patient tolerance, contract/relax for abduction, and then horizontal abduction with UE in radiation position as much as pt could tolerate. Did each one 1 set of 3x 7 sec contract/10 sec holds, increase noticed after each.                   Short Term Clinic Goals - 05/23/15 1758    CC Short Term Goal  #2   Title Patient will improve right  shoulder abduction to 120 degrees so that she can achieve position needed for radiation therapy.   Baseline 87 degrees actively (up from 70 at eval) on 05/23/15   Status On-going             [facility] Goals - 05/16/15 1108    CC Long Term Goal  #5   Title Patient will have a circumferential reduction of   1      cm at    10      cm above the             olecranon process  >3 cm reduction today down to 35.7 cm 05/16/15   Status Achieved            Plan - 05/29/15 1159    Clinical Impression Statement Pt tolerated PROM a little better today with relaxing throughout, though still with some muscle tension. She was doing well with following verbal cuing to relax most times when therapist noticed muscle tension increasing. Pt tolerated contract/relax fairly well as long as we stayed within tolerable position and increases noted each time.    Pt will benefit from skilled therapeutic intervention in order to improve on the following deficits Impaired UE functional use;Increased edema;Decreased range of motion;Decreased endurance;Decreased knowledge of precautions;Decreased activity tolerance;Decreased scar mobility;Decreased strength;Pain;Decreased knowledge of use of DME;Postural dysfunction   Rehab Potential Excellent   Clinical Impairments Affecting Rehab Potential PE earlier this year with UE swelling    PT Frequency 3x / week   PT Duration 6 weeks   PT Treatment/Interventions Therapeutic exercise;Manual techniques;Passive range of motion   PT Next Visit Plan Continue focus on shoulder ROM in  preparation for XRT/ cont contract/relax with PROM; try to see 3x/week.   Consulted and Agree with Plan of Care Patient        Problem List Patient Active Problem List   Diagnosis Date Noted  . Breast cancer, right breast 04/12/2015  . Arm DVT (deep venous thromboembolism), acute 12/03/2014  . Antineoplastic chemotherapy induced pancytopenia 11/30/2014  . Pulmonary embolism 11/29/2014  . Bronchitis 11/25/2014  . Tachycardia 11/25/2014  . Genetic testing 10/11/2014  . Monoallelic mutation of PALB2 gene 10/11/2014  . Constipation 10/05/2014  . Breast cancer of upper-outer quadrant of right female breast 08/10/2014    Otelia Limes, PTA 05/29/2015, 12:03 PM  [place]  [place], [place], [address] Phone: [phone]   Fax:  [phone]

## 2015-05-31 ENCOUNTER — Ambulatory Visit: Payer: Commercial Managed Care - HMO | Admitting: Physical Therapy

## 2015-05-31 DIAGNOSIS — M25612 Stiffness of left shoulder, not elsewhere classified: Secondary | ICD-10-CM

## 2015-05-31 DIAGNOSIS — M25611 Stiffness of right shoulder, not elsewhere classified: Secondary | ICD-10-CM

## 2015-05-31 NOTE — Therapy (Signed)
[place], [place], [address] Phone: [phone]   Fax:  [phone]  Physical Therapy Treatment  Patient Details  Name: Jamie Edwards MRN: [id number] Date of Birth: 1971-07-10 Referring Provider:  Kelton Pillar, MD  Encounter Date: 05/31/2015      PT End of Session - 05/31/15 1525    Visit Number 9   Number of Visits 13   Date for PT Re-Evaluation 06/19/15   PT Start Time 4496   PT Stop Time 1516   PT Time Calculation (min) 41 min   Activity Tolerance Patient limited by pain   Behavior During Therapy [facility] for tasks assessed/performed      Past Medical History  Diagnosis Date  . Increased heart rate 11/2014    pt. put on metoprolol  . Pulmonary embolism 11/2014  . DVT (deep venous thrombosis) 11/2014    BUE  . Cancer of right breast 08/2014    S/P chemo 09/2014-02/02/2015  . Heart murmur     pt. states a doctor stated she had a slight murmur, no studies done. Present PCP has never mention it to her.    Past Surgical History  Procedure Laterality Date  . Mouth surgery      implants  . Portacath placement Right 08/31/2014    Procedure: INSERTION PORT-A-CATH WITH ULTRA SOUND GUIDENCE;  Surgeon: Erroll Luna, MD;  Location: [facility];  Service: General;  Laterality: Right;  . Mastectomy complete / simple w/ sentinel node biopsy Right 04/12/2015  . Mastectomy complete / simple Left 04/12/2015    PROPHYLATIC  . Breast reconstruction with placement of tissue expander and flex hd (acellular hydrated dermis) Bilateral 04/12/2015  . Port-a-cath removal  04/12/2015  . Breast biopsy Right 07/2014  . Simple mastectomy with axillary sentinel node biopsy Bilateral 04/12/2015    Procedure: BILATERAL TOTAL MASTECTOMY WITH RIGHT AXILLARY SENTINEL LYMPH  NODE BIOPSY, POSSIBLE AXILLARY DISSECTION, LEFT PROPHYLATIC;  Surgeon: Excell Seltzer, MD;  Location: [facility];  Service: General;   Laterality: Bilateral;  . Port-a-cath removal Right 04/12/2015    Procedure: REMOVAL PORT-A-CATH;  Surgeon: Excell Seltzer, MD;  Location: [facility];  Service: General;  Laterality: Right;  . Breast reconstruction with placement of tissue expander and flex hd (acellular hydrated dermis) Bilateral 04/12/2015    Procedure: IMMEDIATE BILATERAL BREAST RECONSTRUCTION WITH PLACEMENT OF TISSUE EXPANDER AND FLEX HD (ACELLULAR HYDRATED DERMIS);  Surgeon: Theodoro Kos, DO;  Location: [facility];  Service: Plastics;  Laterality: Bilateral;    There were no vitals filed for this visit.  Visit Diagnosis:  Stiffness of shoulder joint, right  Stiffness of shoulder joint, left      Subjective Assessment - 05/31/15 1437    Subjective A little sore after last time, but not too bad.  Feel like I'm getting a little more flexible.   Currently in Pain? Yes   Pain Score 5    Pain Location Arm  and right chest   Pain Orientation Right;Upper;Medial   Pain Descriptors / Indicators Tightness;Aching   Aggravating Factors  therapy, expander?   Pain Relieving Factors in the morning, get moving around                         [facility] Adult PT Treatment/Exercise - 05/31/15 0001    Shoulder Exercises: Pulleys   Flexion 3 minutes   ABduction 3 minutes   Shoulder Exercises: Therapy Ball   Flexion 10 reps   Shoulder  Exercises: ROM/Strengthening   Other ROM/Strengthening Exercises finger ladder for abduction x 5 each   Other ROM/Strengthening Exercises stand facing wall, right hand on wall at 3:00 position, turn body slightly to left to feel chest stretch, 30 seconds   Shoulder Exercises: Stretch   Other Shoulder Stretches bilat. active shoulder rolls backwards   Manual Therapy   Soft tissue mobilization very gently, inferior to right breast   Myofascial Release right UE myofascial pulling, gently, in supine only with limited ROM into abduction   Manual Lymphatic Drainage (MLD)     Passive ROM  Contract-relax for right shoulder ER, abduction, and flexion; PROM with stretch to tolerance same motions; these techniques also with right arm toward radiation positioning.                   Short Term Clinic Goals - 05/31/15 1526    CC Short Term Goal  #1   Title Patient with verbalize an understanding of lymphedema risk reduction precautions   Status On-going   CC Short Term Goal  #2   Title Patient will improve right  shoulder abduction to 120 degrees so that she can achieve position needed for radiation therapy.   Status On-going             Long Term Clinic Goals - 05/16/15 1108    CC Long Term Goal  #5   Title Patient will have a circumferential reduction of   1      cm at    10      cm above the             olecranon process  >3 cm reduction today down to 35.7 cm 05/16/15   Status Achieved            Plan - 05/31/15 1525    Clinical Impression Statement Responded well to contract relax for right shoulder external rotation and flexion, less responsive for abduction.  Still lacking enough motion for radiation treatment positioning.   Pt will benefit from skilled therapeutic intervention in order to improve on the following deficits Impaired UE functional use;Increased edema;Decreased range of motion;Decreased endurance;Decreased knowledge of precautions;Decreased activity tolerance;Decreased scar mobility;Decreased strength;Pain;Decreased knowledge of use of DME;Postural dysfunction   Rehab Potential Excellent   Clinical Impairments Affecting Rehab Potential PE earlier this year with UE swelling    PT Frequency 3x / week   PT Duration 6 weeks   PT Treatment/Interventions Therapeutic exercise;Manual techniques;Passive range of motion   PT Next Visit Plan Continue focus on shoulder ROM in preparation for XRT/ cont contract/relax with PROM; try to see 3x/week.   Consulted and Agree with Plan of Care Patient        Problem List Patient Active Problem List    Diagnosis Date Noted  . Breast cancer, right breast 04/12/2015  . Arm DVT (deep venous thromboembolism), acute 12/03/2014  . Antineoplastic chemotherapy induced pancytopenia 11/30/2014  . Pulmonary embolism 11/29/2014  . Bronchitis 11/25/2014  . Tachycardia 11/25/2014  . Genetic testing 10/11/2014  . Monoallelic mutation of PALB2 gene 10/11/2014  . Constipation 10/05/2014  . Breast cancer of upper-outer quadrant of right female breast 08/10/2014    Espiridion Supinski 05/31/2015, 9:34 PM  [place], [place], [address] Phone: [phone]   Fax:  [place], PT 05/31/2015 9:34 PM

## 2015-06-01 ENCOUNTER — Ambulatory Visit: Payer: Commercial Managed Care - HMO | Admitting: Physical Therapy

## 2015-06-01 ENCOUNTER — Encounter: Payer: Self-pay | Admitting: *Deleted

## 2015-06-01 DIAGNOSIS — M25612 Stiffness of left shoulder, not elsewhere classified: Secondary | ICD-10-CM

## 2015-06-01 DIAGNOSIS — M25611 Stiffness of right shoulder, not elsewhere classified: Secondary | ICD-10-CM

## 2015-06-01 DIAGNOSIS — Z9189 Other specified personal risk factors, not elsewhere classified: Secondary | ICD-10-CM

## 2015-06-01 NOTE — Progress Notes (Signed)
[facility] [facility]  Clinical Social Work was referred by patient for assessment of psychosocial needs.  Clinical Social Worker met with patient at [facility] in [place] office to offer support and assess for needs. Pt provided with supportive listening. Her biggest concerns currently are starting radiation treatment and returning to work. She is eager to begin radiation and has been delayed due to some side effects of her surgery. 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 CSW and pt processed ways to make that transition easier on her physically and emotionally. Pt has one daughter, 79yo. who is very supportive.  CSW reviewed many resources to assist pt at [facility]. CSW provided her with Support Team calendar and options for support. CSW and pt to check in once she starts radiation. Pt in good spirits and able to express her emotions and needs successfully. Pt aware to reach out to CSW as needed.   Clinical Social Work interventions: Supportive listening Resource education  Loren Racer, [place] Worker [place]  [place] Phone: [phone] Fax: [phone]

## 2015-06-01 NOTE — Patient Instructions (Signed)
[email or web address] Lymphatic flow series with Shoosh Lettick Crotzer 

## 2015-06-01 NOTE — Therapy (Signed)
[place], [place], [address] Phone: [phone]   Fax:  [phone]  Physical Therapy Treatment  Patient Details  Name: Jamie Edwards MRN: [id number] Date of Birth: 11/19/1970 Referring Provider:  Kelton Pillar, MD  Encounter Date: 06/01/2015      PT End of Session - 06/01/15 1742    Visit Number 13   Number of Visits 13   Date for PT Re-Evaluation 06/19/15   PT Start Time 1300   PT Stop Time 1348   PT Time Calculation (min) 48 min   Activity Tolerance Patient tolerated treatment well   Behavior During Therapy [facility] for tasks assessed/performed      Past Medical History  Diagnosis Date  . Increased heart rate 11/2014    pt. put on metoprolol  . Pulmonary embolism 11/2014  . DVT (deep venous thrombosis) 11/2014    BUE  . Cancer of right breast 08/2014    S/P chemo 09/2014-02/02/2015  . Heart murmur     pt. states a doctor stated she had a slight murmur, no studies done. Present PCP has never mention it to her.    Past Surgical History  Procedure Laterality Date  . Mouth surgery      implants  . Portacath placement Right 08/31/2014    Procedure: INSERTION PORT-A-CATH WITH ULTRA SOUND GUIDENCE;  Surgeon: Erroll Luna, MD;  Location: [facility];  Service: General;  Laterality: Right;  . Mastectomy complete / simple w/ sentinel node biopsy Right 04/12/2015  . Mastectomy complete / simple Left 04/12/2015    PROPHYLATIC  . Breast reconstruction with placement of tissue expander and flex hd (acellular hydrated dermis) Bilateral 04/12/2015  . Port-a-cath removal  04/12/2015  . Breast biopsy Right 07/2014  . Simple mastectomy with axillary sentinel node biopsy Bilateral 04/12/2015    Procedure: BILATERAL TOTAL MASTECTOMY WITH RIGHT AXILLARY SENTINEL LYMPH  NODE BIOPSY, POSSIBLE AXILLARY DISSECTION, LEFT PROPHYLATIC;  Surgeon: Excell Seltzer, MD;  Location: [facility];  Service: General;   Laterality: Bilateral;  . Port-a-cath removal Right 04/12/2015    Procedure: REMOVAL PORT-A-CATH;  Surgeon: Excell Seltzer, MD;  Location: [facility];  Service: General;  Laterality: Right;  . Breast reconstruction with placement of tissue expander and flex hd (acellular hydrated dermis) Bilateral 04/12/2015    Procedure: IMMEDIATE BILATERAL BREAST RECONSTRUCTION WITH PLACEMENT OF TISSUE EXPANDER AND FLEX HD (ACELLULAR HYDRATED DERMIS);  Surgeon: Theodoro Kos, DO;  Location: [facility];  Service: Plastics;  Laterality: Bilateral;    There were no vitals filed for this visit.  Visit Diagnosis:  Stiffness of shoulder joint, right  Stiffness of shoulder joint, left  At risk for lymphedema      Subjective Assessment - 06/01/15 1303    Subjective CT scan on Monday to see if ready for radiation.   Patient Stated Goals to improve arm mobility and strengthen both sides.    Currently in Pain? Yes   Pain Score 3    Pain Location Arm   Pain Orientation Right;Upper;Medial                         [facility] Adult PT Treatment/Exercise - 06/01/15 0001    Shoulder Exercises: Supine   Protraction AROM  pt limited by pain "tight" below right implant    Other Supine Exercises dowel exercise for both arms for shoulder flexion 2 sets of 10    Other Supine Exercises "radiation position" with pillow under elbow  Shoulder Exercises: Sidelying   External Rotation AROM;Right;10 reps  1#    Theraband Level (Shoulder Flexion) Other (comment)  to 90 degress. small circles in both directions   Other Sidelying Exercises "telescoping arms" stretch as tolerated    Shoulder Exercises: Pulleys   Flexion 3 minutes   ABduction 3 minutes   Shoulder Exercises: Therapy Ball   Flexion 10 reps  up the wall with stretch at the top    Shoulder Exercises: ROM/Strengthening   Other ROM/Strengthening Exercises finger ladder for abduction x 5 each   Other ROM/Strengthening Exercises yellow theraband loop on both  arms, 5 reps wall walking, alternating elbow back "w" and forearms to wall slide up    Shoulder Exercises: Stretch   Other Shoulder Stretches bilat. active shoulder rolls backwards   Manual Therapy   Soft tissue mobilization --   Myofascial Release --   Manual Lymphatic Drainage (MLD) briefly, to right scapular area in left sidelying , foam pad placed here under bra and also chip pack for axilla around chest   Passive ROM --   Neck Exercises: Stretches   Other Neck Stretches lymphatic flow series                 PT Education - 06/01/15 1741    Education provided Yes   Education Details lymphatic flow yoga series weblink. Use of foam for compression to back and axilla    Person(s) Educated Patient   Methods Explanation;Demonstration;Handout   Comprehension Verbalized understanding;Returned demonstration           Short Term Clinic Goals - 05/31/15 1526    CC Short Term Goal  #1   Title Patient with verbalize an understanding of lymphedema risk reduction precautions   Status On-going   CC Short Term Goal  #2   Title Patient will improve right  shoulder abduction to 120 degrees so that she can achieve position needed for radiation therapy.   Status On-going             Long Term Clinic Goals - 05/16/15 1108    CC Long Term Goal  #5   Title Patient will have a circumferential reduction of   1      cm at    10      cm above the             olecranon process  >3 cm reduction today down to 35.7 cm 05/16/15   Status Achieved            Plan - 06/01/15 1742    Clinical Impression Statement Pt contiues to be limited by painful tightness in right pec major and axillary area that is not responding well to stretching and active exercise.  she did better with sidelying work today.  Upgraded program to include foam compression to areas and reinforced need to stretch in "radiation postition"    Clinical Impairments Affecting Rehab Potential PE earlier this year with UE  swelling    PT Next Visit Plan Continue focus on shoulder ROM in preparation for XRT/ cont contract/relax with PROM; try to see 3x/week.assess if foam is making a difference         Problem List Patient Active Problem List   Diagnosis Date Noted  . Breast cancer, right breast 04/12/2015  . Arm DVT (deep venous thromboembolism), acute 12/03/2014  . Antineoplastic chemotherapy induced pancytopenia 11/30/2014  . Pulmonary embolism 11/29/2014  . Bronchitis 11/25/2014  . Tachycardia 11/25/2014  . Genetic testing  10/11/2014  . Monoallelic mutation of PALB2 gene 10/11/2014  . Constipation 10/05/2014  . Breast cancer of upper-outer quadrant of right female breast 08/10/2014   Donato Heinz. Owens Shark, PT  06/01/2015, 5:45 PM  [place], [place], [address] Phone: [phone]   Fax:  [phone]

## 2015-06-05 ENCOUNTER — Ambulatory Visit
Admission: RE | Admit: 2015-06-05 | Payer: Commercial Managed Care - HMO | Source: Ambulatory Visit | Admitting: Radiation Oncology

## 2015-06-06 ENCOUNTER — Ambulatory Visit: Payer: Commercial Managed Care - HMO | Admitting: Physical Therapy

## 2015-06-07 ENCOUNTER — Ambulatory Visit: Payer: Commercial Managed Care - HMO

## 2015-06-07 DIAGNOSIS — C50411 Malignant neoplasm of upper-outer quadrant of right female breast: Secondary | ICD-10-CM

## 2015-06-07 DIAGNOSIS — M25612 Stiffness of left shoulder, not elsewhere classified: Secondary | ICD-10-CM

## 2015-06-07 DIAGNOSIS — Z9189 Other specified personal risk factors, not elsewhere classified: Secondary | ICD-10-CM

## 2015-06-07 DIAGNOSIS — M25611 Stiffness of right shoulder, not elsewhere classified: Secondary | ICD-10-CM | POA: Diagnosis not present

## 2015-06-07 NOTE — Therapy (Signed)
[name], [place], [address] Phone: [phone]   Fax:  [phone]  Physical Therapy Treatment  Patient Details  Name: Jamie Edwards MRN: [id number] Date of Birth: [birth year] Referring Provider:  Eppie Gibson, MD  Encounter Date: 06/07/2015      PT End of Session - 06/07/15 1514    Visit Number 14   Number of Visits 14   Date for PT Re-Evaluation 06/19/15   PT Start Time 1435   PT Stop Time 1517   PT Time Calculation (min) 42 min   Activity Tolerance Patient tolerated treatment well;Patient limited by pain   Behavior During Therapy [facility] for tasks assessed/performed      Past Medical History  Diagnosis Date  . Increased heart rate 11/2014    pt. put on metoprolol  . Pulmonary embolism 11/2014  . DVT (deep venous thrombosis) 11/2014    BUE  . Cancer of right breast 08/2014    S/P chemo 09/2014-02/02/2015  . Heart murmur     pt. states a doctor stated she had a slight murmur, no studies done. Present PCP has never mention it to her.    Past Surgical History  Procedure Laterality Date  . Mouth surgery      implants  . Portacath placement Right 08/31/2014    Procedure: INSERTION PORT-A-CATH WITH ULTRA SOUND GUIDENCE;  Surgeon: Erroll Luna, MD;  Location: [place];  Service: General;  Laterality: Right;  . Mastectomy complete / simple w/ sentinel node biopsy Right 04/12/2015  . Mastectomy complete / simple Left 04/12/2015    PROPHYLATIC  . Breast reconstruction with placement of tissue expander and flex hd (acellular hydrated dermis) Bilateral 04/12/2015  . Port-a-cath removal  04/12/2015  . Breast biopsy Right 07/2014  . Simple mastectomy with axillary sentinel node biopsy Bilateral 04/12/2015    Procedure: BILATERAL TOTAL MASTECTOMY WITH RIGHT AXILLARY SENTINEL LYMPH  NODE BIOPSY, POSSIBLE AXILLARY DISSECTION, LEFT PROPHYLATIC;  Surgeon: Excell Seltzer, MD;  Location: [place];  Service: General;  Laterality: Bilateral;  . Port-a-cath removal Right 04/12/2015    Procedure: REMOVAL PORT-A-CATH;  Surgeon: Excell Seltzer, MD;  Location: [place];  Service: General;  Laterality: Right;  . Breast reconstruction with placement of tissue expander and flex hd (acellular hydrated dermis) Bilateral 04/12/2015    Procedure: IMMEDIATE BILATERAL BREAST RECONSTRUCTION WITH PLACEMENT OF TISSUE EXPANDER AND FLEX HD (ACELLULAR HYDRATED DERMIS);  Surgeon: Theodoro Kos, DO;  Location: [place];  Service: Plastics;  Laterality: Bilateral;    There were no vitals filed for this visit.  Visit Diagnosis:  Stiffness of shoulder joint, right  Stiffness of shoulder joint, left  At risk for lymphedema  Cancer of upper-outer quadrant of female breast, right      Subjective Assessment - 06/07/15 1442    Subjective I've had better days, feel nauseous and have a HA, I think I ate somethin that didn't agree with me over the weekend. Having the same Rt axilllary and upper arm pain, but the back of my hand is bothering me a little too.    Currently in Pain? Yes   Pain Score 3    Pain Location Axilla   Pain Orientation Right   Pain Descriptors / Indicators Sore   Pain Onset 1 to 4 weeks ago   Aggravating Factors  over stretching   Pain Relieving Factors soft sleeve helps            [facility] PT Assessment - 06/07/15  0001    AROM   Right Shoulder Flexion 103 Degrees   Right Shoulder ABduction 95 Degrees                     [facility] Adult PT Treatment/Exercise - 06/07/15 0001    Shoulder Exercises: Supine   Other Supine Exercises Horizontal abduction and then scaption 5 reps each with slow, long holds   Other Supine Exercises "Radiation position" with fingers clasped FOR AAROM at end range by Lt UE 5 reps, 10 second holds   Manual Therapy   Soft tissue mobilization very gently, superior and inferior to right breast   Myofascial Release right UE myofascial pulling, gently, in  supine only with limited ROM into abduction   Scapular Mobilization In Lt S/L to Rt scapula into pro-and retraction.    Passive ROM In Supine to Rt sholder into flexion and abduction and with focus on radiation positioning., also in Lt S/L as tolerated                   Short Term Clinic Goals - 06/07/15 1519    CC Short Term Goal  #1   Title Patient with verbalize an understanding of lymphedema risk reduction precautions   CC Short Term Goal  #2   Title Patient will improve right  shoulder abduction to 120 degrees so that she can achieve position needed for radiation therapy.  95 degrees today 06/07/15   Status On-going             [facility] Goals - 05/16/15 1108    CC Long Term Goal  #5   Title Patient will have a circumferential reduction of   1      cm at    10      cm above the             olecranon process  >3 cm reduction today down to 35.7 cm 05/16/15   Status Achieved            Plan - 06/07/15 1514    Clinical Impression Statement Pt continues to be limited by pain and very tight tissue at Rt axila and c/o tightness at Rt inferior breast. Her AROM measurements have slightly improved, progress towards goal.    Pt will benefit from skilled therapeutic intervention in order to improve on the following deficits Impaired UE functional use;Increased edema;Decreased range of motion;Decreased endurance;Decreased knowledge of precautions;Decreased activity tolerance;Decreased scar mobility;Decreased strength;Pain;Decreased knowledge of use of DME;Postural dysfunction   Rehab Potential Excellent   Clinical Impairments Affecting Rehab Potential PE earlier this year with UE swelling    PT Frequency 3x / week   PT Duration 6 weeks   PT Treatment/Interventions Therapeutic exercise;Manual techniques;Passive range of motion   PT Next Visit Plan Continue focus on shoulder ROM in preparation for XRT/ cont contract/relax with PROM; try to see 3x/week.assess if foam is  making a difference    Recommended Other Services Faxed prescription to Dr. Isidore Moos for compression garment for pt for going back to work as she reports TG soft has made a difference.   Consulted and Agree with Plan of Care Patient        Problem List Patient Active Problem List   Diagnosis Date Noted  . Breast cancer, right breast 04/12/2015  . Arm DVT (deep venous thromboembolism), acute 12/03/2014  . Antineoplastic chemotherapy induced pancytopenia 11/30/2014  . Pulmonary embolism 11/29/2014  . Bronchitis 11/25/2014  . Tachycardia 11/25/2014  .  Genetic testing 10/11/2014  . Monoallelic mutation of PALB2 gene 10/11/2014  . Constipation 10/05/2014  . Breast cancer of upper-outer quadrant of right female breast 08/10/2014    Otelia Limes, PTA 06/07/2015, 3:21 PM  [place], [place], [address] Phone: [phone]   Fax:  [phone]

## 2015-06-08 ENCOUNTER — Ambulatory Visit: Payer: Commercial Managed Care - HMO | Admitting: Physical Therapy

## 2015-06-09 ENCOUNTER — Ambulatory Visit: Payer: Commercial Managed Care - HMO | Attending: Radiation Oncology | Admitting: Physical Therapy

## 2015-06-09 DIAGNOSIS — M25612 Stiffness of left shoulder, not elsewhere classified: Secondary | ICD-10-CM | POA: Insufficient documentation

## 2015-06-09 DIAGNOSIS — M25611 Stiffness of right shoulder, not elsewhere classified: Secondary | ICD-10-CM | POA: Insufficient documentation

## 2015-06-09 DIAGNOSIS — Z9189 Other specified personal risk factors, not elsewhere classified: Secondary | ICD-10-CM | POA: Diagnosis present

## 2015-06-09 DIAGNOSIS — C50411 Malignant neoplasm of upper-outer quadrant of right female breast: Secondary | ICD-10-CM | POA: Insufficient documentation

## 2015-06-09 NOTE — Patient Instructions (Signed)
   Flexibility: Corner Stretch  Standing in corner with hands just above shoulder level and feet ____ inches from corner, lean forward until a comfortable stretch is felt across chest. Hold ____ seconds. Repeat ____ times per set. Do ____ sets per session. Do ____ sessions per   Lie on you back, elbow straight, Stretch and reach up to ceiling feeling the movement in your shoulder blade.

## 2015-06-09 NOTE — Therapy (Signed)
[place], [place], [address] Phone: [phone]   Fax:  [phone]  Physical Therapy Treatment  Patient Details  Name: Jamie Edwards MRN: [id number] Date of Birth: 1971-04-14 Referring Provider:  Eppie Gibson, MD  Encounter Date: 06/09/2015      PT End of Session - 06/09/15 1245    Visit Number 15   Number of Visits 25   Date for PT Re-Evaluation 07/14/15   PT Start Time 0930   PT Stop Time 1015   PT Time Calculation (min) 45 min   Activity Tolerance Patient limited by pain   Behavior During Therapy [facility] for tasks assessed/performed      Past Medical History  Diagnosis Date  . Increased heart rate 11/2014    pt. put on metoprolol  . Pulmonary embolism 11/2014  . DVT (deep venous thrombosis) 11/2014    BUE  . Cancer of right breast 08/2014    S/P chemo 09/2014-02/02/2015  . Heart murmur     pt. states a doctor stated she had a slight murmur, no studies done. Present PCP has never mention it to her.    Past Surgical History  Procedure Laterality Date  . Mouth surgery      implants  . Portacath placement Right 08/31/2014    Procedure: INSERTION PORT-A-CATH WITH ULTRA SOUND GUIDENCE;  Surgeon: Jamie Luna, MD;  Location: [place];  Service: General;  Laterality: Right;  . Mastectomy complete / simple w/ sentinel node biopsy Right 04/12/2015  . Mastectomy complete / simple Left 04/12/2015    PROPHYLATIC  . Breast reconstruction with placement of tissue expander and flex hd (acellular hydrated dermis) Bilateral 04/12/2015  . Port-a-cath removal  04/12/2015  . Breast biopsy Right 07/2014  . Simple mastectomy with axillary sentinel node biopsy Bilateral 04/12/2015    Procedure: BILATERAL TOTAL MASTECTOMY WITH RIGHT AXILLARY SENTINEL LYMPH  NODE BIOPSY, POSSIBLE AXILLARY DISSECTION, LEFT PROPHYLATIC;  Surgeon: Jamie Seltzer, MD;  Location: [place];  Service: General;  Laterality:  Bilateral;  . Port-a-cath removal Right 04/12/2015    Procedure: REMOVAL PORT-A-CATH;  Surgeon: Jamie Seltzer, MD;  Location: [place];  Service: General;  Laterality: Right;  . Breast reconstruction with placement of tissue expander and flex hd (acellular hydrated dermis) Bilateral 04/12/2015    Procedure: IMMEDIATE BILATERAL BREAST RECONSTRUCTION WITH PLACEMENT OF TISSUE EXPANDER AND FLEX HD (ACELLULAR HYDRATED DERMIS);  Surgeon: Jamie Kos, DO;  Location: [place];  Service: Plastics;  Laterality: Bilateral;    There were no vitals filed for this visit.  Visit Diagnosis:  Stiffness of shoulder joint, right - Plan: PT plan of care cert/re-cert  Stiffness of shoulder joint, left - Plan: PT plan of care cert/re-cert  At risk for lymphedema - Plan: PT plan of care cert/re-cert      Subjective Assessment - 06/09/15 0932    Subjective They werent able to do the simulation. Dr. Isidore Edwards pushed it back to Sept 13 Pt states she is going back to work on Tuesday and will try to work as many hours as she can.  She continues to have the tightness in her arm, below the breast and in the axilla. She is going to [facility] [place]  to get a sleeve today.     Pertinent History   prefers to go by the name "Jamie Edwards"  bilateral mastectomy with node biopsy ( unsure how many anywhere from 8-20) on july 6 with immediate expanders . drains still intact. Has  had chemo that ended april 28 and had numbness and tingling mostly in toes that has improved.  Plans to have radiatoin at the end of August.   Blood clot in lung in February of this year  She said she both arms swell when in the hospital for the blood clot.   Currently in Pain? Yes   Pain Score 2   pt grimaces in severe pain with palpation of tight areas in axilla    Pain Location Axilla   Pain Orientation Right   Pain Descriptors / Indicators Sore   Pain Type Acute pain   Pain Radiating Towards down to chest under incisions   Pain Onset More than a month ago    Aggravating Factors  stretching    Pain Relieving Factors compression from foam             [facility] PT Assessment - 06/09/15 0001    AROM   Right Shoulder Flexion 105 Degrees  supine    Right Shoulder ABduction 84 Degrees  supine   Right Shoulder External Rotation 75 Degrees  in supine.                     [facility] Adult PT Treatment/Exercise - 06/09/15 0001    Shoulder Exercises: Supine   Protraction AROM  pt limited by pain "tight" below right implant    Other Supine Exercises external rotation with arm at 90 degrees of abduction and arm flat on table  with contract/relax technique   Shoulder Exercises: Pulleys   Flexion 2 minutes   ABduction 2 minutes  limited and painful    Shoulder Exercises: ROM/Strengthening   Proximal Shoulder Strengthening, Seated table stretch in flexion and abduction    Other ROM/Strengthening Exercises yellow theraband loop on both arms, 5 reps wall walking, alternating elbow back "w" and forearms to wall slide up    Shoulder Exercises: Stretch   Corner Stretch 3 reps   Manual Therapy   Manual therapy comments pt with exquisite tenderness and tightness at anterior chest tendons and posterior axilla with about  2 cm deep contracton into axilla just proximal to implant.   Passive ROM In Supine to Rt sholder into flexion and abduction and with focus on radiation positioning., also in Lt S/L as tolerated                   Short Term Clinic Goals - 06/09/15 1255    CC Short Term Goal  #1   Title Patient with verbalize an understanding of lymphedema risk reduction precautions   Status On-going   CC Short Term Goal  #2   Title Patient will improve right  shoulder abduction to 120 degrees so that she can achieve position needed for radiation therapy.   Status On-going             Long Term Clinic Goals - 06/09/15 1256    CC Long Term Goal  #1   Title Patient will report a decrease in pain by 50% so they can perform daily  activities with greater ease   Status On-going   CC Long Term Goal  #2   Title Patient will be independent in a home exercise program   Status On-going   CC Long Term Goal  #3   Title Patient will be know how to obtain and use compression garments for maintenance phase of treatment   Status On-going   CC Long Term Goal  #4   Title  Patient will decrease the DASH score to < 20   to demonstrate increased functional use of upper extremity   Status On-going   CC Long Term Goal  #5   Title Patient will have a circumferential reduction of   1      cm at    10      cm above the             olecranon process   Status Achieved            Plan - 06/09/15 1246    Clinical Impression Statement Pt is very tight and tender today and is limited in the amount of stretching she can tolerate. She says that the use of foam chip ball in axilla helps the muscle to relax, but then it tightens up again. She is discouraged that she was unable to begin radiation.  Suggested she contact plastic surgeon to see if there are other suggestions to help with tightness and will send renewal for more PT so she can recieve receive radtation.  Will try to increase PT to x per week as able  for more stretching.    Pt will benefit from skilled therapeutic intervention in order to improve on the following deficits Impaired UE functional use;Increased edema;Decreased range of motion;Decreased endurance;Decreased knowledge of precautions;Decreased activity tolerance;Decreased strength;Pain;Decreased knowledge of use of DME;Postural dysfunction   Rehab Potential Good   Clinical Impairments Affecting Rehab Potential PE earlier this year with UE swelling    PT Frequency 2x / week   PT Duration 4 weeks   PT Treatment/Interventions Therapeutic exercise;Manual techniques;Passive range of motion;Scar mobilization;Patient/family education   PT Next Visit Plan Continue focus on shoulder ROM in preparation for XRT/ cont contract/relax  with PROM; try to see 3x/week.assess new sleeve   Consulted and Agree with Plan of Care Patient        Problem List Patient Active Problem List   Diagnosis Date Noted  . Breast cancer, right breast 04/12/2015  . Arm DVT (deep venous thromboembolism), acute 12/03/2014  . Antineoplastic chemotherapy induced pancytopenia 11/30/2014  . Pulmonary embolism 11/29/2014  . Bronchitis 11/25/2014  . Tachycardia 11/25/2014  . Genetic testing 10/11/2014  . Monoallelic mutation of PALB2 gene 10/11/2014  . Constipation 10/05/2014  . Breast cancer of upper-outer quadrant of right female breast 08/10/2014   Donato Heinz. Owens Shark, PT  06/09/2015, 1:01 PM  [place], [place], [address] Phone: [phone]   Fax:  [phone]

## 2015-06-13 ENCOUNTER — Ambulatory Visit: Payer: Commercial Managed Care - HMO | Admitting: Physical Therapy

## 2015-06-13 DIAGNOSIS — M25611 Stiffness of right shoulder, not elsewhere classified: Secondary | ICD-10-CM | POA: Diagnosis not present

## 2015-06-13 DIAGNOSIS — Z9189 Other specified personal risk factors, not elsewhere classified: Secondary | ICD-10-CM

## 2015-06-13 DIAGNOSIS — M25612 Stiffness of left shoulder, not elsewhere classified: Secondary | ICD-10-CM

## 2015-06-13 NOTE — Therapy (Signed)
[place], [place], [address] Phone: [phone]   Fax:  [phone]  Physical Therapy Treatment  Patient Details  Name: Jamie Edwards MRN: [id number] Date of Birth: 1971-09-03 Referring Provider:  Eppie Gibson, MD  Encounter Date: 06/13/2015      PT End of Session - 06/13/15 1122    Visit Number 16   Number of Visits 25   Date for PT Re-Evaluation 07/14/15   PT Start Time 1017   PT Stop Time 1103   PT Time Calculation (min) 46 min   Activity Tolerance Patient limited by pain   Behavior During Therapy [facility] for tasks assessed/performed      Past Medical History  Diagnosis Date  . Increased heart rate 11/2014    pt. put on metoprolol  . Pulmonary embolism 11/2014  . DVT (deep venous thrombosis) 11/2014    BUE  . Cancer of right breast 08/2014    S/P chemo 09/2014-02/02/2015  . Heart murmur     pt. states a doctor stated she had a slight murmur, no studies done. Present PCP has never mention it to her.    Past Surgical History  Procedure Laterality Date  . Mouth surgery      implants  . Portacath placement Right 08/31/2014    Procedure: INSERTION PORT-A-CATH WITH ULTRA SOUND GUIDENCE;  Surgeon: Erroll Luna, MD;  Location: [place];  Service: General;  Laterality: Right;  . Mastectomy complete / simple w/ sentinel node biopsy Right 04/12/2015  . Mastectomy complete / simple Left 04/12/2015    PROPHYLATIC  . Breast reconstruction with placement of tissue expander and flex hd (acellular hydrated dermis) Bilateral 04/12/2015  . Port-a-cath removal  04/12/2015  . Breast biopsy Right 07/2014  . Simple mastectomy with axillary sentinel node biopsy Bilateral 04/12/2015    Procedure: BILATERAL TOTAL MASTECTOMY WITH RIGHT AXILLARY SENTINEL LYMPH  NODE BIOPSY, POSSIBLE AXILLARY DISSECTION, LEFT PROPHYLATIC;  Surgeon: Excell Seltzer, MD;  Location: [place];  Service: General;  Laterality:  Bilateral;  . Port-a-cath removal Right 04/12/2015    Procedure: REMOVAL PORT-A-CATH;  Surgeon: Excell Seltzer, MD;  Location: [place];  Service: General;  Laterality: Right;  . Breast reconstruction with placement of tissue expander and flex hd (acellular hydrated dermis) Bilateral 04/12/2015    Procedure: IMMEDIATE BILATERAL BREAST RECONSTRUCTION WITH PLACEMENT OF TISSUE EXPANDER AND FLEX HD (ACELLULAR HYDRATED DERMIS);  Surgeon: Theodoro Kos, DO;  Location: [place];  Service: Plastics;  Laterality: Bilateral;    There were no vitals filed for this visit.  Visit Diagnosis:  Stiffness of shoulder joint, right  Stiffness of shoulder joint, left  At risk for lymphedema      Subjective Assessment - 06/13/15 1021    Subjective pt states she went  the plastic surgeon and they removed 50 cc from the right side. Pt states that it feels better. sleeve has not been ordered because of out of network insurance . She has applied for an "out of network gap " and should know in 10 -15 days. Pt returns to work full time today    Pertinent History   prefers to go by the name "Jamie Edwards"  bilateral mastectomy with node biopsy ( unsure how many anywhere from 8-20) on july 6 with immediate expanders . drains still intact. Has had chemo that ended april 28 and had numbness and tingling mostly in toes that has improved.  Plans to have radiatoin at the end of August.  Blood clot in lung in February of this year  She said she both arms swell when in the hospital for the blood clot.   Currently in Pain? No/denies  at rest, up to 4 with pressure at trigger points             [facility] PT Assessment - 06/13/15 0001    AROM   Right Shoulder Flexion 115 Degrees  sitting   Right Shoulder ABduction 105 Degrees  sitting                      [facility] Adult PT Treatment/Exercise - 06/13/15 0001    Shoulder Exercises: Supine   Other Supine Exercises dowel exercise for both arms for shoulder flexion with knees  rotated to left with pillow under knees extra time to take a deep breath and really stretch through chest     Shoulder Exercises: Sidelying   Other Sidelying Exercises scapular AROM    Shoulder Exercises: Standing   Other Standing Exercises modified downward dog with hips stretched to left side to increase stretch to right lateral chest    Manual Therapy   Soft tissue mobilization in sidelying, with biotone, soft tissue work with trigger point myofascial work to inferior axilla and lateral chest. with pt reporting some sympromatic relief. 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  Then in supine, attempted same to pec major. Pt continues with extreme tightness of muscle and tendons here with some, but minimal respnse to deep pressure.                    Short Term Clinic Goals - 06/09/15 1255    CC Short Term Goal  #1   Title Patient with verbalize an understanding of lymphedema risk reduction precautions   Status On-going   CC Short Term Goal  #2   Title Patient will improve right  shoulder abduction to 120 degrees so that she can achieve position needed for radiation therapy.   Status On-going             Long Term Clinic Goals - 06/09/15 1256    CC Long Term Goal  #1   Title Patient will report a decrease in pain by 50% so they can perform daily activities with greater ease   Status On-going   CC Long Term Goal  #2   Title Patient will be independent in a home exercise program   Status On-going   CC Long Term Goal  #3   Title Patient will be know how to obtain and use compression garments for maintenance phase of treatment   Status On-going   CC Long Term Goal  #4   Title Patient will decrease the DASH score to < 20   to demonstrate increased functional use of upper extremity   Status On-going   CC Long Term Goal  #5   Title Patient will have a circumferential  reduction of   1      cm at    10      cm above the             olecranon process   Status Achieved            Plan - 06/13/15 1123    Clinical Impression Statement Pt has had some relief from removal of fluid from expander, but continues with muscle and tissue irritation  in axilla with tenderness and tightenss to palpation with resistance to stretching.  She has better response to manual work at posterior axilla and had increased range of motion today. However, she does not have enough to allow radiation treatment yet.    PT Next Visit Plan Continue focus on shoulder ROM in preparation for XRT with manual work and exercise , contract/relax with PROM; try to see 3x/week if able    Consulted and Agree with Plan of Care Patient        Problem List Patient Active Problem List   Diagnosis Date Noted  . Breast cancer, right breast 04/12/2015  . Arm DVT (deep venous thromboembolism), acute 12/03/2014  . Antineoplastic chemotherapy induced pancytopenia 11/30/2014  . Pulmonary embolism 11/29/2014  . Bronchitis 11/25/2014  . Tachycardia 11/25/2014  . Genetic testing 10/11/2014  . Monoallelic mutation of PALB2 gene 10/11/2014  . Constipation 10/05/2014  . Breast cancer of upper-outer quadrant of right female breast 08/10/2014   Donato Heinz. Owens Shark, PT  06/13/2015, 11:31 AM  [place], [place], [address] Phone: [phone]   Fax:  [phone]

## 2015-06-15 ENCOUNTER — Encounter: Payer: Self-pay | Admitting: Genetic Counselor

## 2015-06-15 ENCOUNTER — Ambulatory Visit: Payer: Commercial Managed Care - HMO | Admitting: Physical Therapy

## 2015-06-19 ENCOUNTER — Ambulatory Visit: Payer: Commercial Managed Care - HMO | Admitting: Physical Therapy

## 2015-06-19 DIAGNOSIS — M25611 Stiffness of right shoulder, not elsewhere classified: Secondary | ICD-10-CM | POA: Diagnosis not present

## 2015-06-19 NOTE — Therapy (Signed)
[place], [place], [address] Phone: [phone]   Fax:  [phone]  Physical Therapy Treatment  Patient Details  Name: Jamie Edwards MRN: [id number] Date of Birth: Dec 24, 1970 Referring Provider:  Eppie Gibson, MD  Encounter Date: 06/19/2015      PT End of Session - 06/19/15 1712    Visit Number 17   Number of Visits 25   Date for PT Re-Evaluation 07/14/15   PT Start Time 1608   PT Stop Time 1657   PT Time Calculation (min) 49 min   Activity Tolerance Patient limited by pain   Behavior During Therapy [facility] for tasks assessed/performed      Past Medical History  Diagnosis Date  . Increased heart rate 11/2014    pt. put on metoprolol  . Pulmonary embolism 11/2014  . DVT (deep venous thrombosis) 11/2014    BUE  . Cancer of right breast 08/2014    S/P chemo 09/2014-02/02/2015  . Heart murmur     pt. states a doctor stated she had a slight murmur, no studies done. Present PCP has never mention it to her.    Past Surgical History  Procedure Laterality Date  . Mouth surgery      implants  . Portacath placement Right 08/31/2014    Procedure: INSERTION PORT-A-CATH WITH ULTRA SOUND GUIDENCE;  Surgeon: Erroll Luna, MD;  Location: [place];  Service: General;  Laterality: Right;  . Mastectomy complete / simple w/ sentinel node biopsy Right 04/12/2015  . Mastectomy complete / simple Left 04/12/2015    PROPHYLATIC  . Breast reconstruction with placement of tissue expander and flex hd (acellular hydrated dermis) Bilateral 04/12/2015  . Port-a-cath removal  04/12/2015  . Breast biopsy Right 07/2014  . Simple mastectomy with axillary sentinel node biopsy Bilateral 04/12/2015    Procedure: BILATERAL TOTAL MASTECTOMY WITH RIGHT AXILLARY SENTINEL LYMPH  NODE BIOPSY, POSSIBLE AXILLARY DISSECTION, LEFT PROPHYLATIC;  Surgeon: Excell Seltzer, MD;  Location: [place];  Service: General;  Laterality:  Bilateral;  . Port-a-cath removal Right 04/12/2015    Procedure: REMOVAL PORT-A-CATH;  Surgeon: Excell Seltzer, MD;  Location: [place];  Service: General;  Laterality: Right;  . Breast reconstruction with placement of tissue expander and flex hd (acellular hydrated dermis) Bilateral 04/12/2015    Procedure: IMMEDIATE BILATERAL BREAST RECONSTRUCTION WITH PLACEMENT OF TISSUE EXPANDER AND FLEX HD (ACELLULAR HYDRATED DERMIS);  Surgeon: Theodoro Kos, DO;  Location: [place];  Service: Plastics;  Laterality: Bilateral;    There were no vitals filed for this visit.  Visit Diagnosis:  Stiffness of shoulder joint, right  Stiffness of shoulder joint, left      Subjective Assessment - 06/19/15 1610    Subjective Still feels tight sometimes, like before.   Currently in Pain? Yes   Pain Score 5    Pain Location Arm  and below breast   Pain Orientation Right;Upper   Aggravating Factors  stretching, picking things up   Pain Relieving Factors (not taking pain meds now)                         [facility] Adult PT Treatment/Exercise - 06/19/15 0001    Shoulder Exercises: Sidelying   Other Sidelying Exercises horizontal abduction with patient performing D2 diagonals with right arm, following her hand with her gaze x 5   Shoulder Exercises: Standing   Horizontal ABduction --  diagonals with patient following hand with her gaze  Flexion AROM;Right;Left;5 reps   ABduction AROM;Both;5 reps   Other Standing Exercises finger ladder for abduction of shoulder, right and left x 5 each   Other Standing Exercises UE ranger in standing with stand on floor and on 8 inch step, lunge forward for right shoulder flexion, then with UE ranger in wall stand for right shoulder flexion and abduction x 5 each.   Shoulder Exercises: Pulleys   Flexion 2 minutes   ABduction 2 minutes  limited and painful    Shoulder Exercises: Therapy Ball   Flexion 10 reps  up the wall with stretch at the top    Manual  Therapy   Myofascial Release crosshands technique at right chest in horizontal, vertical, and diagonals; right UE myofascial pulling in supine with gentle stretch into abduction   Scapular Mobilization in left sidelying, mobilization of right scapula, especially in protraction   Passive ROM supine with stretch for right shoulder ER                   Short Term Clinic Goals - 06/09/15 1255    CC Short Term Goal  #1   Title Patient with verbalize an understanding of lymphedema risk reduction precautions   Status On-going   CC Short Term Goal  #2   Title Patient will improve right  shoulder abduction to 120 degrees so that she can achieve position needed for radiation therapy.   Status On-going             Long Term Clinic Goals - 06/09/15 1256    CC Long Term Goal  #1   Title Patient will report a decrease in pain by 50% so they can perform daily activities with greater ease   Status On-going   CC Long Term Goal  #2   Title Patient will be independent in a home exercise program   Status On-going   CC Long Term Goal  #3   Title Patient will be know how to obtain and use compression garments for maintenance phase of treatment   Status On-going   CC Long Term Goal  #4   Title Patient will decrease the DASH score to < 20   to demonstrate increased functional use of upper extremity   Status On-going   CC Long Term Goal  #5   Title Patient will have a circumferential reduction of   1      cm at    10      cm above the             olecranon process   Status Achieved            Plan - 06/19/15 1712    Clinical Impression Statement Patient still with significantly limited ROM and pain.  She did feel looser after treatment, and felt the UE ranger was beneficial.  Reports tightness just inferior to right breast with some of stretches.     Pt will benefit from skilled therapeutic intervention in order to improve on the following deficits Other (comment)  inability to get  right UE into position for radiation treatment   Rehab Potential Good   Clinical Impairments Affecting Rehab Potential PE earlier this year with UE swelling    PT Frequency 3x / week   PT Duration 4 weeks   PT Treatment/Interventions Therapeutic exercise;Manual techniques;Passive range of motion   PT Next Visit Plan Remeasure right shoulder ROM; try soft tissue mobilization just inferior to right breast; continue therapeutic exercise  with UE ranger and PROM right shoulder plus myofascial release   Consulted and Agree with Plan of Care Patient        Problem List Patient Active Problem List   Diagnosis Date Noted  . Breast cancer, right breast 04/12/2015  . Arm DVT (deep venous thromboembolism), acute 12/03/2014  . Antineoplastic chemotherapy induced pancytopenia 11/30/2014  . Pulmonary embolism 11/29/2014  . Bronchitis 11/25/2014  . Tachycardia 11/25/2014  . Genetic testing 10/11/2014  . Monoallelic mutation of PALB2 gene 10/11/2014  . Constipation 10/05/2014  . Breast cancer of upper-outer quadrant of right female breast 08/10/2014    SALISBURY,DONNA 06/19/2015, 5:16 PM  [place], [place], [address] Phone: [phone]   Fax:  [place], PT 06/19/2015 5:16 PM

## 2015-06-20 ENCOUNTER — Ambulatory Visit: Payer: Commercial Managed Care - HMO

## 2015-06-20 DIAGNOSIS — M25612 Stiffness of left shoulder, not elsewhere classified: Secondary | ICD-10-CM

## 2015-06-20 DIAGNOSIS — Z9189 Other specified personal risk factors, not elsewhere classified: Secondary | ICD-10-CM

## 2015-06-20 DIAGNOSIS — M25611 Stiffness of right shoulder, not elsewhere classified: Secondary | ICD-10-CM | POA: Diagnosis not present

## 2015-06-20 DIAGNOSIS — C50411 Malignant neoplasm of upper-outer quadrant of right female breast: Secondary | ICD-10-CM

## 2015-06-20 NOTE — Therapy (Signed)
[place], [place], [address] Phone: [phone]   Fax:  [phone]  Physical Therapy Treatment  Patient Details  Name: Jamie Edwards MRN: [id number] Date of Birth: 03-06-1971 Referring Provider:  Eppie Gibson, MD  Encounter Date: 06/20/2015      PT End of Session - 06/20/15 0929    Visit Number 18   Number of Visits 25   Date for PT Re-Evaluation 07/14/15   PT Start Time 0820   PT Stop Time 0905   PT Time Calculation (min) 45 min   Activity Tolerance Patient limited by pain   Behavior During Therapy [facility] for tasks assessed/performed      Past Medical History  Diagnosis Date  . Increased heart rate 11/2014    pt. put on metoprolol  . Pulmonary embolism 11/2014  . DVT (deep venous thrombosis) 11/2014    BUE  . Cancer of right breast 08/2014    S/P chemo 09/2014-02/02/2015  . Heart murmur     pt. states a doctor stated she had a slight murmur, no studies done. Present PCP has never mention it to her.    Past Surgical History  Procedure Laterality Date  . Mouth surgery      implants  . Portacath placement Right 08/31/2014    Procedure: INSERTION PORT-A-CATH WITH ULTRA SOUND GUIDENCE;  Surgeon: Erroll Luna, MD;  Location: [place];  Service: General;  Laterality: Right;  . Mastectomy complete / simple w/ sentinel node biopsy Right 04/12/2015  . Mastectomy complete / simple Left 04/12/2015    PROPHYLATIC  . Breast reconstruction with placement of tissue expander and flex hd (acellular hydrated dermis) Bilateral 04/12/2015  . Port-a-cath removal  04/12/2015  . Breast biopsy Right 07/2014  . Simple mastectomy with axillary sentinel node biopsy Bilateral 04/12/2015    Procedure: BILATERAL TOTAL MASTECTOMY WITH RIGHT AXILLARY SENTINEL LYMPH  NODE BIOPSY, POSSIBLE AXILLARY DISSECTION, LEFT PROPHYLATIC;  Surgeon: Excell Seltzer, MD;  Location: [place];  Service: General;  Laterality:  Bilateral;  . Port-a-cath removal Right 04/12/2015    Procedure: REMOVAL PORT-A-CATH;  Surgeon: Excell Seltzer, MD;  Location: [place];  Service: General;  Laterality: Right;  . Breast reconstruction with placement of tissue expander and flex hd (acellular hydrated dermis) Bilateral 04/12/2015    Procedure: IMMEDIATE BILATERAL BREAST RECONSTRUCTION WITH PLACEMENT OF TISSUE EXPANDER AND FLEX HD (ACELLULAR HYDRATED DERMIS);  Surgeon: Theodoro Kos, DO;  Location: [place];  Service: Plastics;  Laterality: Bilateral;    There were no vitals filed for this visit.  Visit Diagnosis:  Stiffness of shoulder joint, right  Stiffness of shoulder joint, left  At risk for lymphedema  Cancer of upper-outer quadrant of female breast, right      Subjective Assessment - 06/20/15 0827    Subjective Bad traffic on highway caused me to run late. Feel really good after yesterdays visit, feel stretched.    Currently in Pain? No/denies            [facility] PT Assessment - 06/20/15 0001    AROM   Right Shoulder Flexion 114 Degrees  Sitting   Right Shoulder ABduction 90 Degrees  Sitting                     [facility] Adult PT Treatment/Exercise - 06/20/15 0001    Shoulder Exercises: Standing   Other Standing Exercises UE Ranger for Rt UE flexion and abduction on wall, then on 8" stand for flexion  and horizontal abduction 8-10 reps each; tactile cuing througout for proper technique   Shoulder Exercises: Therapy Ball   Flexion 10 reps  Roll yellow ball up wall   Shoulder Exercises: ROM/Strengthening   Other ROM/Strengthening Exercises Reviewed counter top stretches for flexion; pendulum exercises front-back and side-side with constant cuing to try to relax shoulder girdle.    Other ROM/Strengthening Exercises Modified prone with pillow at sternum for no pressure to Rt breast for relaxation of shoulder girdle, then flexion and horizontal abduction with assist from therapist, also tried with pt having  Rt LE on floor just to make sure to keep pressure off of Rt breast and to help pt to relax muscles better as she has trouble with being able to fully relax.   Manual Therapy   Myofascial Release Crosshands technique to Rt chest wall horizontally   Passive ROM In supine for flexion, abduction, and ER; then modified prone (see positioning above) for flexion and horizontal abduction and gentle shoulder mobs inferiorly grade I-II                PT Education - 06/20/15 0928    Education provided Yes   Education Details Pendulum exercises and countertop stretches for flexion; modified prone stretching with max cuing for no pressure to expander   Person(s) Educated Patient   Methods Explanation;Demonstration   Comprehension Verbalized understanding;Returned demonstration           Short Term Clinic Goals - 06/20/15 1016    CC Short Term Goal  #1   Title Patient with verbalize an understanding of lymphedema risk reduction precautions   Status Achieved   CC Short Term Goal  #2   Title Patient will improve right  shoulder abduction to 120 degrees so that she can achieve position needed for radiation therapy.  90 degrees and pt still with painful compensations at end ROM 06/20/15   Status On-going             [place] [facility] Goals - 06/20/15 1016    CC Long Term Goal  #1   Title Patient will report a decrease in pain by 50% so they can perform daily activities with greater ease   Status On-going   CC Long Term Goal  #2   Title Patient will be independent in a home exercise program   Status On-going   CC Long Term Goal  #3   Title Patient will be know how to obtain and use compression garments for maintenance phase of treatment   Status On-going   CC Long Term Goal  #4   Title Patient will decrease the DASH score to < 20   to demonstrate increased functional use of upper extremity   CC Long Term Goal  #5   Title Patient will have a circumferential reduction of   1      cm  at    10      cm above the             olecranon process   Status Achieved            Plan - 06/20/15 0959    Clinical Impression Statement Pt very frustrated with her slow progress with her being consistent about stretching here and at home. Instructed her today in relaxation exercises to perform in addition to her HEP and explained to her that we are trying to retrain her muscles that have been in a protective mode/posture with everything her body has  and is still undergoing and this was no fault to her though she felt that way. AROM measurements were decreased from last time measured and pt reported not having had her muscle relaxer yet today and thought that was why.   Rehab Potential Good   Clinical Impairments Affecting Rehab Potential PE earlier this year with UE swelling    PT Frequency 3x / week   PT Duration 4 weeks   PT Treatment/Interventions Therapeutic exercise;Manual techniques;Passive range of motion   PT Next Visit Plan Update by inbox Dr. Isidore Moos that pt won't be able to get into radiation position at this time. Continue UE ranger and modified prone stretching; soft tissue mobilization inferior to Rt breast plus PROM to Rt shoulder with myofascial release to same.     PT Home Exercise Plan Pendulum and modified prone exercises to assist pt in relaxing her shoulder girdle musculature with other HEP stretches.    Recommended Other Services Dry needling??   Consulted and Agree with Plan of Care Patient        Problem List Patient Active Problem List   Diagnosis Date Noted  . Breast cancer, right breast 04/12/2015  . Arm DVT (deep venous thromboembolism), acute 12/03/2014  . Antineoplastic chemotherapy induced pancytopenia 11/30/2014  . Pulmonary embolism 11/29/2014  . Bronchitis 11/25/2014  . Tachycardia 11/25/2014  . Genetic testing 10/11/2014  . Monoallelic mutation of PALB2 gene 10/11/2014  . Constipation 10/05/2014  . Breast cancer of upper-outer quadrant of  right female breast 08/10/2014    Otelia Limes, PTA 06/20/2015, 10:18 AM  [place], [place], [address] Phone: [phone]   Fax:  [phone]

## 2015-06-22 ENCOUNTER — Telehealth: Payer: Self-pay | Admitting: Oncology

## 2015-06-22 ENCOUNTER — Other Ambulatory Visit: Payer: Self-pay | Admitting: Hematology and Oncology

## 2015-06-22 ENCOUNTER — Ambulatory Visit: Payer: Commercial Managed Care - HMO

## 2015-06-22 DIAGNOSIS — M25612 Stiffness of left shoulder, not elsewhere classified: Secondary | ICD-10-CM

## 2015-06-22 DIAGNOSIS — Z9189 Other specified personal risk factors, not elsewhere classified: Secondary | ICD-10-CM

## 2015-06-22 DIAGNOSIS — C50411 Malignant neoplasm of upper-outer quadrant of right female breast: Secondary | ICD-10-CM

## 2015-06-22 DIAGNOSIS — M25611 Stiffness of right shoulder, not elsewhere classified: Secondary | ICD-10-CM

## 2015-06-22 NOTE — Telephone Encounter (Signed)
Left a message for [facility] regarding contacting her surgeon to have 50 cc fluid removed before her CT [facility] appointment on Monday.  Requested a return call.

## 2015-06-22 NOTE — Telephone Encounter (Addendum)
Katelin called back and said she had 50 cc's of fluid removed from her breast last week.  She has made an appointment with Dr. Leafy Ro PA for tomorrow at 11:20.  She also said she is having trouble with feeling like her skin is pulling under her right breast.  She is not sure if the expander is causing this.  She is wondering if she needs to keep her appointment for CT [facility] on Monday.  Advised her to call after her appointment with Dr. Leafy Ro office tomorrow.

## 2015-06-22 NOTE — Therapy (Signed)
[place], [place], [address] Phone: [phone]   Fax:  [phone]  Physical Therapy Treatment  Patient Details  Name: Jamie Edwards MRN: [id number] Date of Birth: 1971/02/19 Referring Provider:  Eppie Gibson, MD  Encounter Date: 06/22/2015      PT End of Session - 06/22/15 0850    Visit Number 19   Number of Visits 25   Date for PT Re-Evaluation 07/14/15   PT Start Time 0802   PT Stop Time 0848   PT Time Calculation (min) 46 min   Activity Tolerance Patient limited by pain   Behavior During Therapy [facility] for tasks assessed/performed      Past Medical History  Diagnosis Date  . Increased heart rate 11/2014    pt. put on metoprolol  . Pulmonary embolism 11/2014  . DVT (deep venous thrombosis) 11/2014    BUE  . Cancer of right breast 08/2014    S/P chemo 09/2014-02/02/2015  . Heart murmur     pt. states a doctor stated she had a slight murmur, no studies done. Present PCP has never mention it to her.    Past Surgical History  Procedure Laterality Date  . Mouth surgery      implants  . Portacath placement Right 08/31/2014    Procedure: INSERTION PORT-A-CATH WITH ULTRA SOUND GUIDENCE;  Surgeon: Erroll Luna, MD;  Location: [place];  Service: General;  Laterality: Right;  . Mastectomy complete / simple w/ sentinel node biopsy Right 04/12/2015  . Mastectomy complete / simple Left 04/12/2015    PROPHYLATIC  . Breast reconstruction with placement of tissue expander and flex hd (acellular hydrated dermis) Bilateral 04/12/2015  . Port-a-cath removal  04/12/2015  . Breast biopsy Right 07/2014  . Simple mastectomy with axillary sentinel node biopsy Bilateral 04/12/2015    Procedure: BILATERAL TOTAL MASTECTOMY WITH RIGHT AXILLARY SENTINEL LYMPH  NODE BIOPSY, POSSIBLE AXILLARY DISSECTION, LEFT PROPHYLATIC;  Surgeon: Excell Seltzer, MD;  Location: [place];  Service: General;  Laterality:  Bilateral;  . Port-a-cath removal Right 04/12/2015    Procedure: REMOVAL PORT-A-CATH;  Surgeon: Excell Seltzer, MD;  Location: [place];  Service: General;  Laterality: Right;  . Breast reconstruction with placement of tissue expander and flex hd (acellular hydrated dermis) Bilateral 04/12/2015    Procedure: IMMEDIATE BILATERAL BREAST RECONSTRUCTION WITH PLACEMENT OF TISSUE EXPANDER AND FLEX HD (ACELLULAR HYDRATED DERMIS);  Surgeon: Theodoro Kos, DO;  Location: [place];  Service: Plastics;  Laterality: Bilateral;    There were no vitals filed for this visit.  Visit Diagnosis:  Stiffness of shoulder joint, right  Stiffness of shoulder joint, left  At risk for lymphedema  Cancer of upper-outer quadrant of female breast, right      Subjective Assessment - 06/22/15 0805    Subjective Did the countertop exercises (pendulums and flexion stretch) and those seemed to help.    Currently in Pain? No/denies            [facility] PT Assessment - 06/22/15 0001    AROM   Right Shoulder Flexion 108 Degrees   Right Shoulder ABduction 85 Degrees                     [facility] Adult PT Treatment/Exercise - 06/22/15 0001    Shoulder Exercises: Pulleys   Flexion 2 minutes   ABduction 2 minutes   Shoulder Exercises: Therapy Ball   Flexion 10 reps   Flexion Limitations With forearms on ball  for modified stretch to allow pt to focus on decreasing her scapular compensations.   Manual Therapy   Soft tissue mobilization To Rt anterior shoulder/axilla   Myofascial Release At Rt chest wall during ROM   Passive ROM In supine for flexion, abduction with and without external rotation for radiation positioning.                    Short Term Clinic Goals - 06/20/15 1016    CC Short Term Goal  #1   Title Patient with verbalize an understanding of lymphedema risk reduction precautions   Status Achieved   CC Short Term Goal  #2   Title Patient will improve right  shoulder abduction to 120  degrees so that she can achieve position needed for radiation therapy.  90 degrees and pt still with painful compensations at end ROM 06/20/15   Status On-going             [place] Clinic Goals - 06/20/15 1016    CC Long Term Goal  #1   Title Patient will report a decrease in pain by 50% so they can perform daily activities with greater ease   Status On-going   CC Long Term Goal  #2   Title Patient will be independent in a home exercise program   Status On-going   CC Long Term Goal  #3   Title Patient will be know how to obtain and use compression garments for maintenance phase of treatment   Status On-going   CC Long Term Goal  #4   Title Patient will decrease the DASH score to < 20   to demonstrate increased functional use of upper extremity   CC Long Term Goal  #5   Title Patient will have a circumferential reduction of   1      cm at    10      cm above the             olecranon process   Status Achieved            Plan - 06/22/15 0851    Clinical Impression Statement I received an inbox message from Dr. Isidore Moos in response to one sent regarding pt not ready for radiation sim yet and she sent message to request surgeon to call pt to have additional 50 cc's fluid removed to see if this will give pt enough relief to be able to start sim on Monday. Instructed pt in this as well so she is updated as to what is going on and to expect a call from surgeon, she verbalized understanding. Pts AROM measurements taken again at end of session and they were even less than 2 days ago. Pt feels tighter at lateral border of expander and she reports feels like it "shifts" at night causing her more pain.  Pt plans to address all these concerns with the surgeon when she is able to get in hopefully today.   Rehab Potential Good   Clinical Impairments Affecting Rehab Potential PE earlier this year with UE swelling    PT Frequency 3x / week   PT Duration 4 weeks   PT Treatment/Interventions  Therapeutic exercise;Manual techniques;Passive range of motion   PT Next Visit Plan Pt is supposed to get call from surgeons office today about having more fluid removed from expander at Dr. Pearlie Oyster request, assess ROM at next visit and cont manual techniques to Rt shoulder and chest wall; UE Ranger as  well.   Consulted and Agree with Plan of Care Patient        Problem List Patient Active Problem List   Diagnosis Date Noted  . Breast cancer, right breast 04/12/2015  . Arm DVT (deep venous thromboembolism), acute 12/03/2014  . Antineoplastic chemotherapy induced pancytopenia 11/30/2014  . Pulmonary embolism 11/29/2014  . Bronchitis 11/25/2014  . Tachycardia 11/25/2014  . Genetic testing 10/11/2014  . Monoallelic mutation of PALB2 gene 10/11/2014  . Constipation 10/05/2014  . Breast cancer of upper-outer quadrant of right female breast 08/10/2014    Otelia Limes, PTA 06/22/2015, 8:57 AM  [place], [place], [address] Phone: [phone]   Fax:  [phone]

## 2015-06-23 ENCOUNTER — Telehealth: Payer: Self-pay | Admitting: Oncology

## 2015-06-23 NOTE — Telephone Encounter (Signed)
Jamie Edwards called and said she saw Dr. Migdalia Dk today.  Dr. Migdalia Dk does not recommend removing any more fluid from her breast as she would not be able to put the fluid back in after radiation.  She would then need to have latissimus surgery.  Dr. Bobetta Lime believes the issue is in her shoulder.  She was given an exercises and was instructed to loosen her upper arm and to press downwards on her shoulder.  Dr. Migdalia Dk is also recommending massage to her shoulder to help her relax.  As for the pulling feeling under her breast, Dr. Migdalia Dk said the flap is still healing and is causing the pulling sensation.  [name] is going to do the exercises over the weekend and have a massage.  She would like to keep the [facility] appointment on Monday morning.  She also mentioned that Dr. Leafy Ro office would like [place] to give them a call.

## 2015-06-26 ENCOUNTER — Ambulatory Visit
Admission: RE | Admit: 2015-06-26 | Discharge: 2015-06-26 | Disposition: A | Discharge status: Home / Self Care | Payer: Commercial Managed Care - HMO | Source: Ambulatory Visit | Attending: Radiation Oncology | Admitting: Radiation Oncology

## 2015-06-26 ENCOUNTER — Telehealth: Payer: Self-pay

## 2015-06-26 DIAGNOSIS — Z510 Encounter for antineoplastic radiation therapy: Secondary | ICD-10-CM | POA: Diagnosis not present

## 2015-06-26 DIAGNOSIS — C50411 Malignant neoplasm of upper-outer quadrant of right female breast: Secondary | ICD-10-CM

## 2015-06-26 NOTE — Telephone Encounter (Signed)
Call report rcvd from [facility].  Sent to scan.    LMOVM - pt to return call to clinic.

## 2015-06-26 NOTE — Progress Notes (Signed)
  Radiation Oncology         [phone] ________________________________  Name: Jamie Edwards MRN: [id number]  Date: 06/26/2015  DOB: [birth year]  SIMULATION AND TREATMENT PLANNING NOTE    Outpatient  DIAGNOSIS:     ICD-9-CM ICD-10-CM   1. Breast cancer of upper-outer quadrant of right female breast 174.4 C50.411     NARRATIVE:  The patient was brought to the [facility].  Identity was confirmed.  All relevant records and images related to the planned course of therapy were reviewed.  The patient freely provided informed written consent to proceed with treatment after reviewing the details related to the planned course of therapy. The consent form was witnessed and verified by the simulation staff.    Then, the patient was set-up in a stable reproducible supine position for radiation therapy with her ipsilateral arm over her head, and her upper body secured in a custom-made Vac-lok device.  CT images were obtained.  Surface markings were placed.  The CT images were loaded into the planning software.    TREATMENT PLANNING NOTE: Treatment planning then occurred.  The radiation prescription was entered and confirmed.     A total of 5 medically necessary complex treatment devices were fabricated and supervised by me: 4 fields with MLCs for custom blocks to protect heart, cord, esophagus and lungs;  and, a Vac-lok. I have requested : 3D Simulation  I have requested a DVH of the following structures: lungs, heart, cord, esophagus.    The patient will receive 50.4 Gy in 28 fractions to the right chest wall and axillary/SCV nodes with 4 photon fields.   This will  be followed by a boost to the chest wall scar.  Optical Surface Tracking Plan:  Since intensity modulated radiotherapy (IMRT) and 3D conformal radiation treatment methods are predicated on accurate and precise positioning for treatment, intrafraction motion monitoring is medically necessary to ensure accurate and  safe treatment delivery. The ability to quantify intrafraction motion without excessive ionizing radiation dose can only be performed with optical surface tracking. Accordingly, surface imaging offers the opportunity to obtain 3D measurements of patient position throughout IMRT and 3D treatments without excessive radiation exposure. I am ordering optical surface tracking for this patient's upcoming course of radiotherapy.  ________________________________   Reference:  Ursula Alert, J, et al. Surface imaging-based analysis of intrafraction motion for breast radiotherapy patients.Journal of [place], n. 6, nov. 2014. ISSN [id number].  Available at: <http://www.jacmp.org/index.php/jacmp/article/view/4957>.    -----------------------------------  Eppie Gibson, MD

## 2015-06-27 ENCOUNTER — Telehealth: Payer: Self-pay | Admitting: *Deleted

## 2015-06-27 NOTE — Telephone Encounter (Signed)
Returned call to pt re: sleep.  Advised pt to try melatonin and/or benadryl and to call if they did not work.  Pt advised will not be complete by 10/13.  Let pt know I would send request to scheduling.  Pt voiced understanding.

## 2015-06-27 NOTE — Telephone Encounter (Signed)
VM message received @ 0955 from patient returning call regarding her request for medication for sleep. Please return call to patient. Thanks.

## 2015-06-27 NOTE — Telephone Encounter (Signed)
Call report rcvd from [facility] 06/23/15. Marland Kitchen  Sent to scan.

## 2015-06-28 ENCOUNTER — Ambulatory Visit
Admission: RE | Admit: 2015-06-28 | Discharge: 2015-06-28 | Disposition: A | Discharge status: Home / Self Care | Payer: Commercial Managed Care - HMO | Source: Ambulatory Visit | Attending: Radiation Oncology | Admitting: Radiation Oncology

## 2015-06-28 ENCOUNTER — Ambulatory Visit: Payer: Commercial Managed Care - HMO

## 2015-06-28 DIAGNOSIS — Z510 Encounter for antineoplastic radiation therapy: Secondary | ICD-10-CM | POA: Diagnosis not present

## 2015-06-29 ENCOUNTER — Ambulatory Visit
Admission: RE | Admit: 2015-06-29 | Discharge: 2015-06-29 | Disposition: A | Discharge status: Home / Self Care | Payer: Commercial Managed Care - HMO | Source: Ambulatory Visit | Attending: Radiation Oncology | Admitting: Radiation Oncology

## 2015-06-29 DIAGNOSIS — Z510 Encounter for antineoplastic radiation therapy: Secondary | ICD-10-CM | POA: Diagnosis not present

## 2015-06-29 DIAGNOSIS — C50911 Malignant neoplasm of unspecified site of right female breast: Secondary | ICD-10-CM

## 2015-06-29 MED ORDER — ALRA NON-METALLIC DEODORANT (RAD-ONC)
1.0000 "application " | Freq: Once | TOPICAL | Status: AC
  Administered 2015-06-29: 1 via TOPICAL

## 2015-06-29 MED ORDER — RADIAPLEXRX EX GEL
Freq: Once | CUTANEOUS | Status: AC
  Administered 2015-06-29: 18:00:00 via TOPICAL

## 2015-06-30 ENCOUNTER — Ambulatory Visit: Payer: Commercial Managed Care - HMO | Admitting: Physical Therapy

## 2015-06-30 ENCOUNTER — Ambulatory Visit
Admission: RE | Admit: 2015-06-30 | Discharge: 2015-06-30 | Disposition: A | Discharge status: Home / Self Care | Payer: Commercial Managed Care - HMO | Source: Ambulatory Visit | Attending: Radiation Oncology | Admitting: Radiation Oncology

## 2015-06-30 DIAGNOSIS — Z9189 Other specified personal risk factors, not elsewhere classified: Secondary | ICD-10-CM

## 2015-06-30 DIAGNOSIS — M25611 Stiffness of right shoulder, not elsewhere classified: Secondary | ICD-10-CM

## 2015-06-30 DIAGNOSIS — M25612 Stiffness of left shoulder, not elsewhere classified: Secondary | ICD-10-CM

## 2015-06-30 DIAGNOSIS — Z510 Encounter for antineoplastic radiation therapy: Secondary | ICD-10-CM | POA: Diagnosis not present

## 2015-06-30 DIAGNOSIS — C50411 Malignant neoplasm of upper-outer quadrant of right female breast: Secondary | ICD-10-CM

## 2015-06-30 NOTE — Therapy (Signed)
[place], [place], [address] Phone: [phone]   Fax:  [phone]  Physical Therapy Treatment  Patient Details  Name: Jamie Edwards MRN: [id number] Date of Birth: May 03, 1971 Referring Provider:  Eppie Gibson, MD  Encounter Date: 06/30/2015      PT End of Session - 06/30/15 0940    Visit Number 20   Number of Visits 25   Date for PT Re-Evaluation 07/14/15   PT Start Time 0934   PT Stop Time 1015   PT Time Calculation (min) 41 min   Activity Tolerance Patient limited by pain   Behavior During Therapy [facility] for tasks assessed/performed      Past Medical History  Diagnosis Date  . Increased heart rate 11/2014    pt. put on metoprolol  . Pulmonary embolism 11/2014  . DVT (deep venous thrombosis) 11/2014    BUE  . Cancer of right breast 08/2014    S/P chemo 09/2014-02/02/2015  . Heart murmur     pt. states a doctor stated she had a slight murmur, no studies done. Present PCP has never mention it to her.    Past Surgical History  Procedure Laterality Date  . Mouth surgery      implants  . Portacath placement Right 08/31/2014    Procedure: INSERTION PORT-A-CATH WITH ULTRA SOUND GUIDENCE;  Surgeon: Erroll Luna, MD;  Location: [facility];  Service: General;  Laterality: Right;  . Mastectomy complete / simple w/ sentinel node biopsy Right 04/12/2015  . Mastectomy complete / simple Left 04/12/2015    PROPHYLATIC  . Breast reconstruction with placement of tissue expander and flex hd (acellular hydrated dermis) Bilateral 04/12/2015  . Port-a-cath removal  04/12/2015  . Breast biopsy Right 07/2014  . Simple mastectomy with axillary sentinel node biopsy Bilateral 04/12/2015    Procedure: BILATERAL TOTAL MASTECTOMY WITH RIGHT AXILLARY SENTINEL LYMPH  NODE BIOPSY, POSSIBLE AXILLARY DISSECTION, LEFT PROPHYLATIC;  Surgeon: Excell Seltzer, MD;  Location: [facility];  Service: General;  Laterality:  Bilateral;  . Port-a-cath removal Right 04/12/2015    Procedure: REMOVAL PORT-A-CATH;  Surgeon: Excell Seltzer, MD;  Location: [facility];  Service: General;  Laterality: Right;  . Breast reconstruction with placement of tissue expander and flex hd (acellular hydrated dermis) Bilateral 04/12/2015    Procedure: IMMEDIATE BILATERAL BREAST RECONSTRUCTION WITH PLACEMENT OF TISSUE EXPANDER AND FLEX HD (ACELLULAR HYDRATED DERMIS);  Surgeon: Theodoro Kos, DO;  Location: [facility];  Service: Plastics;  Laterality: Bilateral;    There were no vitals filed for this visit.  Visit Diagnosis:  At risk for lymphedema  Stiffness of shoulder joint, right  Stiffness of shoulder joint, left  Cancer of upper-outer quadrant of female breast, right      Subjective Assessment - 06/30/15 0939    Subjective Saw md, did not have more fluid removed. Instead the md referred her for a gentle massage and did exercises in the office with her. On the next visit she was able to get her arm up high enought for the sim. Started radiation yesterday.  Stil feels tight in the axillary area and demo's limited range with raising arm today.  Currently in Pain? No/denies   Pain Score 0-No pain           [facility] PT Assessment - 06/30/15 1011    AROM   Right Shoulder Flexion 126 Degrees  no shoulder elevation, 135 with shoulder hike   Right Shoulder ABduction 95 Degrees  with stability to prevent substitutions          [facility] Adult PT Treatment/Exercise - 07/01/15 0001    Manual Therapy   Soft tissue mobilization To Rt anterior shoulder/axilla   Myofascial Release At Rt chest wall during ROM   Passive ROM In supine for flexion, abduction with and without external rotation for radiation positioning.      Exercise Pulley's x 2 minutes each for flexion and abduction pball at wall: rolling up/down with stretch at end ranges x 10 each for  flexion and abduction Finger ladder X 10 reps with highest level 20 with flex X 10 reps with highest level 19 with abduciton        Short Term Clinic Goals - 06/20/15 1016    CC Short Term Goal  #1   Title Patient with verbalize an understanding of lymphedema risk reduction precautions   Status Achieved   CC Short Term Goal  #2   Title Patient will improve right  shoulder abduction to 120 degrees so that she can achieve position needed for radiation therapy.  90 degrees and pt still with painful compensations at end ROM 06/20/15   Status On-going           [facility] Goals - 06/20/15 1016    CC Long Term Goal  #1   Title Patient will report a decrease in pain by 50% so they can perform daily activities with greater ease   Status On-going   CC Long Term Goal  #2   Title Patient will be independent in a home exercise program   Status On-going   CC Long Term Goal  #3   Title Patient will be know how to obtain and use compression garments for maintenance phase of treatment   Status On-going   CC Long Term Goal  #4   Title Patient will decrease the DASH score to < 20   to demonstrate increased functional use of upper extremity   CC Long Term Goal  #5   Title Patient will have a circumferential reduction of   1      cm at    10      cm above the             olecranon process   Status Achieved            Plan - 06/30/15 0941    Clinical Impression Statement Pt with increased range of motion with measurements today. Still with tightness at axilla area. Making steady progress toward goals.   Rehab Potential Good   Clinical Impairments Affecting Rehab Potential PE earlier this year with UE swelling    PT Frequency 3x / week   PT Duration 4 weeks   PT Treatment/Interventions Therapeutic exercise;Manual techniques;Passive range of motion   PT Next Visit Plan continue to work on range of motion, stretching and progress toward LTG's   Consulted and Agree with Plan of Care  Patient        Problem List Patient Active Problem List   Diagnosis Date Noted  . Breast cancer, right breast 04/12/2015  . Arm DVT (deep venous thromboembolism), acute 12/03/2014  . Antineoplastic  chemotherapy induced pancytopenia 11/30/2014  . Pulmonary embolism 11/29/2014  . Bronchitis 11/25/2014  . Tachycardia 11/25/2014  . Genetic testing 10/11/2014  . Monoallelic mutation of PALB2 gene 10/11/2014  . Constipation 10/05/2014  . Breast cancer of upper-outer quadrant of right female breast 08/10/2014    Willow Ora 07/01/2015, 12:37 AM  Willow Ora, PTA, [place] [address] [place], [place] [address] [phone] 07/01/2015, 12:39 AM

## 2015-07-03 ENCOUNTER — Ambulatory Visit
Admission: RE | Admit: 2015-07-03 | Discharge: 2015-07-03 | Disposition: A | Discharge status: Home / Self Care | Payer: Commercial Managed Care - HMO | Source: Ambulatory Visit | Attending: Radiation Oncology | Admitting: Radiation Oncology

## 2015-07-03 ENCOUNTER — Ambulatory Visit: Payer: Commercial Managed Care - HMO | Admitting: Physical Therapy

## 2015-07-03 ENCOUNTER — Encounter: Payer: Self-pay | Admitting: Radiation Oncology

## 2015-07-03 ENCOUNTER — Ambulatory Visit
Admission: RE | Admit: 2015-07-03 | Discharge: 2015-07-03 | Disposition: A | Discharge status: Home / Self Care | Payer: 59 | Source: Ambulatory Visit | Attending: Radiation Oncology | Admitting: Radiation Oncology

## 2015-07-03 VITALS — BP 104/68 | HR 89 | Temp 97.8°F | Ht 68.0 in | Wt 209.8 lb

## 2015-07-03 DIAGNOSIS — Z9189 Other specified personal risk factors, not elsewhere classified: Secondary | ICD-10-CM

## 2015-07-03 DIAGNOSIS — C50411 Malignant neoplasm of upper-outer quadrant of right female breast: Secondary | ICD-10-CM

## 2015-07-03 DIAGNOSIS — Z510 Encounter for antineoplastic radiation therapy: Secondary | ICD-10-CM | POA: Diagnosis not present

## 2015-07-03 DIAGNOSIS — M25611 Stiffness of right shoulder, not elsewhere classified: Secondary | ICD-10-CM | POA: Diagnosis not present

## 2015-07-03 NOTE — Progress Notes (Signed)
   Weekly Management Note:  Outpatient    ICD-9-CM ICD-10-CM   1. Breast cancer of upper-outer quadrant of right female breast 174.4 C50.411     Current Dose:  5.4 Gy  Projected Dose: 50.4 Gy initial  Narrative:  The patient presents for routine under treatment assessment.  CBCT/MVCT images/Port film x-rays were reviewed.  The chart was checked. NAD, doing well, no new complaints  Physical Findings:  height is 5\' 8"  (1.727 m) and weight is 209 lb 12.8 oz (95.165 kg). Her temperature is 97.8 F (36.6 C). Her blood pressure is 104/68 and her pulse is 89.   Wt Readings from Last 3 Encounters:  07/03/15 209 lb 12.8 oz (95.165 kg)  05/22/15 206 lb 1.6 oz (93.486 kg)  04/26/15 202 lb 3.2 oz (91.717 kg)   NAD, skin without changes over right chest wall so far  Impression:  The patient is tolerating radiotherapy.  Plan:  Continue radiotherapy as planned.    ________________________________   Eppie Gibson, M.D.

## 2015-07-03 NOTE — Patient Instructions (Signed)
Could try seated shoulder protraction stretches with hands clasped to stretch some of tight/trigger points in right upper back.

## 2015-07-03 NOTE — Therapy (Signed)
[facility] [address] [place], [place], [address] Phone: [phone]   Fax:  [phone]  Physical Therapy Treatment  Patient Details  Name: Jamie Edwards MRN: [id number] Date of Birth: 1971-08-12 Referring Provider:  Lonie Peak, MD  Encounter Date: 07/03/2015      PT End of Session - 07/03/15 1654    Visit Number 21   Number of Visits 25   Date for PT Re-Evaluation 07/14/15   PT Start Time 1606   PT Stop Time 1649   PT Time Calculation (min) 43 min   Activity Tolerance Patient limited by pain   Behavior During Therapy [facility] for tasks assessed/performed      Past Medical History  Diagnosis Date  . Increased heart rate 11/2014    pt. put on metoprolol  . Pulmonary embolism 11/2014  . DVT (deep venous thrombosis) 11/2014    BUE  . Cancer of right breast 08/2014    S/P chemo 09/2014-02/02/2015  . Heart murmur     pt. states a doctor stated she had a slight murmur, no studies done. Present PCP has never mention it to her.    Past Surgical History  Procedure Laterality Date  . Mouth surgery      implants  . Portacath placement Right 08/31/2014    Procedure: INSERTION PORT-A-CATH WITH ULTRA SOUND GUIDENCE;  Surgeon: Harriette Bouillon, MD;  Location: [facility];  Service: General;  Laterality: Right;  . Mastectomy complete / simple w/ sentinel node biopsy Right 04/12/2015  . Mastectomy complete / simple Left 04/12/2015    PROPHYLATIC  . Breast reconstruction with placement of tissue expander and flex hd (acellular hydrated dermis) Bilateral 04/12/2015  . Port-a-cath removal  04/12/2015  . Breast biopsy Right 07/2014  . Simple mastectomy with axillary sentinel node biopsy Bilateral 04/12/2015    Procedure: BILATERAL TOTAL MASTECTOMY WITH RIGHT AXILLARY SENTINEL LYMPH  NODE BIOPSY, POSSIBLE AXILLARY DISSECTION, LEFT PROPHYLATIC;  Surgeon: Glenna Fellows, MD;  Location: [facility];  Service: General;  Laterality:  Bilateral;  . Port-a-cath removal Right 04/12/2015    Procedure: REMOVAL PORT-A-CATH;  Surgeon: Glenna Fellows, MD;  Location: [facility] OR;  Service: General;  Laterality: Right;  . Breast reconstruction with placement of tissue expander and flex hd (acellular hydrated dermis) Bilateral 04/12/2015    Procedure: IMMEDIATE BILATERAL BREAST RECONSTRUCTION WITH PLACEMENT OF TISSUE EXPANDER AND FLEX HD (ACELLULAR HYDRATED DERMIS);  Surgeon: Wayland Denis, DO;  Location: [facility] OR;  Service: Plastics;  Laterality: Bilateral;    There were no vitals filed for this visit.  Visit Diagnosis:  At risk for lymphedema  Stiffness of shoulder joint, right      Subjective Assessment - 07/03/15 1608    Subjective Has to get help both into and out of the mold for radiation, but is able to tolerate the position for the length of time it takes.    Currently in Pain? No/denies                         [facility] Adult PT Treatment/Exercise - 07/03/15 0001    Shoulder Exercises: Seated   Protraction AAROM;Both  3 reps with hands clasped; on 3rd, moved up/down in flexion   Shoulder Exercises: Standing   Other Standing Exercises finger ladder for abduction of shoulder, right and left x 5 each   Shoulder Exercises: Pulleys   Flexion 2 minutes   ABduction 2 minutes   Shoulder Exercises: Therapy [place] Pies  Flexion 10 reps   Manual Therapy   Manual Therapy Other (comment)   Passive ROM in supine for right shoulder IR, ER, abduction, and flexion with some contract/relax (for ER and flexion); flexion with ER   Other Manual Therapy in left sidelying, gentle soft tissue work to right upper back for muscle relaxation and pain relief                   Short Term Clinic Goals - 06/20/15 1016    CC Short Term Goal  #1   Title Patient with verbalize an understanding of lymphedema risk reduction precautions   Status Achieved   CC Short Term Goal  #2   Title Patient will improve right  shoulder abduction  to 120 degrees so that she can achieve position needed for radiation therapy.  90 degrees and pt still with painful compensations at end ROM 06/20/15   Status On-going             Long Term Clinic Goals - 06/20/15 1016    CC Long Term Goal  #1   Title Patient will report a decrease in pain by 50% so they can perform daily activities with greater ease   Status On-going   CC Long Term Goal  #2   Title Patient will be independent in a home exercise program   Status On-going   CC Long Term Goal  #3   Title Patient will be know how to obtain and use compression garments for maintenance phase of treatment   Status On-going   CC Long Term Goal  #4   Title Patient will decrease the DASH score to < 20   to demonstrate increased functional use of upper extremity   CC Long Term Goal  #5   Title Patient will have a circumferential reduction of   1      cm at    10      cm above the             olecranon process   Status Achieved            Plan - 07/03/15 1702    Clinical Impression Statement Tried gentle soft tissue work to right upper back today, as that is what patient said the massage therapist did who helped her be able to get into position for radiation.  She has multiple very tender points in her right upper back periscapular area.  She did feel that massage was benefiicial.  She was also shown stretches to do for this area today.                                                       Pt will benefit from skilled therapeutic intervention in order to improve on the following deficits Decreased range of motion;Pain;Impaired UE functional use   Rehab Potential Good   Clinical Impairments Affecting Rehab Potential PE earlier this year with UE swelling    PT Frequency 3x / week   PT Duration 4 weeks   PT Treatment/Interventions Therapeutic exercise;Manual techniques;Passive range of motion   PT Next Visit Plan continue to work on range of motion, stretching and progress toward LTG's;  continue soft tissue work to right upper back if beneficial   Consulted and Agree with Plan of Care Patient  Problem List Patient Active Problem List   Diagnosis Date Noted  . Breast cancer, right breast 04/12/2015  . Arm DVT (deep venous thromboembolism), acute 12/03/2014  . Antineoplastic chemotherapy induced pancytopenia 11/30/2014  . Pulmonary embolism 11/29/2014  . Bronchitis 11/25/2014  . Tachycardia 11/25/2014  . Genetic testing 10/11/2014  . Monoallelic mutation of PALB2 gene 10/11/2014  . Constipation 10/05/2014  . Breast cancer of upper-outer quadrant of right female breast 08/10/2014    Rosalynn Sergent 07/03/2015, 5:06 PM  [place], [place], [address] Phone: [phone]   Fax:  [place], PT 07/03/2015 5:06 PM

## 2015-07-03 NOTE — Progress Notes (Signed)
Jamie Edwards has received 3 fractions to her right CW and [place] region.  No voiced concerns at this time.  Education on last Friday.

## 2015-07-04 ENCOUNTER — Ambulatory Visit
Admission: RE | Admit: 2015-07-04 | Discharge: 2015-07-04 | Disposition: A | Discharge status: Home / Self Care | Payer: Commercial Managed Care - HMO | Source: Ambulatory Visit | Attending: Radiation Oncology | Admitting: Radiation Oncology

## 2015-07-04 DIAGNOSIS — Z510 Encounter for antineoplastic radiation therapy: Secondary | ICD-10-CM | POA: Diagnosis not present

## 2015-07-05 ENCOUNTER — Ambulatory Visit
Admission: RE | Admit: 2015-07-05 | Discharge: 2015-07-05 | Disposition: A | Discharge status: Home / Self Care | Payer: Commercial Managed Care - HMO | Source: Ambulatory Visit | Attending: Radiation Oncology | Admitting: Radiation Oncology

## 2015-07-05 ENCOUNTER — Ambulatory Visit: Payer: Commercial Managed Care - HMO

## 2015-07-05 DIAGNOSIS — M25611 Stiffness of right shoulder, not elsewhere classified: Secondary | ICD-10-CM | POA: Diagnosis not present

## 2015-07-05 DIAGNOSIS — M25612 Stiffness of left shoulder, not elsewhere classified: Secondary | ICD-10-CM

## 2015-07-05 DIAGNOSIS — Z9189 Other specified personal risk factors, not elsewhere classified: Secondary | ICD-10-CM

## 2015-07-05 DIAGNOSIS — Z510 Encounter for antineoplastic radiation therapy: Secondary | ICD-10-CM | POA: Diagnosis not present

## 2015-07-05 NOTE — Therapy (Signed)
[place], [place], [address] Phone: [phone]   Fax:  [phone]  Physical Therapy Treatment  Patient Details  Name: Jamie Edwards MRN: [id number] Date of Birth: 06-22-1971 Referring Provider:  Eppie Gibson, MD  Encounter Date: 07/05/2015      PT End of Session - 07/05/15 1425    Visit Number 22   Number of Visits 25   Date for PT Re-Evaluation 07/14/15   PT Start Time 7048   PT Stop Time 1437   PT Time Calculation (min) 44 min   Activity Tolerance Patient limited by pain   Behavior During Therapy [facility] for tasks assessed/performed      Past Medical History  Diagnosis Date  . Increased heart rate 11/2014    pt. put on metoprolol  . Pulmonary embolism 11/2014  . DVT (deep venous thrombosis) 11/2014    BUE  . Cancer of right breast 08/2014    S/P chemo 09/2014-02/02/2015  . Heart murmur     pt. states a doctor stated she had a slight murmur, no studies done. Present PCP has never mention it to her.    Past Surgical History  Procedure Laterality Date  . Mouth surgery      implants  . Portacath placement Right 08/31/2014    Procedure: INSERTION PORT-A-CATH WITH ULTRA SOUND GUIDENCE;  Surgeon: Erroll Luna, MD;  Location: [place];  Service: General;  Laterality: Right;  . Mastectomy complete / simple w/ sentinel node biopsy Right 04/12/2015  . Mastectomy complete / simple Left 04/12/2015    PROPHYLATIC  . Breast reconstruction with placement of tissue expander and flex hd (acellular hydrated dermis) Bilateral 04/12/2015  . Port-a-cath removal  04/12/2015  . Breast biopsy Right 07/2014  . Simple mastectomy with axillary sentinel node biopsy Bilateral 04/12/2015    Procedure: BILATERAL TOTAL MASTECTOMY WITH RIGHT AXILLARY SENTINEL LYMPH  NODE BIOPSY, POSSIBLE AXILLARY DISSECTION, LEFT PROPHYLATIC;  Surgeon: Excell Seltzer, MD;  Location: [facility];  Service: General;  Laterality:  Bilateral;  . Port-a-cath removal Right 04/12/2015    Procedure: REMOVAL PORT-A-CATH;  Surgeon: Excell Seltzer, MD;  Location: [facility];  Service: General;  Laterality: Right;  . Breast reconstruction with placement of tissue expander and flex hd (acellular hydrated dermis) Bilateral 04/12/2015    Procedure: IMMEDIATE BILATERAL BREAST RECONSTRUCTION WITH PLACEMENT OF TISSUE EXPANDER AND FLEX HD (ACELLULAR HYDRATED DERMIS);  Surgeon: Theodoro Kos, DO;  Location: [facility];  Service: Plastics;  Laterality: Bilateral;    There were no vitals filed for this visit.  Visit Diagnosis:  At risk for lymphedema  Stiffness of shoulder joint, right  Stiffness of shoulder joint, left      Subjective Assessment - 07/05/15 1358    Subjective Feel pretty good today, not having any pain! They had to tape my arm to help it stay yesterday but it felt okay and it worked. The massage Butch Penny did at the end of treatment last itme really helped.    Currently in Pain? No/denies                         [facility] Adult PT Treatment/Exercise - 07/05/15 0001    Shoulder Exercises: Pulleys   Flexion 2 minutes   ABduction 2 minutes   Shoulder Exercises: Therapy Ball   Flexion 10 reps  Roll yellow ball up wall   Shoulder Exercises: Stretch   Other Shoulder Stretches External rotation stretch in doorframe 3  reps for 10 second holds Rt UE   Manual Therapy   Passive ROM in supine for right shoulder IR, ER, abduction, and flexion with some contract/relax (for ER and flexion); flexion with ER   Other Manual Therapy in left sidelying, gentle soft tissue work to right upper back for muscle relaxation and pain relief                   Short Term Clinic Goals - 06/20/15 1016    CC Short Term Goal  #1   Title Patient with verbalize an understanding of lymphedema risk reduction precautions   Status Achieved   CC Short Term Goal  #2   Title Patient will improve right  shoulder abduction to 120 degrees  so that she can achieve position needed for radiation therapy.  90 degrees and pt still with painful compensations at end ROM 06/20/15   Status On-going             [facility] Goals - 06/20/15 1016    CC Long Term Goal  #1   Title Patient will report a decrease in pain by 50% so they can perform daily activities with greater ease   Status On-going   CC Long Term Goal  #2   Title Patient will be independent in a home exercise program   Status On-going   CC Long Term Goal  #3   Title Patient will be know how to obtain and use compression garments for maintenance phase of treatment   Status On-going   CC Long Term Goal  #4   Title Patient will decrease the DASH score to < 20   to demonstrate increased functional use of upper extremity   CC Long Term Goal  #5   Title Patient will have a circumferential reduction of   1      cm at    10      cm above the             olecranon process   Status Achieved            Plan - 07/05/15 1439    Clinical Impression Statement Pt reported feeling much better after last session and todays. Massage is very helpful at end of treatment and pt also feels benefit of contract/relax. Said her insurance said it could take up to 90 days when applying for the insurance gap for her sleeve so wants to try [employer].    Pt will benefit from skilled therapeutic intervention in order to improve on the following deficits Decreased range of motion;Pain;Impaired UE functional use   Rehab Potential Good   Clinical Impairments Affecting Rehab Potential PE earlier this year with UE swelling    PT Frequency 3x / week   PT Duration 4 weeks   PT Treatment/Interventions Therapeutic exercise;Manual techniques;Passive range of motion   PT Next Visit Plan continue to work on range of motion, stretching and progress toward LTG's; continue soft tissue work to right upper back if beneficial   Recommended Other Services Sent pts demographics to [employer] requesting an off the shelf class II compression sleeve.        Problem List Patient Active Problem List   Diagnosis Date Noted  . Breast cancer, right breast 04/12/2015  . Arm DVT (deep venous thromboembolism), acute 12/03/2014  . Antineoplastic chemotherapy induced pancytopenia 11/30/2014  . Pulmonary embolism 11/29/2014  . Bronchitis 11/25/2014  . Tachycardia 11/25/2014  . Genetic testing  10/11/2014  . Monoallelic mutation of PALB2 gene 10/11/2014  . Constipation 10/05/2014  . Breast cancer of upper-outer quadrant of right female breast 08/10/2014    Otelia Limes, PTA 07/05/2015, 5:23 PM  [place], [place], [address] Phone: [phone]   Fax:  [phone]

## 2015-07-06 ENCOUNTER — Ambulatory Visit
Admission: RE | Admit: 2015-07-06 | Discharge: 2015-07-06 | Disposition: A | Discharge status: Home / Self Care | Payer: Commercial Managed Care - HMO | Source: Ambulatory Visit | Attending: Radiation Oncology | Admitting: Radiation Oncology

## 2015-07-06 DIAGNOSIS — Z510 Encounter for antineoplastic radiation therapy: Secondary | ICD-10-CM | POA: Diagnosis not present

## 2015-07-07 ENCOUNTER — Ambulatory Visit
Admission: RE | Admit: 2015-07-07 | Discharge: 2015-07-07 | Disposition: A | Discharge status: Home / Self Care | Payer: Commercial Managed Care - HMO | Source: Ambulatory Visit | Attending: Radiation Oncology | Admitting: Radiation Oncology

## 2015-07-07 DIAGNOSIS — Z510 Encounter for antineoplastic radiation therapy: Secondary | ICD-10-CM | POA: Diagnosis not present

## 2015-07-10 ENCOUNTER — Encounter: Payer: 59 | Admitting: Physical Therapy

## 2015-07-10 ENCOUNTER — Ambulatory Visit
Admission: RE | Admit: 2015-07-10 | Discharge: 2015-07-10 | Disposition: A | Discharge status: Home / Self Care | Payer: 59 | Source: Ambulatory Visit | Attending: Radiation Oncology | Admitting: Radiation Oncology

## 2015-07-10 ENCOUNTER — Telehealth: Payer: Self-pay | Admitting: Hematology and Oncology

## 2015-07-10 ENCOUNTER — Ambulatory Visit
Admission: RE | Admit: 2015-07-10 | Discharge: 2015-07-10 | Disposition: A | Discharge status: Home / Self Care | Payer: Commercial Managed Care - HMO | Source: Ambulatory Visit | Attending: Radiation Oncology | Admitting: Radiation Oncology

## 2015-07-10 VITALS — BP 121/77 | HR 79 | Temp 97.7°F | Ht 68.0 in | Wt 212.1 lb

## 2015-07-10 DIAGNOSIS — C50411 Malignant neoplasm of upper-outer quadrant of right female breast: Secondary | ICD-10-CM

## 2015-07-10 DIAGNOSIS — Z510 Encounter for antineoplastic radiation therapy: Secondary | ICD-10-CM | POA: Diagnosis not present

## 2015-07-10 NOTE — Progress Notes (Signed)
Ms. Jamie Edwards is her for her 8th fraction to her Right breast. She reports she is doing well, denies any fatigue, or pain. Her anterior right breast is darker in color. BP 121/77 mmHg  Pulse 79  Temp(Src) 97.7 F (36.5 C)  Ht 5\' 8"  (1.727 m)  Wt 212 lb 1.6 oz (96.208 kg)  BMI 32.26 kg/m2  Wt Readings from Last 3 Encounters:  07/10/15 212 lb 1.6 oz (96.208 kg)  07/03/15 209 lb 12.8 oz (95.165 kg)  05/22/15 206 lb 1.6 oz (93.486 kg)

## 2015-07-10 NOTE — Progress Notes (Signed)
   Weekly Management Note:  Outpatient    ICD-9-CM ICD-10-CM   1. Breast cancer of upper-outer quadrant of right female breast ([place]) 174.4 C50.411     Current Dose:  14.4 Gy  Projected Dose: 50.4 Gy initial  Narrative:  The patient presents for routine under treatment assessment.  CBCT/MVCT images/Port film x-rays were reviewed.  The chart was checked.   NAD, doing well, no new complaints  Physical Findings:  height is 5\' 8"  (1.727 m) and weight is 212 lb 1.6 oz (96.208 kg). Her temperature is 97.7 F (36.5 C). Her blood pressure is 121/77 and her pulse is 79.   Wt Readings from Last 3 Encounters:  07/10/15 212 lb 1.6 oz (96.208 kg)  07/03/15 209 lb 12.8 oz (95.165 kg)  05/22/15 206 lb 1.6 oz (93.486 kg)   NAD, slight hyperpigmentation over right chest wall so far  Impression:  The patient is tolerating radiotherapy.  Plan:  Continue radiotherapy as planned.    ________________________________   Eppie Gibson, M.D.

## 2015-07-10 NOTE — Telephone Encounter (Signed)
Patient called in to reschedule her visit until after she is done with rad onc

## 2015-07-11 ENCOUNTER — Ambulatory Visit
Admission: RE | Admit: 2015-07-11 | Discharge: 2015-07-11 | Disposition: A | Discharge status: Home / Self Care | Payer: Commercial Managed Care - HMO | Source: Ambulatory Visit | Attending: Radiation Oncology | Admitting: Radiation Oncology

## 2015-07-11 DIAGNOSIS — Z510 Encounter for antineoplastic radiation therapy: Secondary | ICD-10-CM | POA: Diagnosis not present

## 2015-07-12 ENCOUNTER — Ambulatory Visit: Payer: Commercial Managed Care - HMO | Attending: Radiation Oncology

## 2015-07-12 ENCOUNTER — Ambulatory Visit
Admission: RE | Admit: 2015-07-12 | Discharge: 2015-07-12 | Disposition: A | Discharge status: Home / Self Care | Payer: Commercial Managed Care - HMO | Source: Ambulatory Visit | Attending: Radiation Oncology | Admitting: Radiation Oncology

## 2015-07-12 ENCOUNTER — Telehealth: Payer: Self-pay | Admitting: *Deleted

## 2015-07-12 DIAGNOSIS — Z9189 Other specified personal risk factors, not elsewhere classified: Secondary | ICD-10-CM | POA: Diagnosis present

## 2015-07-12 DIAGNOSIS — M25611 Stiffness of right shoulder, not elsewhere classified: Secondary | ICD-10-CM

## 2015-07-12 DIAGNOSIS — Z510 Encounter for antineoplastic radiation therapy: Secondary | ICD-10-CM | POA: Diagnosis not present

## 2015-07-12 DIAGNOSIS — C50411 Malignant neoplasm of upper-outer quadrant of right female breast: Secondary | ICD-10-CM | POA: Insufficient documentation

## 2015-07-12 DIAGNOSIS — M25612 Stiffness of left shoulder, not elsewhere classified: Secondary | ICD-10-CM | POA: Diagnosis present

## 2015-07-12 NOTE — Telephone Encounter (Signed)
Message left on voicemail requesting return call with specific questions if Triage voicemail received again.  Will notify provider and await return call if needed.  Tried to call mobile number three times and received  Fast busy signal.

## 2015-07-12 NOTE — Telephone Encounter (Signed)
Let pt know ok to have flu vaccine.  Advised no live vaccine.

## 2015-07-12 NOTE — Therapy (Signed)
[facility] [address] [place], [place], [address] Phone: [phone]   Fax:  [phone]  Physical Therapy Treatment  Patient Details  Name: Jamie Edwards MRN: [id number] Date of Birth: [birth year] Referring Provider:  Lonie Peak, MD  Encounter Date: 07/12/2015      PT End of Session - 07/12/15 1356    Visit Number 23   Number of Visits 25   Date for PT Re-Evaluation 07/14/15   PT Start Time 1303   PT Stop Time 1350   PT Time Calculation (min) 47 min   Activity Tolerance Patient limited by pain   Behavior During Therapy [facility] for tasks assessed/performed      Past Medical History  Diagnosis Date  . Increased heart rate 11/2014    pt. put on metoprolol  . Pulmonary embolism 11/2014  . DVT (deep venous thrombosis) 11/2014    BUE  . Cancer of right breast 08/2014    S/P chemo 09/2014-02/02/2015  . Heart murmur     pt. states a doctor stated she had a slight murmur, no studies done. Present PCP has never mention it to her.    Past Surgical History  Procedure Laterality Date  . Mouth surgery      implants  . Portacath placement Right 08/31/2014    Procedure: INSERTION PORT-A-CATH WITH ULTRA SOUND GUIDENCE;  Surgeon: Harriette Bouillon, MD;  Location: [facility];  Service: General;  Laterality: Right;  . Mastectomy complete / simple w/ sentinel node biopsy Right 04/12/2015  . Mastectomy complete / simple Left 04/12/2015    PROPHYLATIC  . Breast reconstruction with placement of tissue expander and flex hd (acellular hydrated dermis) Bilateral 04/12/2015  . Port-a-cath removal  04/12/2015  . Breast biopsy Right 07/2014  . Simple mastectomy with axillary sentinel node biopsy Bilateral 04/12/2015    Procedure: BILATERAL TOTAL MASTECTOMY WITH RIGHT AXILLARY SENTINEL LYMPH  NODE BIOPSY, POSSIBLE AXILLARY DISSECTION, LEFT PROPHYLATIC;  Surgeon: Glenna Fellows, MD;  Location: [facility] OR;  Service: General;  Laterality:  Bilateral;  . Port-a-cath removal Right 04/12/2015    Procedure: REMOVAL PORT-A-CATH;  Surgeon: Glenna Fellows, MD;  Location: [facility] OR;  Service: General;  Laterality: Right;  . Breast reconstruction with placement of tissue expander and flex hd (acellular hydrated dermis) Bilateral 04/12/2015    Procedure: IMMEDIATE BILATERAL BREAST RECONSTRUCTION WITH PLACEMENT OF TISSUE EXPANDER AND FLEX HD (ACELLULAR HYDRATED DERMIS);  Surgeon: Wayland Denis, DO;  Location: [facility] OR;  Service: Plastics;  Laterality: Bilateral;    There were no vitals filed for this visit.  Visit Diagnosis:  At risk for lymphedema  Stiffness of shoulder joint, right  Stiffness of shoulder joint, left  Cancer of upper-outer quadrant of female breast, right      Subjective Assessment - 07/12/15 1306    Subjective Feeling good, just same tightness at my Rt axilla from radiation. But they just keep taping it in place and thats going fine.    Currently in Pain? No/denies                         [facility] Adult PT Treatment/Exercise - 07/12/15 0001    Shoulder Exercises: Pulleys   Flexion 2 minutes   ABduction 2 minutes   Shoulder Exercises: Therapy Ball   Flexion 10 reps  Roll yellow ball up wall   Shoulder Exercises: ROM/Strengthening   Other ROM/Strengthening Exercises Modified downward dog on wall 3 reps for 5 sec each  Manual Therapy   Passive ROM in supine for right shoulder IR, ER, abduction, and flexion with some contract/relax (for ER and flexion); flexion with ER   Other Manual Therapy in left sidelying, gentle soft tissue work to right medial scapula border for muscle relaxation and pain relief                   Short Term Clinic Goals - 06/20/15 1016    CC Short Term Goal  #1   Title Patient with verbalize an understanding of lymphedema risk reduction precautions   Status Achieved   CC Short Term Goal  #2   Title Patient will improve right  shoulder abduction to 120 degrees so  that she can achieve position needed for radiation therapy.  90 degrees and pt still with painful compensations at end ROM 06/20/15   Status On-going             [facility] Goals - 06/20/15 1016    CC Long Term Goal  #1   Title Patient will report a decrease in pain by 50% so they can perform daily activities with greater ease   Status On-going   CC Long Term Goal  #2   Title Patient will be independent in a home exercise program   Status On-going   CC Long Term Goal  #3   Title Patient will be know how to obtain and use compression garments for maintenance phase of treatment   Status On-going   CC Long Term Goal  #4   Title Patient will decrease the DASH score to < 20   to demonstrate increased functional use of upper extremity   CC Long Term Goal  #5   Title Patient will have a circumferential reduction of   1      cm at    10      cm above the             olecranon process   Status Achieved            Plan - 07/12/15 1412    Clinical Impression Statement Pt reported having a sore area at her medial scapula border that really bothered her when she laid down for radiation today so focused on that area at end of session at pt reported it feeling all better after. Increase in ROM visible with contract/relax and pt is tolerating treatment overall well which is allowing her to be able to get thru radiation right now.    Pt will benefit from skilled therapeutic intervention in order to improve on the following deficits Decreased range of motion;Pain;Impaired UE functional use   Rehab Potential Good   Clinical Impairments Affecting Rehab Potential PE earlier this year with UE swelling    PT Frequency 3x / week   PT Duration 4 weeks   PT Treatment/Interventions Therapeutic exercise;Manual techniques;Passive range of motion   PT Next Visit Plan Assess next visit/remeasure ROM; continue to work on range of motion, stretching and progress toward LTG's; continue soft tissue work  to right upper back   Consulted and Agree with Plan of Care Patient        Problem List Patient Active Problem List   Diagnosis Date Noted  . Breast cancer, right breast ([place]) 04/12/2015  . Arm DVT (deep venous thromboembolism), acute ([place]) 12/03/2014  . Antineoplastic chemotherapy induced pancytopenia ([place]) 11/30/2014  . Pulmonary embolism ([place]) 11/29/2014  . Bronchitis 11/25/2014  . Tachycardia 11/25/2014  .  Genetic testing 10/11/2014  . Monoallelic mutation of PALB2 gene 10/11/2014  . Constipation 10/05/2014  . Breast cancer of upper-outer quadrant of right female breast ([place]) 08/10/2014    Otelia Limes, PTA 07/12/2015, 2:16 PM  [place], [place], [address] Phone: [phone]   Fax:  [phone]

## 2015-07-12 NOTE — Telephone Encounter (Signed)
Voicemail received from patient "requesting call from Dr. Lindi Adie or his nurse."  I have questions.  Call [phone] or my mobile [phone]."

## 2015-07-13 ENCOUNTER — Ambulatory Visit: Payer: Commercial Managed Care - HMO

## 2015-07-13 ENCOUNTER — Ambulatory Visit
Admission: RE | Admit: 2015-07-13 | Discharge: 2015-07-13 | Disposition: A | Discharge status: Home / Self Care | Payer: Commercial Managed Care - HMO | Source: Ambulatory Visit | Attending: Radiation Oncology | Admitting: Radiation Oncology

## 2015-07-13 DIAGNOSIS — C50411 Malignant neoplasm of upper-outer quadrant of right female breast: Secondary | ICD-10-CM

## 2015-07-13 DIAGNOSIS — Z9189 Other specified personal risk factors, not elsewhere classified: Secondary | ICD-10-CM

## 2015-07-13 DIAGNOSIS — M25611 Stiffness of right shoulder, not elsewhere classified: Secondary | ICD-10-CM

## 2015-07-13 DIAGNOSIS — M25612 Stiffness of left shoulder, not elsewhere classified: Secondary | ICD-10-CM

## 2015-07-13 DIAGNOSIS — Z510 Encounter for antineoplastic radiation therapy: Secondary | ICD-10-CM | POA: Diagnosis not present

## 2015-07-13 NOTE — Therapy (Signed)
Jamie Edwards, [place], [address] Phone: [phone]   Fax:  [phone]  Physical Therapy Treatment  Patient Details  Name: Jamie Edwards MRN: [id number] Date of Birth: [birth year] Referring Provider:  Eppie Gibson, MD  Encounter Date: 07/13/2015      PT End of Session - 07/13/15 0924    Visit Number 24   Number of Visits 25   Date for PT Re-Evaluation 07/14/15   PT Start Time 0759   PT Stop Time 0847   PT Time Calculation (min) 48 min   Activity Tolerance Patient limited by pain   Behavior During Therapy [facility] for tasks assessed/performed      Past Medical History  Diagnosis Date  . Increased heart rate 11/2014    pt. put on metoprolol  . Pulmonary embolism 11/2014  . DVT (deep venous thrombosis) 11/2014    BUE  . Cancer of right breast 08/2014    S/P chemo 09/2014-02/02/2015  . Heart murmur     pt. states a doctor stated she had a slight murmur, no studies done. Present PCP has never mention it to her.    Past Surgical History  Procedure Laterality Date  . Mouth surgery      implants  . Portacath placement Right 08/31/2014    Procedure: INSERTION PORT-A-CATH WITH ULTRA SOUND GUIDENCE;  Surgeon: Erroll Luna, MD;  Location: [place];  Service: General;  Laterality: Right;  . Mastectomy complete / simple w/ sentinel node biopsy Right 04/12/2015  . Mastectomy complete / simple Left 04/12/2015    PROPHYLATIC  . Breast reconstruction with placement of tissue expander and flex hd (acellular hydrated dermis) Bilateral 04/12/2015  . Port-a-cath removal  04/12/2015  . Breast biopsy Right 07/2014  . Simple mastectomy with axillary sentinel node biopsy Bilateral 04/12/2015    Procedure: BILATERAL TOTAL MASTECTOMY WITH RIGHT AXILLARY SENTINEL LYMPH  NODE BIOPSY, POSSIBLE AXILLARY DISSECTION, LEFT PROPHYLATIC;  Surgeon: Excell Seltzer, MD;  Location: [place];  Service: General;  Laterality:  Bilateral;  . Port-a-cath removal Right 04/12/2015    Procedure: REMOVAL PORT-A-CATH;  Surgeon: Excell Seltzer, MD;  Location: [facility];  Service: General;  Laterality: Right;  . Breast reconstruction with placement of tissue expander and flex hd (acellular hydrated dermis) Bilateral 04/12/2015    Procedure: IMMEDIATE BILATERAL BREAST RECONSTRUCTION WITH PLACEMENT OF TISSUE EXPANDER AND FLEX HD (ACELLULAR HYDRATED DERMIS);  Surgeon: Theodoro Kos, DO;  Location: [facility];  Service: Plastics;  Laterality: Bilateral;    There were no vitals filed for this visit.  Visit Diagnosis:  At risk for lymphedema  Stiffness of shoulder joint, right  Stiffness of shoulder joint, left  Cancer of upper-outer quadrant of female breast, right      Subjective Assessment - 07/13/15 0801    Subjective Doing fine, felt good after last visit.    Currently in Pain? No/denies            [facility] PT Assessment - 07/13/15 0001    AROM   Right Shoulder Flexion 109 Degrees   Right Shoulder ABduction 95 Degrees              Quick Dash - 07/13/15 0001    Open a tight or new jar Mild difficulty   Do heavy household chores (wash walls, wash floors) Moderate difficulty   Carry a shopping bag or briefcase Moderate difficulty   Wash your back Severe difficulty   Use a knife to cut food Mild difficulty  Recreational activities in which you take some force or impact through your arm, shoulder, or hand (golf, hammering, tennis) Severe difficulty   During the past week, to what extent has your arm, shoulder or hand problem interfered with your normal social activities with family, friends, neighbors, or groups? Modererately   During the past week, to what extent has your arm, shoulder or hand problem limited your work or other regular daily activities Modererately   Arm, shoulder, or hand pain. Severe   Tingling (pins and needles) in your arm, shoulder, or hand Mild   Difficulty Sleeping Mild difficulty   DASH  Score 47.73 %               [facility] Adult PT Treatment/Exercise - 07/13/15 0001    Shoulder Exercises: Pulleys   Flexion 2 minutes   ABduction 2 minutes   ABduction Limitations Cuing to decrease scapular compensations   Shoulder Exercises: Therapy Ball   Flexion 10 reps  Roll yellow ball up wall    Flexion Limitations Had pt use forearms only with hands clasped together to limit AROM helping pt to limit compensations getting a more effective stretch   Shoulder Exercises: ROM/Strengthening   Other ROM/Strengthening Exercises Modified downward dog on wall 3 reps for 5 sec each   Other ROM/Strengthening Exercises Finger ladder for Rt UE abduction slowly to work on decreasing scapular compensations; Also instructed p tin cane flexion with contract/relax   Manual Therapy   Passive ROM in supine for right shoulder IR, ER, abduction, and flexion with some contract/relax (for ER and flexion); flexion with ER   Other Manual Therapy in left sidelying, gentle soft tissue work to right medial scapula border for muscle relaxation and pain relief                PT Education - 07/13/15 0851    Education provided Yes   Education Details Cane exercises   Person(s) Educated Patient   Methods Explanation;Demonstration;Handout   Comprehension Verbalized understanding;Returned demonstration;Need further instruction           Short Term Clinic Goals - 07/13/15 0815    CC Short Term Goal  #2   Title Patient will improve right  shoulder abduction to 120 degrees so that she can achieve position needed for radiation therapy.  95 degrees attained thus far 07/13/15   Status On-going             [facility] Goals - 07/13/15 0803    CC Long Term Goal  #1   Title Patient will report a decrease in pain by 50% so they can perform daily activities with greater ease  80% improement reported 07/13/15   Status Achieved   CC Long Term Goal  #2   Title Patient will be independent in a  home exercise program   Status On-going   CC Long Term Goal  #3   Title Patient will be know how to obtain and use compression garments for maintenance phase of treatment   Status On-going   CC Long Term Goal  #4   Title Patient will decrease the DASH score to < 20   to demonstrate increased functional use of upper extremity  Pt scored 47.73 today 07/13/15   Status On-going            Plan - 07/13/15 0925    Clinical Impression Statement Instructed pt in cane exercises today to include contract/relax as she reports this feels most beneficial, will need soe  follow up with instruction though of the stretch portion. Returned correct demo of contract/relax. Pts AROM measurements are unchanged from 2 weeks ago however her pain is reportedly becoming less slowly with end ROM stretching, especially when contract/relax is incorporated into PROM.   Pt will benefit from skilled therapeutic intervention in order to improve on the following deficits Decreased range of motion;Pain;Impaired UE functional use   Rehab Potential Good   Clinical Impairments Affecting Rehab Potential PE earlier this year with UE swelling    PT Frequency 3x / week   PT Duration 4 weeks   PT Treatment/Interventions Therapeutic exercise;Manual techniques;Passive range of motion   PT Next Visit Plan Renewal required next visit; continue to work on range of motion, stretching and progress toward LTG's; continue soft tissue work to right upper back   Consulted and Agree with Plan of Care Patient        Problem List Patient Active Problem List   Diagnosis Date Noted  . Breast cancer, right breast ([place]) 04/12/2015  . Arm DVT (deep venous thromboembolism), acute ([place]) 12/03/2014  . Antineoplastic chemotherapy induced pancytopenia ([place]) 11/30/2014  . Pulmonary embolism ([place]) 11/29/2014  . Bronchitis 11/25/2014  . Tachycardia 11/25/2014  . Genetic testing 10/11/2014  . Monoallelic mutation of PALB2 gene 10/11/2014  .  Constipation 10/05/2014  . Breast cancer of upper-outer quadrant of right female breast ([place]) 08/10/2014    Otelia Limes, PTA 07/13/2015, 9:28 AM  [place], [place], [address] Phone: [phone]   Fax:  [phone]

## 2015-07-13 NOTE — Patient Instructions (Signed)
Cane Exercise: Flexion    Lie on back, holding cane above chest. Keeping arms as straight as possible, lower cane toward floor beyond head. Hold ____ seconds. Repeat __5-10__ times. Do _2-3___ sessions per day.  Also perform contract/relax once in stretch push down with Rt resisting with Lt for 7 sec then hold new stretch 10 sec, do 3 reps.   Issue paper handout for abduction, ER, and IR behind back http://[email or web address].[email or web address]   Copyright  VHI. All rights reserved.

## 2015-07-14 ENCOUNTER — Ambulatory Visit: Payer: Commercial Managed Care - HMO | Admitting: Physical Therapy

## 2015-07-14 ENCOUNTER — Ambulatory Visit
Admission: RE | Admit: 2015-07-14 | Discharge: 2015-07-14 | Disposition: A | Discharge status: Home / Self Care | Payer: Commercial Managed Care - HMO | Source: Ambulatory Visit | Attending: Radiation Oncology | Admitting: Radiation Oncology

## 2015-07-14 DIAGNOSIS — Z510 Encounter for antineoplastic radiation therapy: Secondary | ICD-10-CM | POA: Diagnosis not present

## 2015-07-17 ENCOUNTER — Ambulatory Visit
Admission: RE | Admit: 2015-07-17 | Discharge: 2015-07-17 | Disposition: A | Discharge status: Home / Self Care | Payer: 59 | Source: Ambulatory Visit | Attending: Radiation Oncology | Admitting: Radiation Oncology

## 2015-07-17 ENCOUNTER — Ambulatory Visit
Admission: RE | Admit: 2015-07-17 | Discharge: 2015-07-17 | Disposition: A | Discharge status: Home / Self Care | Payer: Commercial Managed Care - HMO | Source: Ambulatory Visit | Attending: Radiation Oncology | Admitting: Radiation Oncology

## 2015-07-17 VITALS — BP 111/72 | HR 79 | Temp 98.6°F | Ht 68.0 in | Wt 211.0 lb

## 2015-07-17 DIAGNOSIS — C50411 Malignant neoplasm of upper-outer quadrant of right female breast: Secondary | ICD-10-CM

## 2015-07-17 DIAGNOSIS — Z510 Encounter for antineoplastic radiation therapy: Secondary | ICD-10-CM | POA: Diagnosis not present

## 2015-07-17 NOTE — Progress Notes (Signed)
   Weekly Management Note:  Outpatient    ICD-9-CM ICD-10-CM   1. Breast cancer of upper-outer quadrant of right female breast ([place]) 174.4 C50.411     Current Dose:  23.4 Gy  Projected Dose: 60.4 Gy   Narrative:  The patient presents for routine under treatment assessment.  CBCT/MVCT images/Port film x-rays were reviewed.  The chart was checked. No complaints  Physical Findings:  height is 5\' 8"  (1.727 m) and weight is 211 lb (95.709 kg). Her temperature is 98.6 F (37 C). Her blood pressure is 111/72 and her pulse is 79.   Wt Readings from Last 3 Encounters:  07/17/15 211 lb (95.709 kg)  07/10/15 212 lb 1.6 oz (96.208 kg)  07/03/15 209 lb 12.8 oz (95.165 kg)   Hyperpigmentation over right chest wall   Impression:  The patient is tolerating radiotherapy.  Plan:  Continue radiotherapy as planned.    ________________________________   Eppie Gibson, M.D.

## 2015-07-17 NOTE — Progress Notes (Signed)
Jamie Edwards is here for her 13th fraction of radiation to her right breast. 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 The skin on her right breast has dark discoloration to the anterior section. She denies any pain, or tenderness in this area, and states she is using the radiaplex cream as directed.  BP 111/72 mmHg  Pulse 79  Temp(Src) 98.6 F (37 C)  Ht 5\' 8"  (1.727 m)  Wt 211 lb (95.709 kg)  BMI 32.09 kg/m2  Wt Readings from Last 3 Encounters:  07/17/15 211 lb (95.709 kg)  07/10/15 212 lb 1.6 oz (96.208 kg)  07/03/15 209 lb 12.8 oz (95.165 kg)

## 2015-07-18 ENCOUNTER — Ambulatory Visit
Admission: RE | Admit: 2015-07-18 | Discharge: 2015-07-18 | Disposition: A | Discharge status: Home / Self Care | Payer: Commercial Managed Care - HMO | Source: Ambulatory Visit | Attending: Radiation Oncology | Admitting: Radiation Oncology

## 2015-07-18 ENCOUNTER — Ambulatory Visit: Payer: Commercial Managed Care - HMO

## 2015-07-18 DIAGNOSIS — Z510 Encounter for antineoplastic radiation therapy: Secondary | ICD-10-CM | POA: Diagnosis not present

## 2015-07-18 DIAGNOSIS — Z9189 Other specified personal risk factors, not elsewhere classified: Secondary | ICD-10-CM

## 2015-07-18 DIAGNOSIS — M25611 Stiffness of right shoulder, not elsewhere classified: Secondary | ICD-10-CM

## 2015-07-18 NOTE — Therapy (Signed)
[place], [place], [address] Phone: [phone]   Fax:  [phone]  Physical Therapy Treatment  Patient Details  Name: Jamie Edwards MRN: [id number] Date of Birth: 09-14-1971 Referring Provider:  Eppie Gibson, MD  Encounter Date: 07/18/2015      PT End of Session - 07/18/15 0817    Visit Number 25   Number of Visits 25   Date for PT Re-Evaluation 09/12/15   PT Start Time 0805   PT Stop Time 0848   PT Time Calculation (min) 43 min   Activity Tolerance Patient limited by pain   Behavior During Therapy [facility] for tasks assessed/performed      Past Medical History  Diagnosis Date  . Increased heart rate 11/2014    pt. put on metoprolol  . Pulmonary embolism 11/2014  . DVT (deep venous thrombosis) 11/2014    BUE  . Cancer of right breast 08/2014    S/P chemo 09/2014-02/02/2015  . Heart murmur     pt. states a doctor stated she had a slight murmur, no studies done. Present PCP has never mention it to her.    Past Surgical History  Procedure Laterality Date  . Mouth surgery      implants  . Portacath placement Right 08/31/2014    Procedure: INSERTION PORT-A-CATH WITH ULTRA SOUND GUIDENCE;  Surgeon: Erroll Luna, MD;  Location: [place];  Service: General;  Laterality: Right;  . Mastectomy complete / simple w/ sentinel node biopsy Right 04/12/2015  . Mastectomy complete / simple Left 04/12/2015    PROPHYLATIC  . Breast reconstruction with placement of tissue expander and flex hd (acellular hydrated dermis) Bilateral 04/12/2015  . Port-a-cath removal  04/12/2015  . Breast biopsy Right 07/2014  . Simple mastectomy with axillary sentinel node biopsy Bilateral 04/12/2015    Procedure: BILATERAL TOTAL MASTECTOMY WITH RIGHT AXILLARY SENTINEL LYMPH  NODE BIOPSY, POSSIBLE AXILLARY DISSECTION, LEFT PROPHYLATIC;  Surgeon: Excell Seltzer, MD;  Location: [place];  Service: General;   Laterality: Bilateral;  . Port-a-cath removal Right 04/12/2015    Procedure: REMOVAL PORT-A-CATH;  Surgeon: Excell Seltzer, MD;  Location: [place];  Service: General;  Laterality: Right;  . Breast reconstruction with placement of tissue expander and flex hd (acellular hydrated dermis) Bilateral 04/12/2015    Procedure: IMMEDIATE BILATERAL BREAST RECONSTRUCTION WITH PLACEMENT OF TISSUE EXPANDER AND FLEX HD (ACELLULAR HYDRATED DERMIS);  Surgeon: Theodoro Kos, DO;  Location: [place];  Service: Plastics;  Laterality: Bilateral;    There were no vitals filed for this visit.  Visit Diagnosis:  At risk for lymphedema  Stiffness of shoulder joint, right      Subjective Assessment - 07/18/15 0810    Subjective Doing good, my range is slowly getting better. Some days though it just feels tighter than others, like today bc it's so cold this morning. Still able to get into position for radiation with a little assistance.    Currently in Pain? No/denies            [facility] PT Assessment - 07/18/15 0001    AROM   Right Shoulder Flexion 119 Degrees  In Supine after contract/relax                     [facility] Adult PT Treatment/Exercise - 07/18/15 0001    Shoulder Exercises: Pulleys   Flexion 2 minutes   ABduction 2 minutes   Shoulder Exercises: ROM/Strengthening   Other ROM/Strengthening Exercises Modified downward  dog on wall 5 reps for 5 sec each   Other ROM/Strengthening Exercises Brought UE Ranger over and placed on 6" step for flexion and abduction 5 reps each, 5 second holds   Manual Therapy   Passive ROM In supine for right shoulder IR, ER, abduction, and flexion with some contract/relax (for ER and flexion); flexion with ER   Other Manual Therapy In left sidelying, gentle soft tissue work to right medial scapula border for muscle relaxation and pain relief                   Short Term Clinic Goals - 07/18/15 0857    CC Short Term Goal  #1   Title Patient with  verbalize an understanding of lymphedema risk reduction precautions   Status Achieved   CC Short Term Goal  #2   Title Patient will improve right  shoulder abduction to 120 degrees so that she can achieve position needed for radiation therapy.  92 degrees today in supine AAROM, but pt has attrained up to 95 degrees   Status On-going             [facility] Goals - 07/18/15 0858    CC Long Term Goal  #1   Title Patient will report a decrease in pain by 50% so they can perform daily activities with greater ease  80% improvement reported    Status Achieved   CC Long Term Goal  #2   Title Patient will be independent in a home exercise program   Status On-going   CC Long Term Goal  #3   Title Patient will be know how to obtain and use compression garments for maintenance phase of treatment   Status On-going   CC Long Term Goal  #4   Title Patient will decrease the DASH score to < 20   to demonstrate increased functional use of upper extremity  Improved to 47.73 on 07/13/15   Status On-going   CC Long Term Goal  #5   Title Patient will have a circumferential reduction of   1      cm at    10      cm above the             olecranon process   Status Achieved            Plan - 07/18/15 0850    Clinical Impression Statement Jamie Edwards has been doing very well with being compliant with her exercises. Her AROM in supine was improved today from last week. Pt would like ot cont therapy but decrease to 1x/week as this is better with her work schedule.    Pt will benefit from skilled therapeutic intervention in order to improve on the following deficits Decreased range of motion;Pain;Impaired UE functional use   Rehab Potential Good   Clinical Impairments Affecting Rehab Potential PE earlier this year with UE swelling    PT Frequency 1x / week   PT Duration 8 weeks   PT Treatment/Interventions Therapeutic exercise;Manual techniques;Passive range of motion   PT Next Visit Plan  Renewal done this visit; continue to work on range of motion, stretching and progress toward LTG's; continue soft tissue work to right upper back   Consulted and Agree with Plan of Care Patient        Problem List Patient Active Problem List   Diagnosis Date Noted  . Breast cancer, right breast ([place]) 04/12/2015  . Arm DVT (deep venous  thromboembolism), acute ([place]) 12/03/2014  . Antineoplastic chemotherapy induced pancytopenia ([place]) 11/30/2014  . Pulmonary embolism ([place]) 11/29/2014  . Bronchitis 11/25/2014  . Tachycardia 11/25/2014  . Genetic testing 10/11/2014  . Monoallelic mutation of PALB2 gene 10/11/2014  . Constipation 10/05/2014  . Breast cancer of upper-outer quadrant of right female breast ([place]) 08/10/2014    Otelia Limes, PTA 07/18/2015, 8:59 AM  [place], [place], [address] Phone: [phone]   Fax:  [phone]

## 2015-07-19 ENCOUNTER — Ambulatory Visit
Admission: RE | Admit: 2015-07-19 | Discharge: 2015-07-19 | Disposition: A | Discharge status: Home / Self Care | Payer: Commercial Managed Care - HMO | Source: Ambulatory Visit | Attending: Radiation Oncology | Admitting: Radiation Oncology

## 2015-07-19 DIAGNOSIS — Z510 Encounter for antineoplastic radiation therapy: Secondary | ICD-10-CM | POA: Diagnosis not present

## 2015-07-20 ENCOUNTER — Ambulatory Visit: Payer: 59 | Admitting: Hematology and Oncology

## 2015-07-20 ENCOUNTER — Ambulatory Visit
Admission: RE | Admit: 2015-07-20 | Discharge: 2015-07-20 | Disposition: A | Discharge status: Home / Self Care | Payer: Commercial Managed Care - HMO | Source: Ambulatory Visit | Attending: Radiation Oncology | Admitting: Radiation Oncology

## 2015-07-20 ENCOUNTER — Other Ambulatory Visit: Payer: 59

## 2015-07-20 DIAGNOSIS — Z510 Encounter for antineoplastic radiation therapy: Secondary | ICD-10-CM | POA: Diagnosis not present

## 2015-07-21 ENCOUNTER — Other Ambulatory Visit: Payer: Self-pay | Admitting: Hematology and Oncology

## 2015-07-21 ENCOUNTER — Ambulatory Visit
Admission: RE | Admit: 2015-07-21 | Discharge: 2015-07-21 | Disposition: A | Discharge status: Home / Self Care | Payer: Commercial Managed Care - HMO | Source: Ambulatory Visit | Attending: Radiation Oncology | Admitting: Radiation Oncology

## 2015-07-21 DIAGNOSIS — C50411 Malignant neoplasm of upper-outer quadrant of right female breast: Secondary | ICD-10-CM

## 2015-07-21 DIAGNOSIS — Z510 Encounter for antineoplastic radiation therapy: Secondary | ICD-10-CM | POA: Diagnosis not present

## 2015-07-24 ENCOUNTER — Encounter: Payer: Self-pay | Admitting: Radiation Oncology

## 2015-07-24 ENCOUNTER — Ambulatory Visit: Payer: 59 | Admitting: Hematology and Oncology

## 2015-07-24 ENCOUNTER — Ambulatory Visit
Admission: RE | Admit: 2015-07-24 | Discharge: 2015-07-24 | Disposition: A | Discharge status: Home / Self Care | Payer: Commercial Managed Care - HMO | Source: Ambulatory Visit | Attending: Radiation Oncology | Admitting: Radiation Oncology

## 2015-07-24 ENCOUNTER — Other Ambulatory Visit: Payer: 59

## 2015-07-24 VITALS — BP 108/71 | HR 90 | Temp 98.5°F | Ht 68.0 in | Wt 204.7 lb

## 2015-07-24 DIAGNOSIS — C50411 Malignant neoplasm of upper-outer quadrant of right female breast: Secondary | ICD-10-CM

## 2015-07-24 DIAGNOSIS — Z510 Encounter for antineoplastic radiation therapy: Secondary | ICD-10-CM | POA: Diagnosis not present

## 2015-07-24 NOTE — Progress Notes (Signed)
Jamie Edwards presents for her 18th fraction of radiation to her right breast. She reports her energy level is good. Her right breast is darker in color all over. She states her pain is underneath her breast which she rates at a 2/10. She states it is a nagging, persistent, pain especially when she is wearing a bra. The skin is dry, but no open areas are visible at this time. She is using the radiaplex cream as directed.   BP 108/71 mmHg  Pulse 90  Temp(Src) 98.5 F (36.9 C)  Ht 5\' 8"  (1.727 m)  Wt 204 lb 11.2 oz (92.851 kg)  BMI 31.13 kg/m2   Wt Readings from Last 3 Encounters:  07/24/15 204 lb 11.2 oz (92.851 kg)  07/17/15 211 lb (95.709 kg)  07/10/15 212 lb 1.6 oz (96.208 kg)

## 2015-07-24 NOTE — Progress Notes (Signed)
   Weekly Management Note:  Outpatient    ICD-9-CM ICD-10-CM   1. Breast cancer of upper-outer quadrant of right female breast ([place]) 174.4 C50.411     Current Dose:  32.4 Gy  Projected Dose: 60.4 Gy   Narrative:  The patient presents for routine under treatment assessment.  CBCT/MVCT images/Port film x-rays were reviewed.  The chart was checked. Soreness in lower right chest wall, 2/10  Physical Findings:  height is 5\' 8"  (1.727 m) and weight is 204 lb 11.2 oz (92.851 kg). Her temperature is 98.5 F (36.9 C). Her blood pressure is 108/71 and her pulse is 90.   Wt Readings from Last 3 Encounters:  07/24/15 204 lb 11.2 oz (92.851 kg)  07/17/15 211 lb (95.709 kg)  07/10/15 212 lb 1.6 oz (96.208 kg)   Skin hyperpigmented over right chest wall, intact  Impression:  The patient is tolerating radiotherapy.  Plan:  Continue radiotherapy as planned.    ________________________________   Eppie Gibson, M.D.

## 2015-07-25 ENCOUNTER — Ambulatory Visit
Admission: RE | Admit: 2015-07-25 | Discharge: 2015-07-25 | Disposition: A | Discharge status: Home / Self Care | Payer: Commercial Managed Care - HMO | Source: Ambulatory Visit | Attending: Radiation Oncology | Admitting: Radiation Oncology

## 2015-07-25 DIAGNOSIS — Z510 Encounter for antineoplastic radiation therapy: Secondary | ICD-10-CM | POA: Diagnosis not present

## 2015-07-25 DIAGNOSIS — C50411 Malignant neoplasm of upper-outer quadrant of right female breast: Secondary | ICD-10-CM | POA: Diagnosis present

## 2015-07-26 ENCOUNTER — Ambulatory Visit
Admission: RE | Admit: 2015-07-26 | Discharge: 2015-07-26 | Disposition: A | Discharge status: Home / Self Care | Payer: Commercial Managed Care - HMO | Source: Ambulatory Visit | Attending: Radiation Oncology | Admitting: Radiation Oncology

## 2015-07-26 DIAGNOSIS — Z510 Encounter for antineoplastic radiation therapy: Secondary | ICD-10-CM | POA: Diagnosis not present

## 2015-07-27 ENCOUNTER — Ambulatory Visit
Admission: RE | Admit: 2015-07-27 | Discharge: 2015-07-27 | Disposition: A | Discharge status: Home / Self Care | Payer: Commercial Managed Care - HMO | Source: Ambulatory Visit | Attending: Radiation Oncology | Admitting: Radiation Oncology

## 2015-07-27 DIAGNOSIS — Z510 Encounter for antineoplastic radiation therapy: Secondary | ICD-10-CM | POA: Diagnosis not present

## 2015-07-28 ENCOUNTER — Ambulatory Visit
Admission: RE | Admit: 2015-07-28 | Discharge: 2015-07-28 | Disposition: A | Discharge status: Home / Self Care | Payer: Commercial Managed Care - HMO | Source: Ambulatory Visit | Attending: Radiation Oncology | Admitting: Radiation Oncology

## 2015-07-28 DIAGNOSIS — Z510 Encounter for antineoplastic radiation therapy: Secondary | ICD-10-CM | POA: Diagnosis not present

## 2015-07-29 DIAGNOSIS — Z86711 Personal history of pulmonary embolism: Secondary | ICD-10-CM | POA: Insufficient documentation

## 2015-07-29 DIAGNOSIS — Z9109 Other allergy status, other than to drugs and biological substances: Secondary | ICD-10-CM | POA: Insufficient documentation

## 2015-07-29 DIAGNOSIS — C50411 Malignant neoplasm of upper-outer quadrant of right female breast: Secondary | ICD-10-CM | POA: Insufficient documentation

## 2015-07-29 DIAGNOSIS — Z510 Encounter for antineoplastic radiation therapy: Secondary | ICD-10-CM | POA: Insufficient documentation

## 2015-07-29 DIAGNOSIS — Z86718 Personal history of other venous thrombosis and embolism: Secondary | ICD-10-CM | POA: Insufficient documentation

## 2015-07-29 DIAGNOSIS — Z7901 Long term (current) use of anticoagulants: Secondary | ICD-10-CM | POA: Insufficient documentation

## 2015-07-29 DIAGNOSIS — Z880 Allergy status to penicillin: Secondary | ICD-10-CM | POA: Insufficient documentation

## 2015-07-29 DIAGNOSIS — Z881 Allergy status to other antibiotic agents status: Secondary | ICD-10-CM | POA: Insufficient documentation

## 2015-07-31 ENCOUNTER — Ambulatory Visit
Admission: RE | Admit: 2015-07-31 | Discharge: 2015-07-31 | Disposition: A | Discharge status: Home / Self Care | Payer: Commercial Managed Care - HMO | Source: Ambulatory Visit | Attending: Radiation Oncology | Admitting: Radiation Oncology

## 2015-07-31 DIAGNOSIS — C50411 Malignant neoplasm of upper-outer quadrant of right female breast: Secondary | ICD-10-CM | POA: Diagnosis present

## 2015-07-31 DIAGNOSIS — Z880 Allergy status to penicillin: Secondary | ICD-10-CM | POA: Diagnosis not present

## 2015-07-31 DIAGNOSIS — Z7901 Long term (current) use of anticoagulants: Secondary | ICD-10-CM | POA: Diagnosis not present

## 2015-07-31 DIAGNOSIS — Z9109 Other allergy status, other than to drugs and biological substances: Secondary | ICD-10-CM | POA: Diagnosis not present

## 2015-07-31 DIAGNOSIS — Z510 Encounter for antineoplastic radiation therapy: Secondary | ICD-10-CM | POA: Diagnosis present

## 2015-07-31 DIAGNOSIS — Z86718 Personal history of other venous thrombosis and embolism: Secondary | ICD-10-CM | POA: Diagnosis not present

## 2015-07-31 DIAGNOSIS — Z86711 Personal history of pulmonary embolism: Secondary | ICD-10-CM | POA: Diagnosis not present

## 2015-07-31 DIAGNOSIS — Z881 Allergy status to other antibiotic agents status: Secondary | ICD-10-CM | POA: Diagnosis not present

## 2015-07-31 NOTE — Progress Notes (Signed)
   Weekly Management Note:  Outpatient    ICD-9-CM ICD-10-CM   1. Breast cancer of upper-outer quadrant of right female breast ([place]) 174.4 C50.411     Current Dose:  41.4 Gy  Projected Dose: 60.4 Gy   Narrative:  The patient presents for routine under treatment assessment.  CBCT/MVCT images/Port film x-rays were reviewed.  The chart was checked. Soreness in lower right chest wall and back  Physical Findings:  vitals were not taken for this visit.  Wt Readings from Last 3 Encounters:  07/24/15 204 lb 11.2 oz (92.851 kg)  07/17/15 211 lb (95.709 kg)  07/10/15 212 lb 1.6 oz (96.208 kg)   Skin hyperpigmented over right chest wall, dry desquamation  Impression: The patient is tolerating radiotherapy.  Plan:  Continue radiotherapy as planned.  Ibuprofen for pain - also refer for massage therapy.   ________________________________   Eppie Gibson, M.D.

## 2015-08-01 ENCOUNTER — Ambulatory Visit
Admission: RE | Admit: 2015-08-01 | Discharge: 2015-08-01 | Disposition: A | Discharge status: Home / Self Care | Payer: Commercial Managed Care - HMO | Source: Ambulatory Visit | Attending: Radiation Oncology | Admitting: Radiation Oncology

## 2015-08-01 ENCOUNTER — Ambulatory Visit: Payer: Commercial Managed Care - HMO

## 2015-08-01 DIAGNOSIS — Z510 Encounter for antineoplastic radiation therapy: Secondary | ICD-10-CM | POA: Diagnosis not present

## 2015-08-02 ENCOUNTER — Ambulatory Visit
Admission: RE | Admit: 2015-08-02 | Discharge: 2015-08-02 | Disposition: A | Discharge status: Home / Self Care | Payer: Commercial Managed Care - HMO | Source: Ambulatory Visit | Attending: Radiation Oncology | Admitting: Radiation Oncology

## 2015-08-02 DIAGNOSIS — Z510 Encounter for antineoplastic radiation therapy: Secondary | ICD-10-CM | POA: Diagnosis not present

## 2015-08-03 ENCOUNTER — Ambulatory Visit
Admission: RE | Admit: 2015-08-03 | Discharge: 2015-08-03 | Disposition: A | Discharge status: Home / Self Care | Payer: Commercial Managed Care - HMO | Source: Ambulatory Visit | Attending: Radiation Oncology | Admitting: Radiation Oncology

## 2015-08-03 DIAGNOSIS — Z510 Encounter for antineoplastic radiation therapy: Secondary | ICD-10-CM | POA: Diagnosis not present

## 2015-08-04 ENCOUNTER — Telehealth: Payer: Self-pay | Admitting: *Deleted

## 2015-08-04 ENCOUNTER — Encounter: Payer: Self-pay | Admitting: Radiation Oncology

## 2015-08-04 ENCOUNTER — Ambulatory Visit
Admission: RE | Admit: 2015-08-04 | Discharge: 2015-08-04 | Disposition: A | Discharge status: Home / Self Care | Payer: Commercial Managed Care - HMO | Source: Ambulatory Visit | Attending: Radiation Oncology | Admitting: Radiation Oncology

## 2015-08-04 VITALS — BP 148/78 | HR 81 | Temp 97.9°F | Ht 68.0 in | Wt 212.3 lb

## 2015-08-04 DIAGNOSIS — Z510 Encounter for antineoplastic radiation therapy: Secondary | ICD-10-CM | POA: Diagnosis not present

## 2015-08-04 DIAGNOSIS — C50411 Malignant neoplasm of upper-outer quadrant of right female breast: Secondary | ICD-10-CM

## 2015-08-04 NOTE — Progress Notes (Signed)
   Weekly Management Note:  Outpatient    ICD-9-CM ICD-10-CM   1. Breast cancer of upper-outer quadrant of right female breast ([facility]) 174.4 C50.411     Current Dose:  48.6 Gy  Projected Dose: 60.4 Gy   Narrative:  The patient presents for routine under treatment assessment.  CBCT/MVCT images/Port film x-rays were reviewed.  The chart was checked. She reports mild fatigue, but is still working. Her  right breast is hyperpigmented and she is applying radiaplex to this area. She has an area of increased tenderness under her breast mediallly for which she is using neosporin. The area immediately under her axilla is red, moist and painful to touch, she is not putting anything on it at this time. Under that area there is a fold of skin, which also has a moist area, with a scant amount of drainage noted by the nurse that is tan in color. She also reports a odor from this area. She is using [employer] and patting herself dry in that area.   Physical Findings:  height is 5\' 8"  (1.727 m) and weight is 212 lb 4.8 oz (96.299 kg). Her temperature is 97.9 F (36.6 C). Her blood pressure is 148/78 and her pulse is 81.   Wt Readings from Last 3 Encounters:  08/04/15 212 lb 4.8 oz (96.299 kg)  07/24/15 204 lb 11.2 oz (92.851 kg)  07/17/15 211 lb (95.709 kg)   She has moist desquamation in the right axilla. No sign of active infection. Skin over her right chest wall shows dry desquamation and hyperpigmentation.  Impression: The patient is tolerating radiotherapy.  Plan:  Continue radiotherapy as planned. I recommended applying neosporin throughout the areas of moist desquamation and radiaplex in areas of dry desquamation.  This document serves as a record of services personally performed by Eppie Gibson, MD. It was created on her behalf by Darcus Austin, a trained medical scribe. The creation of this record is based on the scribe's personal observations and the provider's statements to them. This document has  been checked and approved by the attending provider.     ________________________________   Eppie Gibson, M.D.

## 2015-08-04 NOTE — Telephone Encounter (Signed)
Called patient to inform her that Dr. Isidore Moos will see her after tx. Today, lvm for a return call

## 2015-08-04 NOTE — Progress Notes (Signed)
Jamie Edwards is here for her under treat visit. She reports mild fatigue, but is still working. Her entire Right breast is hyperpigmented and she is using radiaplex to this area. She has an area of increased tenderness under her breast mediallly which she is using neosporin on. The area immediately under her axilla is red, moist and painful to touch, she is not putting anything on it at this time. Under that area there is a fold of skin, which also has a moist area, with a scant amount of drainage noted that is tan in color. She also reports a odor from this area. She is using the [employer], and patting herself dry in that area.   BP 148/78 mmHg  Pulse 81  Temp(Src) 97.9 F (36.6 C)  Ht 5\' 8"  (1.727 m)  Wt 212 lb 4.8 oz (96.299 kg)  BMI 32.29 kg/m2

## 2015-08-06 ENCOUNTER — Ambulatory Visit: Admission: RE | Admit: 2015-08-06 | Payer: Commercial Managed Care - HMO | Source: Ambulatory Visit

## 2015-08-07 ENCOUNTER — Ambulatory Visit: Payer: Commercial Managed Care - HMO

## 2015-08-07 ENCOUNTER — Ambulatory Visit
Admission: RE | Admit: 2015-08-07 | Discharge: 2015-08-07 | Disposition: A | Discharge status: Home / Self Care | Payer: Commercial Managed Care - HMO | Source: Ambulatory Visit | Attending: Radiation Oncology | Admitting: Radiation Oncology

## 2015-08-07 ENCOUNTER — Ambulatory Visit: Payer: Commercial Managed Care - HMO | Attending: Radiation Oncology | Admitting: Radiation Oncology

## 2015-08-07 ENCOUNTER — Ambulatory Visit: Payer: Commercial Managed Care - HMO | Admitting: Radiation Oncology

## 2015-08-07 ENCOUNTER — Encounter: Payer: Self-pay | Admitting: Radiation Oncology

## 2015-08-07 VITALS — BP 107/76 | HR 80 | Temp 98.0°F

## 2015-08-07 DIAGNOSIS — C50911 Malignant neoplasm of unspecified site of right female breast: Secondary | ICD-10-CM

## 2015-08-07 DIAGNOSIS — Z510 Encounter for antineoplastic radiation therapy: Secondary | ICD-10-CM | POA: Diagnosis not present

## 2015-08-07 MED ORDER — SILVER SULFADIAZINE 1 % EX CREA
1.0000 "application " | TOPICAL_CREAM | Freq: Once | CUTANEOUS | Status: AC
  Administered 2015-08-07: 1 via TOPICAL

## 2015-08-07 NOTE — Progress Notes (Signed)
   Weekly Management Note:  Outpatient     ICD-9-CM ICD-10-CM   1. Breast cancer of upper-outer quadrant of right female breast ([place]) 174.4 C50.411           Current Dose:  50.4 Gy  Projected Dose: 60.4 Gy   Narrative:  The patient presents for routine under treatment assessment.  CBCT/MVCT images/Port film x-rays were reviewed.  The chart was checked.  more peeling of skin   Physical Findings:  vitals were not taken for this visit.  Wt Readings from Last 3 Encounters:  08/04/15 212 lb 4.8 oz (96.299 kg)  07/24/15 204 lb 11.2 oz (92.851 kg)  07/17/15 211 lb (95.709 kg)   She has increased area of moist desquamation in the right axilla. No sign of active infection. Skin over her right chest wall shows dry desquamation and hyperpigmentation.  Impression: The patient is tolerating radiotherapy.  Plan:  Continue radiotherapy as planned. We will start silvadene in axilla.   ______________   Eppie Gibson, M.D.

## 2015-08-07 NOTE — Progress Notes (Addendum)
Ms. Barbian came after treatment today to be seen by Dr. Isidore Moos. Her area of moist desquamation in her axillary area has increased. There is scant amount of drainage noted, and a foul odor present. Dr. Isidore Moos saw her and requested I give her silvadene cream. She was given the silvadene cream and instructed on the use and encouraged to call with any questions.   BP 107/76 mmHg  Pulse 80  Temp(Src) 98 F (36.7 C)

## 2015-08-08 ENCOUNTER — Ambulatory Visit
Admission: RE | Admit: 2015-08-08 | Discharge: 2015-08-08 | Disposition: A | Discharge status: Home / Self Care | Payer: Commercial Managed Care - HMO | Source: Ambulatory Visit | Attending: Radiation Oncology | Admitting: Radiation Oncology

## 2015-08-08 ENCOUNTER — Ambulatory Visit: Payer: Commercial Managed Care - HMO

## 2015-08-08 DIAGNOSIS — Z9189 Other specified personal risk factors, not elsewhere classified: Secondary | ICD-10-CM

## 2015-08-08 DIAGNOSIS — M25611 Stiffness of right shoulder, not elsewhere classified: Secondary | ICD-10-CM

## 2015-08-08 DIAGNOSIS — Z510 Encounter for antineoplastic radiation therapy: Secondary | ICD-10-CM | POA: Diagnosis not present

## 2015-08-08 DIAGNOSIS — C50411 Malignant neoplasm of upper-outer quadrant of right female breast: Secondary | ICD-10-CM

## 2015-08-08 DIAGNOSIS — M25612 Stiffness of left shoulder, not elsewhere classified: Secondary | ICD-10-CM

## 2015-08-08 NOTE — Therapy (Signed)
[place], [place], [address] Phone: [phone]   Fax:  [phone]  Physical Therapy Treatment  Patient Details  Name: Jamie Edwards MRN: [id number] Date of Birth: Feb 11, 1971 No Data Recorded  Encounter Date: 08/08/2015      PT End of Session - 08/08/15 0852    Visit Number 26   Number of Visits 33   Date for PT Re-Evaluation 09/12/15   PT Start Time 0802   PT Stop Time 0849   PT Time Calculation (min) 47 min   Activity Tolerance Patient limited by pain   Behavior During Therapy [facility] for tasks assessed/performed      Past Medical History  Diagnosis Date  . Increased heart rate 11/2014    pt. put on metoprolol  . Pulmonary embolism ([place]) 11/2014  . DVT (deep venous thrombosis) ([place]) 11/2014    BUE  . Cancer of right breast ([place]) 08/2014    S/P chemo 09/2014-02/02/2015  . Heart murmur     pt. states a doctor stated she had a slight murmur, no studies done. Present PCP has never mention it to her.    Past Surgical History  Procedure Laterality Date  . Mouth surgery      implants  . Portacath placement Right 08/31/2014    Procedure: INSERTION PORT-A-CATH WITH ULTRA SOUND GUIDENCE;  Surgeon: Erroll Luna, MD;  Location: [place];  Service: General;  Laterality: Right;  . Mastectomy complete / simple w/ sentinel node biopsy Right 04/12/2015  . Mastectomy complete / simple Left 04/12/2015    PROPHYLATIC  . Breast reconstruction with placement of tissue expander and flex hd (acellular hydrated dermis) Bilateral 04/12/2015  . Port-a-cath removal  04/12/2015  . Breast biopsy Right 07/2014  . Simple mastectomy with axillary sentinel node biopsy Bilateral 04/12/2015    Procedure: BILATERAL TOTAL MASTECTOMY WITH RIGHT AXILLARY SENTINEL LYMPH  NODE BIOPSY, POSSIBLE AXILLARY DISSECTION, LEFT PROPHYLATIC;  Surgeon: Excell Seltzer, MD;  Location: [place];  Service: General;  Laterality:  Bilateral;  . Port-a-cath removal Right 04/12/2015    Procedure: REMOVAL PORT-A-CATH;  Surgeon: Excell Seltzer, MD;  Location: [place];  Service: General;  Laterality: Right;  . Breast reconstruction with placement of tissue expander and flex hd (acellular hydrated dermis) Bilateral 04/12/2015    Procedure: IMMEDIATE BILATERAL BREAST RECONSTRUCTION WITH PLACEMENT OF TISSUE EXPANDER AND FLEX HD (ACELLULAR HYDRATED DERMIS);  Surgeon: Theodoro Kos, DO;  Location: [place];  Service: Plastics;  Laterality: Bilateral;    There were no vitals filed for this visit.  Visit Diagnosis:  At risk for lymphedema  Stiffness of shoulder joint, right  Stiffness of shoulder joint, left  Cancer of upper-outer quadrant of female breast, right      Subjective Assessment - 08/08/15 0806    Subjective Just wanted to take a break from therapy for a few weeks, that's why I haven't been here. Some of my skin started coming off this weekend at my Rt axilla and flank area. Not having any pain surprisingly, just tenderness at my skin fold areas.    Currently in Pain? No/denies            [facility] PT Assessment - 08/08/15 0001    AROM   Right Shoulder Flexion 106 Degrees  In sitting   Right Shoulder ABduction 87 Degrees                     [facility] Adult PT Treatment/Exercise - 08/08/15  0001    Manual Therapy   Passive ROM In supine for right shoulder IR, ER, abduction, and flexion with ER   Other Manual Therapy In left sidelying, gentle soft tissue work to right medial scapula border for muscle relaxation and pain relief                   Short Term Clinic Goals - 08/08/15 0857    CC Short Term Goal  #2   Title Patient will improve right  shoulder abduction to 120 degrees so that she can achieve position needed for radiation therapy.  86 degrees today 08/08/15   Status On-going             Long Term Clinic Goals - 07/18/15 0858    CC Long Term Goal  #1   Title Patient will  report a decrease in pain by 50% so they can perform daily activities with greater ease  80% improvement reported    Status Achieved   CC Long Term Goal  #2   Title Patient will be independent in a home exercise program   Status On-going   CC Long Term Goal  #3   Title Patient will be know how to obtain and use compression garments for maintenance phase of treatment   Status On-going   CC Long Term Goal  #4   Title Patient will decrease the DASH score to < 20   to demonstrate increased functional use of upper extremity  Improved to 47.73 on 07/13/15   Status On-going   CC Long Term Goal  #5   Title Patient will have a circumferential reduction of   1      cm at    10      cm above the             olecranon process   Status Achieved            Plan - 08/08/15 0853    Clinical Impression Statement Did not do contract/relax today as pts skin is very fragile now and has already broken down some and did not want to do increased stretching today. She has trouble completely relaxing though was able to get to pts end ROM with PROM today. Her AROM has decreased slightly in flexion and abduction due to pt taking a few weeks off. She has one week of boost treatment with radiation left and then she is finished on 11/7.   Pt will benefit from skilled therapeutic intervention in order to improve on the following deficits Decreased range of motion;Pain;Impaired UE functional use   Rehab Potential Good   Clinical Impairments Affecting Rehab Potential Edwards earlier this year with UE swelling    PT Frequency 1x / week   PT Duration 8 weeks   PT Treatment/Interventions Therapeutic exercise;Manual techniques;Passive range of motion   PT Next Visit Plan continue to work on range of motion, stretching and progress toward LTG's; continue soft tissue work to right upper back   Consulted and Agree with Plan of Care Patient        Problem List Patient Active Problem List   Diagnosis Date Noted  . Breast  cancer, right breast ([place]) 04/12/2015  . Arm DVT (deep venous thromboembolism), acute ([place]) 12/03/2014  . Antineoplastic chemotherapy induced pancytopenia ([place]) 11/30/2014  . Pulmonary embolism ([place]) 11/29/2014  . Bronchitis 11/25/2014  . Tachycardia 11/25/2014  . Genetic testing 10/11/2014  . Monoallelic mutation of PALB2 gene 10/11/2014  . Constipation  10/05/2014  . Breast cancer of upper-outer quadrant of right female breast ([place]) 08/10/2014    Otelia Limes, PTA 08/08/2015, 8:58 AM  [place], [place], [address] Phone: [phone]   Fax:  [phone]  Name: Jamie Edwards MRN: [id number] Date of Birth: [birth year]

## 2015-08-09 ENCOUNTER — Ambulatory Visit
Admission: RE | Admit: 2015-08-09 | Discharge: 2015-08-09 | Disposition: A | Discharge status: Home / Self Care | Payer: Commercial Managed Care - HMO | Source: Ambulatory Visit | Attending: Radiation Oncology | Admitting: Radiation Oncology

## 2015-08-09 DIAGNOSIS — Z510 Encounter for antineoplastic radiation therapy: Secondary | ICD-10-CM | POA: Diagnosis not present

## 2015-08-10 ENCOUNTER — Ambulatory Visit
Admission: RE | Admit: 2015-08-10 | Discharge: 2015-08-10 | Disposition: A | Discharge status: Home / Self Care | Payer: Commercial Managed Care - HMO | Source: Ambulatory Visit | Attending: Radiation Oncology | Admitting: Radiation Oncology

## 2015-08-10 DIAGNOSIS — Z510 Encounter for antineoplastic radiation therapy: Secondary | ICD-10-CM | POA: Diagnosis not present

## 2015-08-11 ENCOUNTER — Ambulatory Visit
Admission: RE | Admit: 2015-08-11 | Discharge: 2015-08-11 | Disposition: A | Discharge status: Home / Self Care | Payer: Commercial Managed Care - HMO | Source: Ambulatory Visit | Attending: Radiation Oncology | Admitting: Radiation Oncology

## 2015-08-11 DIAGNOSIS — Z510 Encounter for antineoplastic radiation therapy: Secondary | ICD-10-CM | POA: Diagnosis not present

## 2015-08-14 ENCOUNTER — Ambulatory Visit
Admission: RE | Admit: 2015-08-14 | Discharge: 2015-08-14 | Disposition: A | Discharge status: Home / Self Care | Payer: Commercial Managed Care - HMO | Source: Ambulatory Visit | Attending: Radiation Oncology | Admitting: Radiation Oncology

## 2015-08-14 ENCOUNTER — Encounter: Payer: Self-pay | Admitting: Radiation Oncology

## 2015-08-14 VITALS — BP 115/78 | HR 89 | Temp 97.8°F | Ht 68.0 in | Wt 211.4 lb

## 2015-08-14 DIAGNOSIS — Z510 Encounter for antineoplastic radiation therapy: Secondary | ICD-10-CM | POA: Diagnosis not present

## 2015-08-14 DIAGNOSIS — C50411 Malignant neoplasm of upper-outer quadrant of right female breast: Secondary | ICD-10-CM

## 2015-08-14 NOTE — Progress Notes (Signed)
Ms. Medero is here for her last treatment of radiation to her Right Breast. She denies any fatigue at this time, and still is able to complete her normal daily activities. Her Right axilla is much improved from last time, when given silvadene. The axilla is hyperpigmented, but no peeling is noted, and the redness is much improved and almost completely gone. Her anterior breast is hyperpigmented, with no redness noted. She is using radiaplex to this area. Under her breast is less tender, with some peeling noted, and she is using neosporin to this area. She does have an area at her left neck which is red, tender, with moist desquamation noted. She reports she is using neosporin in this area.  BP 115/78 mmHg  Pulse 89  Temp(Src) 97.8 F (36.6 C)  Ht 5\' 8"  (1.727 m)  Wt 211 lb 6.4 oz (95.89 kg)  BMI 32.15 kg/m2

## 2015-08-14 NOTE — Progress Notes (Signed)
   Weekly Management Note  Outpatient    ICD-9-CM ICD-10-CM   1. Breast cancer of upper-outer quadrant of right female breast ([place]) 174.4 C50.411     Completed Radiotherapy. Total Dose: 60.4 Gy   Narrative:  The patient presents for routine under treatment assessment on last day of radiotherapy.  CBCT/MVCT images/Port film x-rays were reviewed.  The chart was checked. Doing well, skin improved with Silvadene and dry now  Physical Findings:  height is 5\' 8"  (1.727 m) and weight is 211 lb 6.4 oz (95.89 kg). Her temperature is 97.8 F (36.6 C). Her blood pressure is 115/78 and her pulse is 89.   No residual moist peeling.  Dry desquamation in R axilla/chest wall.  hyperpigmentation  Impression:  The patient has tolerated radiotherapy.  Plan:  Routine follow-up in one month.  Skin care discussed  ________________________________   Eppie Gibson, M.D.

## 2015-08-15 ENCOUNTER — Ambulatory Visit: Payer: Commercial Managed Care - HMO

## 2015-08-15 ENCOUNTER — Other Ambulatory Visit: Payer: Self-pay | Admitting: Adult Health

## 2015-08-15 DIAGNOSIS — C50411 Malignant neoplasm of upper-outer quadrant of right female breast: Secondary | ICD-10-CM

## 2015-08-15 DIAGNOSIS — Z510 Encounter for antineoplastic radiation therapy: Secondary | ICD-10-CM | POA: Diagnosis not present

## 2015-08-15 DIAGNOSIS — Z9189 Other specified personal risk factors, not elsewhere classified: Secondary | ICD-10-CM

## 2015-08-15 DIAGNOSIS — M25611 Stiffness of right shoulder, not elsewhere classified: Secondary | ICD-10-CM

## 2015-08-15 NOTE — Therapy (Signed)
[place], [place], [address] Phone: [phone]   Fax:  [phone]  Physical Therapy Treatment  Patient Details  Name: Jamie Edwards MRN: [id number] Date of Birth: 1971-03-11 No Data Recorded  Encounter Date: 08/15/2015      PT End of Session - 08/15/15 0817    Visit Number 27   Number of Visits 33   Date for PT Re-Evaluation 09/12/15   PT Start Time 0804   PT Stop Time 0852   PT Time Calculation (min) 48 min   Activity Tolerance Patient tolerated treatment well   Behavior During Therapy [facility] for tasks assessed/performed      Past Medical History  Diagnosis Date  . Increased heart rate 11/2014    pt. put on metoprolol  . Pulmonary embolism ([place]) 11/2014  . DVT (deep venous thrombosis) ([place]) 11/2014    BUE  . Cancer of right breast ([place]) 08/2014    S/P chemo 09/2014-02/02/2015  . Heart murmur     pt. states a doctor stated she had a slight murmur, no studies done. Present PCP has never mention it to her.    Past Surgical History  Procedure Laterality Date  . Mouth surgery      implants  . Portacath placement Right 08/31/2014    Procedure: INSERTION PORT-A-CATH WITH ULTRA SOUND GUIDENCE;  Surgeon: Erroll Luna, MD;  Location: [place];  Service: General;  Laterality: Right;  . Mastectomy complete / simple w/ sentinel node biopsy Right 04/12/2015  . Mastectomy complete / simple Left 04/12/2015    PROPHYLATIC  . Breast reconstruction with placement of tissue expander and flex hd (acellular hydrated dermis) Bilateral 04/12/2015  . Port-a-cath removal  04/12/2015  . Breast biopsy Right 07/2014  . Simple mastectomy with axillary sentinel node biopsy Bilateral 04/12/2015    Procedure: BILATERAL TOTAL MASTECTOMY WITH RIGHT AXILLARY SENTINEL LYMPH  NODE BIOPSY, POSSIBLE AXILLARY DISSECTION, LEFT PROPHYLATIC;  Surgeon: Excell Seltzer, MD;  Location: [place];  Service: General;   Laterality: Bilateral;  . Port-a-cath removal Right 04/12/2015    Procedure: REMOVAL PORT-A-CATH;  Surgeon: Excell Seltzer, MD;  Location: [place];  Service: General;  Laterality: Right;  . Breast reconstruction with placement of tissue expander and flex hd (acellular hydrated dermis) Bilateral 04/12/2015    Procedure: IMMEDIATE BILATERAL BREAST RECONSTRUCTION WITH PLACEMENT OF TISSUE EXPANDER AND FLEX HD (ACELLULAR HYDRATED DERMIS);  Surgeon: Theodoro Kos, DO;  Location: [place];  Service: Plastics;  Laterality: Bilateral;    There were no vitals filed for this visit.  Visit Diagnosis:  At risk for lymphedema  Stiffness of shoulder joint, right  Cancer of upper-outer quadrant of female breast, right      Subjective Assessment - 08/15/15 0806    Subjective I had my last radiation treatment yesterday and the last week was boost treatments so the outer area near my axilla has started to heal some. I've also been using the burn cream the doctor gave me. I just want it better! Not having any pain right now, just tender to touch or pressure at my Rt scapula near the bottom.    Currently in Pain? No/denies            [facility] PT Assessment - 08/15/15 0001    AROM   Right Shoulder Flexion 112 Degrees  In sitting after pulleys   Right Shoulder ABduction 88 Degrees  In sitting after pulleys  [facility] Adult PT Treatment/Exercise - 08/15/15 0001    Shoulder Exercises: Pulleys   Flexion 2 minutes   Flexion Limitations Cuing to relax shoulders and not Rt cervical side bend   ABduction 2 minutes   ABduction Limitations Cuing to relax shoulders   Manual Therapy   Passive ROM In supine for right shoulder IR, ER, abduction, and flexion to pts tolerance.   Other Manual Therapy In left sidelying, gentle soft tissue work to right medial and inferior scapula border for muscle relaxation and pain relief                   Short Term Clinic Goals - 08/15/15 0911     CC Short Term Goal  #2   Title Patient will improve right  shoulder abduction to 120 degrees so that she can achieve position needed for radiation therapy.  88 degrees attained 08/15/15. Pt has now completed radiation and will hopefully begin to resume progress.             [facility] Goals - 08/15/15 0910    CC Long Term Goal  #2   Title Patient will be independent in a home exercise program   Status On-going   CC Long Term Goal  #3   Title Patient will be know how to obtain and use compression garments for maintenance phase of treatment   Status On-going   CC Long Term Goal  #4   Title Patient will decrease the DASH score to < 20   to demonstrate increased functional use of upper extremity   Status On-going            Plan - 08/15/15 0857    Clinical Impression Statement Pt finished radiation treatment yesterday and skin is still fragile and healing so performed very gentle PROM and no myofascial release or contract/relax today. Her end PROM is just tight now with much less pain though she did have pain at end ROM with pulleys. Advised pt ok if she wants to take a 1-2 week rest from therapy to allow her skin time to heal but she has appts just 1x/wk for now and is happy with this not overdoing it. Her AROM measurements were minimally improved in flexion from last week and unchanged with abduction.    Pt will benefit from skilled therapeutic intervention in order to improve on the following deficits Decreased range of motion;Pain;Impaired UE functional use   Rehab Potential Good   Clinical Impairments Affecting Rehab Potential PE earlier this year with UE swelling, finished with radiation 08/14/15 and has healing fragile skin/openings from this   PT Frequency 1x / week   PT Duration 8 weeks   PT Treatment/Interventions Therapeutic exercise;Manual techniques;Passive range of motion   PT Next Visit Plan Remasure circumference of Rt UE; continue to work on range of motion,  stretching and progress toward LTG's; continue soft tissue work to right upper back avoiding Rt chest/axilla for now due to healing skin   Consulted and Agree with Plan of Care Patient        Problem List Patient Active Problem List   Diagnosis Date Noted  . Breast cancer, right breast ([place]) 04/12/2015  . Arm DVT (deep venous thromboembolism), acute ([place]) 12/03/2014  . Antineoplastic chemotherapy induced pancytopenia ([place]) 11/30/2014  . Pulmonary embolism ([place]) 11/29/2014  . Bronchitis 11/25/2014  . Tachycardia 11/25/2014  . Genetic testing 10/11/2014  . Monoallelic mutation of PALB2 gene 10/11/2014  . Constipation 10/05/2014  . Breast  cancer of upper-outer quadrant of right female breast ([place]) 08/10/2014    Otelia Limes, PTA 08/15/2015, 9:12 AM  [place], [place], [address] Phone: [phone]   Fax:  [phone]  Name: ABBEGALE STEHLE MRN: [id number] Date of Birth: 26-Dec-1970

## 2015-08-18 ENCOUNTER — Encounter: Payer: Self-pay | Admitting: Hematology and Oncology

## 2015-08-18 ENCOUNTER — Ambulatory Visit (HOSPITAL_BASED_OUTPATIENT_CLINIC_OR_DEPARTMENT_OTHER): Payer: Commercial Managed Care - HMO

## 2015-08-18 ENCOUNTER — Telehealth: Payer: Self-pay | Admitting: Hematology and Oncology

## 2015-08-18 ENCOUNTER — Telehealth: Payer: Self-pay | Admitting: Pharmacist

## 2015-08-18 ENCOUNTER — Ambulatory Visit (HOSPITAL_BASED_OUTPATIENT_CLINIC_OR_DEPARTMENT_OTHER): Payer: Commercial Managed Care - HMO | Admitting: Hematology and Oncology

## 2015-08-18 VITALS — BP 104/55 | HR 74 | Temp 98.0°F | Resp 18 | Ht 68.0 in | Wt 212.2 lb

## 2015-08-18 DIAGNOSIS — C773 Secondary and unspecified malignant neoplasm of axilla and upper limb lymph nodes: Secondary | ICD-10-CM | POA: Diagnosis not present

## 2015-08-18 DIAGNOSIS — C50411 Malignant neoplasm of upper-outer quadrant of right female breast: Secondary | ICD-10-CM

## 2015-08-18 DIAGNOSIS — Z5111 Encounter for antineoplastic chemotherapy: Secondary | ICD-10-CM | POA: Insufficient documentation

## 2015-08-18 DIAGNOSIS — Z171 Estrogen receptor negative status [ER-]: Secondary | ICD-10-CM

## 2015-08-18 LAB — COMPREHENSIVE METABOLIC PANEL (CC13)
ALT: 32 U/L (ref 0–55)
ANION GAP: 6 meq/L (ref 3–11)
AST: 34 U/L (ref 5–34)
Albumin: 3.3 g/dL — ABNORMAL LOW (ref 3.5–5.0)
Alkaline Phosphatase: 118 U/L (ref 40–150)
BUN: 12.7 mg/dL (ref 7.0–26.0)
CHLORIDE: 102 meq/L (ref 98–109)
CO2: 32 meq/L — AB (ref 22–29)
CREATININE: 0.8 mg/dL (ref 0.6–1.1)
Calcium: 10 mg/dL (ref 8.4–10.4)
GLUCOSE: 102 mg/dL (ref 70–140)
Potassium: 4.2 mEq/L (ref 3.5–5.1)
SODIUM: 140 meq/L (ref 136–145)
Total Bilirubin: 0.36 mg/dL (ref 0.20–1.20)
Total Protein: 6.8 g/dL (ref 6.4–8.3)

## 2015-08-18 LAB — CBC WITH DIFFERENTIAL/PLATELET
BASO%: 0.9 % (ref 0.0–2.0)
Basophils Absolute: 0 10*3/uL (ref 0.0–0.1)
EOS%: 2.9 % (ref 0.0–7.0)
Eosinophils Absolute: 0.1 10*3/uL (ref 0.0–0.5)
HCT: 36.8 % (ref 34.8–46.6)
HGB: 11.7 g/dL (ref 11.6–15.9)
LYMPH%: 27.1 % (ref 14.0–49.7)
MCH: 26.8 pg (ref 25.1–34.0)
MCHC: 31.8 g/dL (ref 31.5–36.0)
MCV: 84.4 fL (ref 79.5–101.0)
MONO#: 0.3 10*3/uL (ref 0.1–0.9)
MONO%: 9.1 % (ref 0.0–14.0)
NEUT#: 2 10*3/uL (ref 1.5–6.5)
NEUT%: 60 % (ref 38.4–76.8)
PLATELETS: 201 10*3/uL (ref 145–400)
RBC: 4.36 10*6/uL (ref 3.70–5.45)
RDW: 17.1 % — ABNORMAL HIGH (ref 11.2–14.5)
WBC: 3.4 10*3/uL — AB (ref 3.9–10.3)
lymph#: 0.9 10*3/uL (ref 0.9–3.3)

## 2015-08-18 MED ORDER — CAPECITABINE 500 MG PO TABS: 1000.0000 mg/m2 | ORAL_TABLET | Freq: Two times a day (BID) | ORAL | Status: DC

## 2015-08-18 NOTE — Assessment & Plan Note (Signed)
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 Isidore Moos  Treatment plan: Adjuvant Xeloda 1000 mg/m 2 weeks on 1 week off for 6 months. This is based upon the following clinical trial  CREATE-X study enrolled 910 patient's with triple negative breast cancer and residual disease after neo-adjuvant therapy. 455 patients assigned to 1250 mg/m times daily 2 weeks on 1 week off  up to 8 cycles, at 5 years PFS 74.1% with Xeloda compared to 67.7% in control arm, 30% reduction in risk, overall survival 89.2% versus 83.9%, hand-foot syndrome was seen in 72.3% with Xeloda grade 3 in 10.9%  Return to clinic 1 week after starting Xeloda to evaluate toxicities

## 2015-08-18 NOTE — Telephone Encounter (Signed)
11/11 - Rx for Xeloda sent to [facility]

## 2015-08-18 NOTE — Progress Notes (Signed)
Patient Care Team: Kelton Pillar, MD as PCP - General (Family Medicine) Nicholas Lose, MD as Consulting Physician (Hematology and Oncology) Excell Seltzer, MD as Consulting Physician (General Surgery) Eppie Gibson, MD as Attending Physician (Radiation Oncology) Holley Bouche, NP as Nurse Practitioner (Nurse Practitioner)  DIAGNOSIS: Breast cancer of upper-outer quadrant of right female breast [facility])   Staging form: Breast, AJCC 7th Edition     Clinical: Stage IIIA (T3, N1, M0) - Unsigned       Staging comments: Staged at breast conference 11.11.15    SUMMARY OF ONCOLOGIC HISTORY:   Breast cancer of upper-outer quadrant of right female breast ([place])   08/05/2014 Initial Diagnosis Invasive ductal carcinoma, 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, grade 3 ER 0%, PR 0%, HER-2 negative, Ki-67 90%: Right axillary lymph node positive for breast cancer   08/12/2014 Breast MRI 4.5x 7.5 x4 cm biopsy-proven right breast upper outer quadrant cancer with biopsy-proven level I right axillary lymph node with 2 level II right axillary lymph nodes identified   09/08/2014 - 02/02/2015 Neo-Adjuvant Chemotherapy Dose dense Adriamycin and Cytoxan x4 followed by weekly Taxol and carboplatin x4; Abraxane 8   02/01/2015 Breast MRI Marked improvement in the breast tumor and resolution of the right axillary lymph node, there are 3 new spots in the breast one beneath the area of unclear significance   04/13/2015 Surgery Bilateral Mastectomy; Right: IDC 5.7 cm grade 3, 3/8 LN pos (1 with micro met and 1 with ECE) Left Mastectomy: Benign; T3N1 (stage IIIa)   06/29/2015 - 08/14/2015 Radiation Therapy Adj XRT with Dr. Isidore Moos    CHIEF COMPLIANT: follow-up after radiation therapy  INTERVAL HISTORY: Jamie Edwards is a 44 year old with above-mentioned history of right breast cancer who received neoadjuvant chemotherapy followed by bilateral mastectomies which showed persistent disease in  the right breast with 3 out of 8 lymph nodes having positive disease with extracapsular extension. She just completed adjuvant radiation therapy with Dr. Isidore Moos. She is here today to discuss adjuvant treatment plan. She is back to working full-time.  REVIEW OF SYSTEMS:   Constitutional: Denies fevers, chills or abnormal weight loss Eyes: Denies blurriness of vision Ears, nose, mouth, throat, and face: Denies mucositis or sore throat Respiratory: Denies cough, dyspnea or wheezes Cardiovascular: Denies palpitation, chest discomfort or lower extremity swelling Gastrointestinal:  Denies nausea, heartburn or change in bowel habits Skin: radiation dermatitis Lymphatics: Denies new lymphadenopathy or easy bruising Neurological:Denies numbness, tingling or new weaknesses Behavioral/Psych: Mood is stable, no new changes  All other systems were reviewed with the patient and are negative.  I have reviewed the past medical history, past surgical history, social history and family history with the patient and they are unchanged from previous note.  ALLERGIES:  is allergic to erythromycin base; penicillins; and zithromax.  MEDICATIONS:  Current Outpatient Prescriptions  Medication Sig Dispense Refill  . acetaminophen (TYLENOL) 325 MG tablet Take 2 tablets (650 mg total) by mouth every 6 (six) hours as needed for mild pain (or Temp > 100).    . ALPRAZolam (XANAX) 0.25 MG tablet Take 0.25 mg by mouth 2 (two) times daily as needed for anxiety.    . capecitabine (XELODA) 500 MG tablet Take 4 tablets (2,000 mg total) by mouth 2 (two) times daily after a meal. 112 tablet 3  . diazepam (VALIUM) 2 MG tablet Take 1 tablet (2 mg total) by mouth every 6 (six) hours as needed for muscle spasms. 30 tablet 0  . hyaluronate  sodium (RADIAPLEXRX) GEL Apply 1 application topically 2 (two) times daily.    Marland Kitchen HYDROcodone-acetaminophen (NORCO/VICODIN) 5-325 MG per tablet Take 1 tablet by mouth every 6 (six) hours as needed  for moderate pain.    . non-metallic deodorant Jethro Poling) MISC Apply 1 application topically daily as needed.    Marland Kitchen oxyCODONE-acetaminophen (ROXICET) 5-325 MG per tablet Take 1-2 tablets by mouth every 4 (four) hours as needed. (Patient not taking: Reported on 07/10/2015) 30 tablet 0  . polyethylene glycol (MIRALAX / GLYCOLAX) packet Take 17 g by mouth daily as needed for mild constipation or moderate constipation. 14 each 0  . senna (SENOKOT) 8.6 MG TABS tablet Take 1 tablet (8.6 mg total) by mouth 2 (two) times daily. (Patient not taking: Reported on 07/10/2015) 120 each 0  . silver sulfADIAZINE (SILVADENE) 1 % cream Apply 1 application topically 2 (two) times daily.     No current facility-administered medications for this visit.    PHYSICAL EXAMINATION: ECOG PERFORMANCE STATUS: 1 - Symptomatic but completely ambulatory  Filed Vitals:   08/18/15 1059  BP: 104/55  Pulse: 74  Temp: 98 F (36.7 C)  Resp: 18   Filed Weights   08/18/15 1059  Weight: 212 lb 3.2 oz (96.253 kg)    GENERAL:alert, no distress and comfortable SKIN: skin color, texture, turgor are normal, no rashes or significant lesions EYES: normal, Conjunctiva are pink and non-injected, sclera clear OROPHARYNX:no exudate, no erythema and lips, buccal mucosa, and tongue normal  NECK: supple, thyroid normal size, non-tender, without nodularity LYMPH:  no palpable lymphadenopathy in the cervical, axillary or inguinal LUNGS: clear to auscultation and percussion with normal breathing effort HEART: regular rate & rhythm and no murmurs and no lower extremity edema ABDOMEN:abdomen soft, non-tender and normal bowel sounds Musculoskeletal:no cyanosis of digits and no clubbing  NEURO: alert & oriented x 3 with fluent speech, no focal motor/sensory deficits  LABORATORY DATA:  I have reviewed the data as listed   Chemistry      Component Value Date/Time   NA 140 08/18/2015 1045   NA 135 04/12/2015 1635   K 4.2 08/18/2015 1045   K  4.3 04/12/2015 1635   CL 100* 04/12/2015 1635   CO2 32* 08/18/2015 1045   CO2 27 04/12/2015 1635   BUN 12.7 08/18/2015 1045   BUN 8 04/12/2015 1635   CREATININE 0.8 08/18/2015 1045   CREATININE 0.72 04/12/2015 1635      Component Value Date/Time   CALCIUM 10.0 08/18/2015 1045   CALCIUM 9.4 04/12/2015 1635   ALKPHOS 118 08/18/2015 1045   AST 34 08/18/2015 1045   ALT 32 08/18/2015 1045   BILITOT 0.36 08/18/2015 1045       Lab Results  Component Value Date   WBC 3.4* 08/18/2015   HGB 11.7 08/18/2015   HCT 36.8 08/18/2015   MCV 84.4 08/18/2015   PLT 201 08/18/2015   NEUTROABS 2.0 08/18/2015    ASSESSMENT & PLAN:  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 Isidore Moos completed 08/14/2015  Treatment plan: Adjuvant Xeloda 1000 mg/m 2 weeks on 1 week off for 6 months. This is based upon the following clinical trial  CREATE-X study enrolled 910 patient's with triple negative breast cancer and residual disease after neo-adjuvant therapy. 455 patients assigned to 1250 mg/m times daily 2 weeks on 1 week off  up to 8 cycles, at 5 years PFS 74.1% with Xeloda compared to 67.7% in control arm, 30% reduction in risk, overall survival 89.2% versus 83.9%, hand-foot syndrome was seen in 72.3% with Xeloda grade 3 in 10.9%  Return to clinic 1 week after starting Xeloda to evaluate toxicities Plan to start Xeloda December 1   Orders Placed  This Encounter  Procedures  . CBC with Differential    Standing Status: Future     Number of Occurrences:      Standing Expiration Date: 08/17/2016  . Comprehensive metabolic panel (Cmet) - [facility]    Standing Status: Future     Number of Occurrences:      Standing Expiration Date: 08/17/2016   The patient has a good understanding of the overall plan. she agrees with it. she will call with any problems that may develop before the next visit here.   Rulon Eisenmenger, MD 08/18/2015

## 2015-08-18 NOTE — Telephone Encounter (Signed)
Appointments made and avs printed for patient °

## 2015-08-22 ENCOUNTER — Ambulatory Visit: Payer: Commercial Managed Care - HMO

## 2015-08-22 DIAGNOSIS — Z9189 Other specified personal risk factors, not elsewhere classified: Secondary | ICD-10-CM

## 2015-08-22 DIAGNOSIS — M25611 Stiffness of right shoulder, not elsewhere classified: Secondary | ICD-10-CM

## 2015-08-22 DIAGNOSIS — C50411 Malignant neoplasm of upper-outer quadrant of right female breast: Secondary | ICD-10-CM

## 2015-08-22 DIAGNOSIS — Z510 Encounter for antineoplastic radiation therapy: Secondary | ICD-10-CM | POA: Diagnosis not present
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
## 2015-08-23 NOTE — Progress Notes (Signed)
  Radiation Oncology         [phone] ________________________________  Name: Jamie Edwards MRN: [id number]  Date: 08/14/2015  DOB: 10/30/1970  End of Treatment Note  Clinical stage IIIa (T3, N1, M0) Breast cancer of upper-outer quadrant of right female breast, IDC,Triple Negative  [id number]   Indication for treatment:  curative       Radiation treatment dates:   06/30/2015-08/14/2015   Site/dose:    1) Right Chest Wall / 50.4 Gy in 28 fractions 2) Right Supraclavicular fossa/ 50.4 Gy in 28 fractions 3) Right Posterior Axillary boost / 8.876 Gy in 28 fractions 4) Right Chest Wall Scar boost / 10 Gy in 5 fractions  Beams/energy:   ] 1) Opposed tangents / 10 and 6 MV photons 2) Left anterior oblique / 10 MV photons 3) PA / 6MV photons 4) En face electrons / 6 MeV electrons    Narrative: The patient tolerated radiation treatment relatively well.   She did benefit from silvadene for moist desquamation at the end of her course.  Plan: The patient has completed radiation treatment. The patient will return to radiation oncology clinic for routine followup in one month. I advised them to call or return sooner if they have any questions or concerns related to their recovery or treatment.  -----------------------------------  Eppie Gibson, MD

## 2015-08-23 NOTE — Telephone Encounter (Signed)
11/16 - Prior Auth approved for Xeloda. PA [id number]. Will follow up with [facility] for copay analysis and assistance

## 2015-08-29 ENCOUNTER — Ambulatory Visit: Payer: Commercial Managed Care - HMO

## 2015-08-30 NOTE — Progress Notes (Signed)
[employer] representative Note  Diagnosis: Breast Cancer 08-04-15  The patient's CT images from her initial simulation were reviewed to plan her boost treatment to her right chest wall scar.  Measurements were made regarding the size and depth of the surgical bed. The boost will be delivered with 6 MeV electrons; 10 Gy in 5 fractions has been prescribed to the  92% isodose line with 0.5 cm bolus.   A special port plan was reviewed and approved.  2   custom electron cut-outs will be used for her abutting boost fields.    -----------------------------------  Eppie Gibson, MD

## 2015-09-05 ENCOUNTER — Ambulatory Visit: Payer: Commercial Managed Care - HMO

## 2015-09-05 ENCOUNTER — Telehealth: Payer: Self-pay | Admitting: Pharmacist

## 2015-09-05 DIAGNOSIS — Z510 Encounter for antineoplastic radiation therapy: Secondary | ICD-10-CM | POA: Diagnosis not present

## 2015-09-05 DIAGNOSIS — Z9189 Other specified personal risk factors, not elsewhere classified: Secondary | ICD-10-CM

## 2015-09-05 DIAGNOSIS — C50411 Malignant neoplasm of upper-outer quadrant of right female breast: Secondary | ICD-10-CM

## 2015-09-05 DIAGNOSIS — M25612 Stiffness of left shoulder, not elsewhere classified: Secondary | ICD-10-CM

## 2015-09-05 DIAGNOSIS — M25611 Stiffness of right shoulder, not elsewhere classified: Secondary | ICD-10-CM

## 2015-09-05 NOTE — Patient Instructions (Signed)
Deep Effective Breath   Standing, sitting, or laying down, place both hands on the belly. Take a deep breath IN, expanding the belly; then breath OUT, contracting the belly. Repeat __5__ times. Do __2-3__ sessions per day and before your self massage.  http://gt2.exer.us/866   Copyright  VHI. All rights reserved.  Axilla to Axilla - Sweep   On uninvolved side make 5 circles in the armpit, then pump _5__ times from involved armpit across chest to uninvolved armpit (avoiding radiated skin until well healed), making a pathway. Do _1__ time per day.  Copyright  VHI. All rights reserved.  Axilla to Inguinal Nodes - Sweep   On involved side, make 5 circles at groin at panty line, then pump _5__ times from armpit along side of trunk to outer hip (avoiding radiated skin until well healed), making your other pathway. Do __1_ time per day.   Copyright  VHI. All rights reserved.  Arm Posterior: Elbow to Shoulder - Sweep   Pump _5__ times from back of elbow to top of shoulder. Then inner to outer upper arm _5_ times, then outer arm again _5_ times. Then back to the pathways _2-3_ times. Do _1__ time per day.  Copyright  VHI. All rights reserved.  ARM: Volar Wrist to Elbow - Sweep   Pump or stationary circles _5__ times from wrist to elbow making sure to do both sides of the forearm. Then retrace your steps to the outer arm, and the pathways _2-3_ times each. Do _1__ time per day.  Copyright  VHI. All rights reserved.  ARM: Dorsum of Hand to Shoulder - Sweep   Pump or stationary circles _5__ times on back of hand including knuckle spaces and individual fingers if needed working up towards the wrist, then retrace all your steps working back up the forearm, doing both sides; upper outer arm and back to your pathways _2-3_ times each. Then do 5 circles again at uninvolved armpit and involved groin where you started! Good job!! Do __1_ time per day.  Copyright  VHI. All rights  reserved.

## 2015-09-05 NOTE — Therapy (Signed)
[place], [place], [address] Phone: [phone]   Fax:  [phone]  Physical Therapy Treatment  Patient Details  Name: Jamie Edwards MRN: [id number] Date of Birth: Jul 28, 1971 No Data Recorded  Encounter Date: 09/05/2015      PT End of Session - 09/05/15 0849    Visit Number 29   Number of Visits 33   Date for PT Re-Evaluation 09/12/15   PT Start Time 0803   PT Stop Time 0849   PT Time Calculation (min) 46 min   Activity Tolerance Patient tolerated treatment well   Behavior During Therapy [facility] for tasks assessed/performed      Past Medical History  Diagnosis Date  . Increased heart rate 11/2014    pt. put on metoprolol  . Pulmonary embolism ([place]) 11/2014  . DVT (deep venous thrombosis) ([place]) 11/2014    BUE  . Cancer of right breast ([place]) 08/2014    S/P chemo 09/2014-02/02/2015  . Heart murmur     pt. states a doctor stated she had a slight murmur, no studies done. Present PCP has never mention it to her.    Past Surgical History  Procedure Laterality Date  . Mouth surgery      implants  . Portacath placement Right 08/31/2014    Procedure: INSERTION PORT-A-CATH WITH ULTRA SOUND GUIDENCE;  Surgeon: Erroll Luna, MD;  Location: [place];  Service: General;  Laterality: Right;  . Mastectomy complete / simple w/ sentinel node biopsy Right 04/12/2015  . Mastectomy complete / simple Left 04/12/2015    PROPHYLATIC  . Breast reconstruction with placement of tissue expander and flex hd (acellular hydrated dermis) Bilateral 04/12/2015  . Port-a-cath removal  04/12/2015  . Breast biopsy Right 07/2014  . Simple mastectomy with axillary sentinel node biopsy Bilateral 04/12/2015    Procedure: BILATERAL TOTAL MASTECTOMY WITH RIGHT AXILLARY SENTINEL LYMPH  NODE BIOPSY, POSSIBLE AXILLARY DISSECTION, LEFT PROPHYLATIC;  Surgeon: Excell Seltzer, MD;  Location: [place];  Service: General;   Laterality: Bilateral;  . Port-a-cath removal Right 04/12/2015    Procedure: REMOVAL PORT-A-CATH;  Surgeon: Excell Seltzer, MD;  Location: [place];  Service: General;  Laterality: Right;  . Breast reconstruction with placement of tissue expander and flex hd (acellular hydrated dermis) Bilateral 04/12/2015    Procedure: IMMEDIATE BILATERAL BREAST RECONSTRUCTION WITH PLACEMENT OF TISSUE EXPANDER AND FLEX HD (ACELLULAR HYDRATED DERMIS);  Surgeon: Theodoro Kos, DO;  Location: [place];  Service: Plastics;  Laterality: Bilateral;    There were no vitals filed for this visit.  Visit Diagnosis:  At risk for lymphedema  Stiffness of shoulder joint, right  Cancer of upper-outer quadrant of female breast, right  Stiffness of shoulder joint, left      Subjective Assessment - 09/05/15 0805    Subjective The tenderness is slightly improved at my Rt scapular, though still there. I just feel so tight at my Rt chest wall and side of trunk near axilla, but I am getting some feeling back.    Currently in Pain? No/denies   Pain Score 0-No pain               LYMPHEDEMA/ONCOLOGY QUESTIONNAIRE - 09/05/15 0808    Right Upper Extremity Lymphedema   10 cm Proximal to Olecranon Process 37.1 cm   Olecranon Process 31 cm   10 cm Proximal to Ulnar Styloid Process 25 cm   Just Proximal to Ulnar Styloid Process 19.4 cm   Across Hand at Thumb  Web Space 18.7 cm   At [place] of 2nd Digit 6.4 cm                  [facility] Adult PT Treatment/Exercise - 09/05/15 0001    Manual Therapy   Soft tissue mobilization In Lt S/L to medial scapular border   Scapular Mobilization In Lt S/L in all planes    Manual Lymphatic Drainage (MLD) In Supine: Short neck avoiding burned area at Rt lateral neck, superficial and deep abdominals, Lt axilla nodes and Lt upper quadrant and anterior inter-axillary anastomosis avoiding radiated area and Rt inguinal nodes, Rt axillo-inguinal avoiding radiated area anastomsis and Rt UE  from dorsal hand to lateral shoulder, then Lt S/L for posterior inter-axillary avoiding radiated area.                PT Education - 09/05/15 0848    Education provided Yes   Education Details Self Manual lymph drainage   Person(s) Educated Patient   Methods Explanation;Demonstration;Handout   Comprehension Verbalized understanding;Need further instruction           Short Term Clinic Goals - 08/15/15 0911    CC Short Term Goal  #2   Title Patient will improve right  shoulder abduction to 120 degrees so that she can achieve position needed for radiation therapy.  88 degrees attained 08/15/15. Pt has now completed radiation and will hopefully begin to resume progress.             [facility] Goals - 09/05/15 [phone]    CC Long Term Goal  #1   Title Patient will report a decrease in pain by 50% so they can perform daily activities with greater ease   Status Achieved   CC Long Term Goal  #2   Title Patient will be independent in a home exercise program   Status On-going   CC Long Term Goal  #3   Title Patient will be know how to obtain and use compression garments for maintenance phase of treatment   Status On-going   CC Long Term Goal  #4   Title Patient will decrease the DASH score to < 20   to demonstrate increased functional use of upper extremity   Status On-going   CC Long Term Goal  #5   Title Patient will have a circumferential reduction of   1      cm at    10      cm above the             olecranon process   Status Achieved            Plan - 09/05/15 0850    Clinical Impression Statement Overall pts circumference measurements were reduced from last time measured except upper arm slightly increased. Pts tenderness to touch is improving as skin continues to heal and she did well with initial instruction of manual lymph drainage.    Pt will benefit from skilled therapeutic intervention in order to improve on the following deficits Decreased range of  motion;Pain;Impaired UE functional use   Rehab Potential Good   Clinical Impairments Affecting Rehab Potential PE earlier this year with UE swelling, finished with radiation 08/14/15 and has healing fragile skin/openings from this   PT Frequency 1x / week   PT Duration 8 weeks   PT Treatment/Interventions Therapeutic exercise;Manual techniques;Passive range of motion   PT Next Visit Plan Review manual lymph drianage prn and continue PROM and manual techniques to  increase this. Measure ROM next visit.   PT Home Exercise Plan Self manual lymph drainage   Consulted and Agree with Plan of Care Patient        Problem List Patient Active Problem List   Diagnosis Date Noted  . Encounter for chemotherapy management 08/18/2015  . Breast cancer, right breast ([place]) 04/12/2015  . Arm DVT (deep venous thromboembolism), acute ([place]) 12/03/2014  . Antineoplastic chemotherapy induced pancytopenia ([place]) 11/30/2014  . Pulmonary embolism ([place]) 11/29/2014  . Bronchitis 11/25/2014  . Tachycardia 11/25/2014  . Genetic testing 10/11/2014  . Monoallelic mutation of PALB2 gene 10/11/2014  . Constipation 10/05/2014  . Breast cancer of upper-outer quadrant of right female breast ([place]) 08/10/2014    Otelia Limes, PTA 09/05/2015, 8:56 AM  [place], [place], [address] Phone: [phone]   Fax:  [phone]  Name: YARA TOMKINSON MRN: [id number] Date of Birth: 1971/04/14

## 2015-09-05 NOTE — Telephone Encounter (Signed)
11/29 - Called patient to follow up on New oral medication: Xeloda. Plan for patient to start on 12/1. Rx is approved and filled through [facility] (Copay $0). Left Voicemail for patient to call back to discuss

## 2015-09-07 ENCOUNTER — Other Ambulatory Visit: Payer: Self-pay | Admitting: Hematology and Oncology

## 2015-09-07 MED ORDER — ONDANSETRON HCL 8 MG PO TABS: 8.0000 mg | ORAL_TABLET | Freq: Three times a day (TID) | ORAL | Status: DC | PRN

## 2015-09-12 ENCOUNTER — Ambulatory Visit: Payer: Commercial Managed Care - HMO

## 2015-09-14 ENCOUNTER — Ambulatory Visit (HOSPITAL_BASED_OUTPATIENT_CLINIC_OR_DEPARTMENT_OTHER): Payer: Commercial Managed Care - HMO | Admitting: Hematology and Oncology

## 2015-09-14 ENCOUNTER — Other Ambulatory Visit (HOSPITAL_BASED_OUTPATIENT_CLINIC_OR_DEPARTMENT_OTHER): Payer: Commercial Managed Care - HMO

## 2015-09-14 ENCOUNTER — Encounter: Payer: Self-pay | Admitting: Hematology and Oncology

## 2015-09-14 ENCOUNTER — Telehealth: Payer: Self-pay | Admitting: Hematology and Oncology

## 2015-09-14 VITALS — BP 115/59 | HR 70 | Temp 97.7°F | Resp 18 | Ht 68.0 in | Wt 213.2 lb

## 2015-09-14 DIAGNOSIS — C773 Secondary and unspecified malignant neoplasm of axilla and upper limb lymph nodes: Secondary | ICD-10-CM | POA: Diagnosis not present

## 2015-09-14 DIAGNOSIS — C50411 Malignant neoplasm of upper-outer quadrant of right female breast: Secondary | ICD-10-CM

## 2015-09-14 DIAGNOSIS — Z171 Estrogen receptor negative status [ER-]: Secondary | ICD-10-CM

## 2015-09-14 LAB — CBC WITH DIFFERENTIAL/PLATELET
BASO%: 0.8 % (ref 0.0–2.0)
BASOS ABS: 0 10*3/uL (ref 0.0–0.1)
EOS%: 1.4 % (ref 0.0–7.0)
Eosinophils Absolute: 0.1 10*3/uL (ref 0.0–0.5)
HEMATOCRIT: 36.2 % (ref 34.8–46.6)
HGB: 11.9 g/dL (ref 11.6–15.9)
LYMPH%: 21.9 % (ref 14.0–49.7)
MCH: 27.2 pg (ref 25.1–34.0)
MCHC: 32.8 g/dL (ref 31.5–36.0)
MCV: 82.9 fL (ref 79.5–101.0)
MONO#: 0.4 10*3/uL (ref 0.1–0.9)
MONO%: 8.2 % (ref 0.0–14.0)
NEUT#: 3.4 10*3/uL (ref 1.5–6.5)
NEUT%: 67.7 % (ref 38.4–76.8)
Platelets: 342 10*3/uL (ref 145–400)
RBC: 4.37 10*6/uL (ref 3.70–5.45)
RDW: 16.7 % — AB (ref 11.2–14.5)
WBC: 5.1 10*3/uL (ref 3.9–10.3)
lymph#: 1.1 10*3/uL (ref 0.9–3.3)

## 2015-09-14 LAB — COMPREHENSIVE METABOLIC PANEL
ALT: 11 U/L (ref 0–55)
AST: 22 U/L (ref 5–34)
Albumin: 3.5 g/dL (ref 3.5–5.0)
Alkaline Phosphatase: 136 U/L (ref 40–150)
Anion Gap: 12 mEq/L — ABNORMAL HIGH (ref 3–11)
BUN: 13.3 mg/dL (ref 7.0–26.0)
CALCIUM: 10.7 mg/dL — AB (ref 8.4–10.4)
CHLORIDE: 100 meq/L (ref 98–109)
CO2: 27 mEq/L (ref 22–29)
Creatinine: 0.9 mg/dL (ref 0.6–1.1)
EGFR: 90 mL/min/{1.73_m2} — AB (ref >90)
Glucose: 127 mg/dl (ref 70–140)
POTASSIUM: 4 meq/L (ref 3.5–5.1)
Sodium: 140 mEq/L (ref 136–145)
Total Bilirubin: 0.32 mg/dL (ref 0.20–1.20)
Total Protein: 7.9 g/dL (ref 6.4–8.3)

## 2015-09-14 NOTE — Assessment & Plan Note (Signed)
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 Isidore Moos completed 08/14/2015  CurrentTreatment: Adjuvant Xeloda 1000 mg/m 2 weeks on 1 week off for 6 months started 09/07/2015  Xeloda toxicities:  Return to clinic in 2 weeks to start cycle 2

## 2015-09-14 NOTE — Progress Notes (Signed)
Patient Care Team: Kelton Pillar, MD as PCP - General (Family Medicine) Nicholas Lose, MD as Consulting Physician (Hematology and Oncology) Excell Seltzer, MD as Consulting Physician (General Surgery) Eppie Gibson, MD as Attending Physician (Radiation Oncology) Holley Bouche, NP as Nurse Practitioner (Nurse Practitioner)  DIAGNOSIS: Breast cancer of upper-outer quadrant of right female breast [facility])   Staging form: Breast, AJCC 7th Edition     Clinical: Stage IIIA (T3, N1, M0) - Unsigned       Staging comments: Staged at breast conference 11.11.15    SUMMARY OF ONCOLOGIC HISTORY:   Breast cancer of upper-outer quadrant of right female breast ([place])   08/05/2014 Initial Diagnosis Invasive ductal carcinoma, 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, grade 3 ER 0%, PR 0%, HER-2 negative, Ki-67 90%: Right axillary lymph node positive for breast cancer   08/12/2014 Breast MRI 4.5x 7.5 x4 cm biopsy-proven right breast upper outer quadrant cancer with biopsy-proven level I right axillary lymph node with 2 level II right axillary lymph nodes identified   09/08/2014 - 02/02/2015 Neo-Adjuvant Chemotherapy Dose dense Adriamycin and Cytoxan x4 followed by weekly Taxol and carboplatin x4; Abraxane 8   02/01/2015 Breast MRI Marked improvement in the breast tumor and resolution of the right axillary lymph node, there are 3 new spots in the breast one beneath the area of unclear significance   04/13/2015 Surgery Bilateral Mastectomy; Right: IDC 5.7 cm grade 3, 3/8 LN pos (1 with micro met and 1 with ECE) Left Mastectomy: Benign; T3N1 (stage IIIa)   06/29/2015 - 08/14/2015 Radiation Therapy Adj XRT with Dr. Isidore Moos   09/07/2015 -  Chemotherapy adjuvant Xeloda 2000 mg by mouth twice a day 2 weeks on 1 week off 6 months    CHIEF COMPLIANT: follow-up on Xeloda  INTERVAL HISTORY: Jamie Edwards is a 44 year old lady with above-mentioned history of triple negative breast cancer  who is currently on adjuvant Xeloda. She appears to be tolerating extremely well. She denies any nausea or vomiting. Denies any mouth sores or fevers or chills.  REVIEW OF SYSTEMS:   Constitutional: Denies fevers, chills or abnormal weight loss Eyes: Denies blurriness of vision Ears, nose, mouth, throat, and face: Denies mucositis or sore throat Respiratory: Denies cough, dyspnea or wheezes Cardiovascular: Denies palpitation, chest discomfort or lower extremity swelling Gastrointestinal:  Denies nausea, heartburn or change in bowel habits Skin: Denies abnormal skin rashes Lymphatics: Denies new lymphadenopathy or easy bruising Neurological:Denies numbness, tingling or new weaknesses Behavioral/Psych: Mood is stable, no new changes  Breast:  denies any pain or lumps or nodules in either breasts All other systems were reviewed with the patient and are negative.  I have reviewed the past medical history, past surgical history, social history and family history with the patient and they are unchanged from previous note.  ALLERGIES:  is allergic to erythromycin base; penicillins; and zithromax.  MEDICATIONS:  Current Outpatient Prescriptions  Medication Sig Dispense Refill  . acetaminophen (TYLENOL) 325 MG tablet Take 2 tablets (650 mg total) by mouth every 6 (six) hours as needed for mild pain (or Temp > 100). (Patient not taking: Reported on 08/22/2015)    . ALPRAZolam (XANAX) 0.25 MG tablet Take 0.25 mg by mouth 2 (two) times daily as needed for anxiety.    . capecitabine (XELODA) 500 MG tablet Take 4 tablets (2,000 mg total) by mouth 2 (two) times daily after a meal. Take for 14 days then off for 1 week Every 21 days 112 tablet 3  .  diazepam (VALIUM) 2 MG tablet Take 1 tablet (2 mg total) by mouth every 6 (six) hours as needed for muscle spasms. (Patient not taking: Reported on 08/22/2015) 30 tablet 0  . hyaluronate sodium (RADIAPLEXRX) GEL Apply 1 application topically 2 (two) times daily.     Marland Kitchen HYDROcodone-acetaminophen (NORCO/VICODIN) 5-325 MG per tablet Take 1 tablet by mouth every 6 (six) hours as needed for moderate pain.    . non-metallic deodorant Jethro Poling) MISC Apply 1 application topically daily as needed.    . ondansetron (ZOFRAN) 8 MG tablet Take 1 tablet (8 mg total) by mouth every 8 (eight) hours as needed for nausea or vomiting. 30 tablet 3  . oxyCODONE-acetaminophen (ROXICET) 5-325 MG per tablet Take 1-2 tablets by mouth every 4 (four) hours as needed. (Patient not taking: Reported on 07/10/2015) 30 tablet 0  . polyethylene glycol (MIRALAX / GLYCOLAX) packet Take 17 g by mouth daily as needed for mild constipation or moderate constipation. (Patient not taking: Reported on 08/22/2015) 14 each 0  . senna (SENOKOT) 8.6 MG TABS tablet Take 1 tablet (8.6 mg total) by mouth 2 (two) times daily. (Patient not taking: Reported on 07/10/2015) 120 each 0  . silver sulfADIAZINE (SILVADENE) 1 % cream Apply 1 application topically 2 (two) times daily.     No current facility-administered medications for this visit.    PHYSICAL EXAMINATION: ECOG PERFORMANCE STATUS: 1 - Symptomatic but completely ambulatory  Filed Vitals:   09/14/15 1405  BP: 115/59  Pulse: 70  Temp: 97.7 F (36.5 C)  Resp: 18   Filed Weights   09/14/15 1405  Weight: 213 lb 3.2 oz (96.707 kg)    GENERAL:alert, no distress and comfortable SKIN: skin color, texture, turgor are normal, no rashes or significant lesions EYES: normal, Conjunctiva are pink and non-injected, sclera clear OROPHARYNX:no exudate, no erythema and lips, buccal mucosa, and tongue normal  NECK: supple, thyroid normal size, non-tender, without nodularity LYMPH:  no palpable lymphadenopathy in the cervical, axillary or inguinal LUNGS: clear to auscultation and percussion with normal breathing effort HEART: regular rate & rhythm and no murmurs and no lower extremity edema ABDOMEN:abdomen soft, non-tender and normal bowel  sounds Musculoskeletal:no cyanosis of digits and no clubbing  NEURO: alert & oriented x 3 with fluent speech, no focal motor/sensory deficits  LABORATORY DATA:  I have reviewed the data as listed   Chemistry      Component Value Date/Time   NA 140 09/14/2015 1341   NA 135 04/12/2015 1635   K 4.0 09/14/2015 1341   K 4.3 04/12/2015 1635   CL 100* 04/12/2015 1635   CO2 27 09/14/2015 1341   CO2 27 04/12/2015 1635   BUN 13.3 09/14/2015 1341   BUN 8 04/12/2015 1635   CREATININE 0.9 09/14/2015 1341   CREATININE 0.72 04/12/2015 1635      Component Value Date/Time   CALCIUM 10.7* 09/14/2015 1341   CALCIUM 9.4 04/12/2015 1635   ALKPHOS 136 09/14/2015 1341   AST 22 09/14/2015 1341   ALT 11 09/14/2015 1341   BILITOT 0.32 09/14/2015 1341       Lab Results  Component Value Date   WBC 5.1 09/14/2015   HGB 11.9 09/14/2015   HCT 36.2 09/14/2015   MCV 82.9 09/14/2015   PLT 342 09/14/2015   NEUTROABS 3.4 09/14/2015   ASSESSMENT & PLAN:  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 Isidore Moos completed 08/14/2015  CurrentTreatment: Adjuvant Xeloda 1000 mg/m 2 weeks on 1 week off for 6 months started 09/07/2015  Xeloda toxicities: Denies any diarrhea, nausea, mouth sores, rash on hands or feet. She has excellent appetite and appears to be gaining weight. She will start cycle 2 10/02/2015. She is hoping to go on a cruise on 09/28/2015. I encouraged her  to participate in the [facility] program.  PALB2: I discussed with her about meeting with her gynecologist regarding pelvic exams were ovarian cancer surveillance and screening.  Return to clinic in January to start cycle 3.   Orders Placed This Encounter  Procedures  . CBC with Differential    Standing Status: Future     Number of Occurrences:      Standing Expiration Date: 09/13/2016  . Comprehensive metabolic panel    Standing Status: Future     Number of Occurrences:      Standing Expiration Date: 09/13/2016   The patient has a good understanding of the overall plan. she agrees with it. she will call with any problems that may develop before the next visit here.   Rulon Eisenmenger, MD 09/14/2015

## 2015-09-14 NOTE — Addendum Note (Signed)
Addended by: Prentiss Bells on: 09/14/2015 08:33 PM   Modules accepted: Orders, Medications

## 2015-09-14 NOTE — Telephone Encounter (Signed)
Appointments made and avs printed for patient °

## 2015-09-19 ENCOUNTER — Ambulatory Visit: Payer: Commercial Managed Care - HMO

## 2015-09-20 ENCOUNTER — Telehealth: Payer: Self-pay | Admitting: Pharmacist

## 2015-09-20 NOTE — Telephone Encounter (Signed)
09/20/15 - Called patient to follow up on oral medication: Xeloda. No Answer. Left VM for patient to call back with any questions or concerns.   Thank you,  Montel Clock, PharmD, [facility]

## 2015-09-22 ENCOUNTER — Encounter: Payer: Self-pay | Admitting: Radiation Oncology

## 2015-09-22 ENCOUNTER — Ambulatory Visit
Admission: RE | Admit: 2015-09-22 | Discharge: 2015-09-22 | Disposition: A | Discharge status: Home / Self Care | Payer: Commercial Managed Care - HMO | Source: Ambulatory Visit | Attending: Radiation Oncology | Admitting: Radiation Oncology

## 2015-09-22 VITALS — BP 114/70 | HR 88 | Temp 97.7°F | Ht 68.0 in | Wt 216.3 lb

## 2015-09-22 DIAGNOSIS — C50411 Malignant neoplasm of upper-outer quadrant of right female breast: Secondary | ICD-10-CM

## 2015-09-22 NOTE — Progress Notes (Signed)
Jamie Edwards is here for follow up of radiation completed 08/14/15 to her Right Chest wall. She reports she has a good energy level. She denies any pain at this time. Her anterior Right chest is hyperpigmented with dry skin. She has no moist desquamation and is not using her silvadene cream. She is still using the radiaplex cream to the radiation site. She is wearing a lymphedema sleeve for Right arm swelling. She is attending Physical Therapy as much as she can, but is supposed to attend weekly.  BP 114/70 mmHg  Pulse 88  Temp(Src) 97.7 F (36.5 C)  Ht 5\' 8"  (1.727 m)  Wt 216 lb 4.8 oz (98.113 kg)  BMI 32.90 kg/m2   Wt Readings from Last 3 Encounters:  09/22/15 216 lb 4.8 oz (98.113 kg)  09/14/15 213 lb 3.2 oz (96.707 kg)  08/18/15 212 lb 3.2 oz (96.253 kg)

## 2015-09-22 NOTE — Progress Notes (Signed)
Radiation Oncology         [phone] ________________________________  Name: Jamie Edwards MRN: [id number]  Date: 09/22/2015  DOB: 1971-07-27  Follow-Up Visit Note  Outpatient  CC: Osborne Casco, MD  Nicholas Lose, MD  Diagnosis and Prior Radiotherapy: Clinical stage IIIa (T3, N1, M0) Breast cancer of upper-outer quadrant of right female breast, IDC,Triple Negative  [id number]      ICD-9-CM ICD-10-CM   1. Breast cancer of upper-outer quadrant of right female breast ([place]) 174.4 C50.411    Radiation treatment dates:   06/30/2015-08/14/2015  Site/dose:    1) Right Chest Wall / 50.4 Gy in 28 fractions 2) Right Supraclavicular fossa/ 50.4 Gy in 28 fractions 3) Right Posterior Axillary boost / 8.876 Gy in 28 fractions 4) Right Chest Wall Scar boost / 10 Gy in 5 fractions   Narrative:  The patient returns today for routine follow-up of radiation completed 08/14/15 to her Right Chest wall. She reports she has a good energy level. She denies any pain at this time. Her anterior Right chest is hyperpigmented with dry skin. She has no moist desquamation and is not using her silvadene cream. She is still using the radiaplex cream to the radiation site. She is wearing a lymphedema sleeve for Right arm swelling. Notes she has swelling every once and a while but not often, reporting that "it'll sometimes flare up". She is attending Physical Therapy as much as she can, but is supposed to attend weekly.                               ALLERGIES:  is allergic to erythromycin base; penicillins; and zithromax.  Meds: Current Outpatient Prescriptions  Medication Sig Dispense Refill  . acetaminophen (TYLENOL) 325 MG tablet Take 2 tablets (650 mg total) by mouth every 6 (six) hours as needed for mild pain (or Temp > 100).    . ALPRAZolam (XANAX) 0.25 MG tablet Take 0.25 mg by mouth 2 (two) times daily as needed for anxiety.    . capecitabine (XELODA) 500 MG tablet Take 4 tablets (2,000 mg  total) by mouth 2 (two) times daily after a meal. Take for 14 days then off for 1 week Every 21 days 112 tablet 3  . senna (SENOKOT) 8.6 MG TABS tablet Take 1 tablet (8.6 mg total) by mouth 2 (two) times daily. 120 each 0  . diazepam (VALIUM) 2 MG tablet Take 1 tablet (2 mg total) by mouth every 6 (six) hours as needed for muscle spasms. (Patient not taking: Reported on 09/22/2015) 30 tablet 0  . HYDROcodone-acetaminophen (NORCO/VICODIN) 5-325 MG per tablet Take 1 tablet by mouth every 6 (six) hours as needed for moderate pain. Reported on 09/22/2015    . ondansetron (ZOFRAN) 8 MG tablet Take 1 tablet (8 mg total) by mouth every 8 (eight) hours as needed for nausea or vomiting. (Patient not taking: Reported on 09/22/2015) 30 tablet 3  . oxyCODONE-acetaminophen (ROXICET) 5-325 MG per tablet Take 1-2 tablets by mouth every 4 (four) hours as needed. (Patient not taking: Reported on 09/22/2015) 30 tablet 0  . polyethylene glycol (MIRALAX / GLYCOLAX) packet Take 17 g by mouth daily as needed for mild constipation or moderate constipation. (Patient not taking: Reported on 09/22/2015) 14 each 0  . silver sulfADIAZINE (SILVADENE) 1 % cream Apply 1 application topically 2 (two) times daily. Reported on 09/22/2015     No current facility-administered medications for this encounter.  Physical Findings: The patient is in no acute distress. Patient is alert and oriented.  height is 5\' 8"  (1.727 m) and weight is 216 lb 4.8 oz (98.113 kg). Her temperature is 97.7 F (36.5 C). Her blood pressure is 114/70 and her pulse is 88. .    Wearing a right lymphedema sleeve which appears to be controlling her lymphedema well at this time. Skin over her right chest is still hyperpigmentation and dry with some dry peeling but no moist desquamation. No moist desquamation in the axilla but some dry peeling resolving as well. Some resolving pigment changes over the right lower neck and right upper back.   Lab Findings: Lab  Results  Component Value Date   WBC 5.1 09/14/2015   HGB 11.9 09/14/2015   HCT 36.2 09/14/2015   MCV 82.9 09/14/2015   PLT 342 09/14/2015    Radiographic Findings: No results found.  Impression/Plan:  I have suggested for the patient to begin using Vitamin E oil on her treated areas to help with the resolving dry skin and hyperpigmentation. Pending reconstructive surgery with Dr. Migdalia Dk later in 2017. She is doing well overall, healing from RT.  I encouraged her to continue with yearly mammography and followup with medical oncology. I will see her back on an as-needed basis. I have encouraged her to call if she has any issues or concerns in the future. I wished her the very best.  _____________________________________   Eppie Gibson, MD   This document serves as a record of services personally performed by Eppie Gibson, MD. It was created on her behalf by Arlyce Harman, a trained medical scribe. The creation of this record is based on the scribe's personal observations and the provider's statements to them. This document has been checked and approved by the attending provider.

## 2015-10-05 ENCOUNTER — Ambulatory Visit (HOSPITAL_BASED_OUTPATIENT_CLINIC_OR_DEPARTMENT_OTHER): Payer: Commercial Managed Care - HMO | Admitting: Nurse Practitioner

## 2015-10-05 ENCOUNTER — Ambulatory Visit: Payer: Commercial Managed Care - HMO | Attending: Radiation Oncology | Admitting: Physical Therapy

## 2015-10-05 ENCOUNTER — Encounter: Payer: Self-pay | Admitting: Nurse Practitioner

## 2015-10-05 VITALS — BP 110/75 | HR 100 | Temp 98.8°F | Resp 18 | Wt 215.0 lb

## 2015-10-05 DIAGNOSIS — C50411 Malignant neoplasm of upper-outer quadrant of right female breast: Secondary | ICD-10-CM

## 2015-10-05 DIAGNOSIS — Z1509 Genetic susceptibility to other malignant neoplasm: Secondary | ICD-10-CM

## 2015-10-05 DIAGNOSIS — Z171 Estrogen receptor negative status [ER-]: Secondary | ICD-10-CM | POA: Diagnosis not present

## 2015-10-05 DIAGNOSIS — Z9189 Other specified personal risk factors, not elsewhere classified: Secondary | ICD-10-CM | POA: Insufficient documentation

## 2015-10-05 DIAGNOSIS — M25611 Stiffness of right shoulder, not elsewhere classified: Secondary | ICD-10-CM | POA: Diagnosis present

## 2015-10-05 DIAGNOSIS — C773 Secondary and unspecified malignant neoplasm of axilla and upper limb lymph nodes: Secondary | ICD-10-CM | POA: Diagnosis not present

## 2015-10-05 DIAGNOSIS — M25612 Stiffness of left shoulder, not elsewhere classified: Secondary | ICD-10-CM | POA: Insufficient documentation

## 2015-10-05 DIAGNOSIS — M6281 Muscle weakness (generalized): Secondary | ICD-10-CM | POA: Diagnosis present

## 2015-10-05 DIAGNOSIS — Z1501 Genetic susceptibility to malignant neoplasm of breast: Secondary | ICD-10-CM

## 2015-10-05 DIAGNOSIS — L905 Scar conditions and fibrosis of skin: Secondary | ICD-10-CM | POA: Insufficient documentation

## 2015-10-05 DIAGNOSIS — Z1589 Genetic susceptibility to other disease: Secondary | ICD-10-CM

## 2015-10-05 NOTE — Patient Instructions (Addendum)
Over Head Pull: Narrow Grip       On back, knees bent, feet flat, band across thighs, elbows straight but relaxed. Pull hands apart (start). Keeping elbows straight, bring arms up and over head, hands toward floor. Keep pull steady on band. Hold momentarily. Return slowly, keeping pull steady, back to start. Repeat _5-10__ times. Band color _red_____    Elmer Picker   On back, knees bent, feet flat, left hand on left hip, right hand above left. Pull right arm DIAGONALLY (hip to shoulder) across chest. Bring right arm along head toward floor. Hold momentarily. Slowly return to starting position. Repeat _5-10__ times. Do with left arm. Band color __red___   Shoulder Rotation: Double Arm   On back, knees bent, feet flat, elbows tucked at sides, bent 90, hands palms up. Pull hands apart and down toward floor, keeping elbows near sides. Hold momentarily. Slowly return to starting position. Repeat _5-10__ times. Band color _red_____

## 2015-10-05 NOTE — Therapy (Signed)
[facility], [place], [address] Phone: [phone]   Fax:  [phone]  Physical Therapy Treatment  Patient Details  Name: Jamie Edwards MRN: [id number] Date of Birth: 09-30-1971 No Data Recorded  Encounter Date: 10/05/2015      PT End of Session - 10/05/15 1534    Visit Number 30   Number of Visits 49   Date for PT Re-Evaluation 12/05/15   PT Start Time 1300   PT Stop Time 1345   PT Time Calculation (min) 45 min   Activity Tolerance Patient tolerated treatment well   Behavior During Therapy [facility] for tasks assessed/performed      Past Medical History  Diagnosis Date  . Increased heart rate 11/2014    pt. put on metoprolol  . Pulmonary embolism ([place]) 11/2014  . DVT (deep venous thrombosis) ([place]) 11/2014    BUE  . Cancer of right breast ([place]) 08/2014    S/P chemo 09/2014-02/02/2015  . Heart murmur     pt. states a doctor stated she had a slight murmur, no studies done. Present PCP has never mention it to her.    Past Surgical History  Procedure Laterality Date  . Mouth surgery      implants  . Portacath placement Right 08/31/2014    Procedure: INSERTION PORT-A-CATH WITH ULTRA SOUND GUIDENCE;  Surgeon: Erroll Luna, MD;  Location: [facility];  Service: General;  Laterality: Right;  . Mastectomy complete / simple w/ sentinel node biopsy Right 04/12/2015  . Mastectomy complete / simple Left 04/12/2015    PROPHYLATIC  . Breast reconstruction with placement of tissue expander and flex hd (acellular hydrated dermis) Bilateral 04/12/2015  . Port-a-cath removal  04/12/2015  . Breast biopsy Right 07/2014  . Simple mastectomy with axillary sentinel node biopsy Bilateral 04/12/2015    Procedure: BILATERAL TOTAL MASTECTOMY WITH RIGHT AXILLARY SENTINEL LYMPH  NODE BIOPSY, POSSIBLE AXILLARY DISSECTION, LEFT PROPHYLATIC;  Surgeon: Excell Seltzer, MD;  Location: [facility];  Service: General;   Laterality: Bilateral;  . Port-a-cath removal Right 04/12/2015    Procedure: REMOVAL PORT-A-CATH;  Surgeon: Excell Seltzer, MD;  Location: [facility];  Service: General;  Laterality: Right;  . Breast reconstruction with placement of tissue expander and flex hd (acellular hydrated dermis) Bilateral 04/12/2015    Procedure: IMMEDIATE BILATERAL BREAST RECONSTRUCTION WITH PLACEMENT OF TISSUE EXPANDER AND FLEX HD (ACELLULAR HYDRATED DERMIS);  Surgeon: Theodoro Kos, DO;  Location: [facility];  Service: Plastics;  Laterality: Bilateral;    There were no vitals filed for this visit.  Visit Diagnosis:  At risk for lymphedema - Plan: PT plan of care cert/re-cert  Stiffness of shoulder joint, right - Plan: PT plan of care cert/re-cert  Cancer of upper-outer quadrant of female breast, right - Plan: PT plan of care cert/re-cert  Stiffness of shoulder joint, left - Plan: PT plan of care cert/re-cert  Scar condition and fibrosis of skin - Plan: PT plan of care cert/re-cert  Generalized muscle weakness - Plan: PT plan of care cert/re-cert      Subjective Assessment - 10/05/15 1304    Subjective Pt is to go to the plastic surgeon to hopefully get a schedule for implant insertions.  She has been busy at work and now comes back to finish her physical therapy.  Finished radiation on Nov.7  She comes in weaing the tg soft. She said that  [facility] has not approved her sleeve yet.  [facility] PT Assessment - 10/05/15 0001    AROM   Right Shoulder Flexion 108 Degrees   Right Shoulder ABduction 88 Degrees   Left Shoulder Flexion 110 Degrees   Left Shoulder ABduction 78 Degrees   Strength   Right Shoulder Flexion 3-/5   Right Shoulder ABduction 3-/5   Left Shoulder Flexion 3-/5   Left Shoulder ABduction 3-/5   Palpation   Palpation comment multiple tight muscular areas and trigger points in bilateral anterior and posterior trunk and axilla  decreased scapular mobility             LYMPHEDEMA/ONCOLOGY QUESTIONNAIRE - 10/05/15 1308    Right Upper Extremity Lymphedema   10 cm Proximal to Olecranon Process 38.5 cm   Olecranon Process 30.5 cm  below lobule   10 cm Proximal to Ulnar Styloid Process 26.7 cm   Just Proximal to Ulnar Styloid Process 17.6 cm   Across Hand at [employer] 18.7 cm   At [place] of 2nd Digit 6.5 cm   Left Upper Extremity Lymphedema   10 cm Proximal to Olecranon Process 38 cm   Olecranon Process 30.5 cm   10 cm Proximal to Ulnar Styloid Process 25.5 cm   Just Proximal to Ulnar Styloid Process 17.5 cm   Across Hand at [employer] 18.5 cm   At [place] of 2nd Digit 6.2 cm           Quick Dash - 10/05/15 0001    Open a tight or new jar Moderate difficulty   Do heavy household chores (wash walls, wash floors) Severe difficulty   Carry a shopping bag or briefcase No difficulty   Wash your back Severe difficulty   Use a knife to cut food No difficulty   Recreational activities in which you take some force or impact through your arm, shoulder, or hand (golf, hammering, tennis) Mild difficulty   During the past week, to what extent has your arm, shoulder or hand problem interfered with your normal social activities with family, friends, neighbors, or groups? Slightly   During the past week, to what extent has your arm, shoulder or hand problem limited your work or other regular daily activities Not at all   Arm, shoulder, or hand pain. None   Tingling (pins and needles) in your arm, shoulder, or hand Mild   Difficulty Sleeping Moderate difficulty   DASH Score 29.55 %               [facility] Adult PT Treatment/Exercise - 10/05/15 0001    Shoulder Exercises: Supine   External Rotation AAROM;Both;5 reps   Theraband Level (Shoulder External Rotation) Level 2 (Red)   Flexion AROM;Both;5 reps;Theraband   Theraband Level (Shoulder Flexion) Level 2 (Red)   Other Supine Exercises dowel exercise for both arms for shoulder flexion with knees  rotated to left with pillow under knees extra time to take a deep breath and really stretch through chest     Other Supine Exercises diagonal elevation with red theraband    Manual Therapy   Scapular Mobilization In Lt S/L in all planes                    Short Term Clinic Goals - 10/05/15 1546    CC Short Term Goal  #1   Title Patient with verbalize an understanding of lymphedema risk reduction precautions   Status Achieved   CC Short Term Goal  #2   Title Patient will improve right  and left shoulder abduction to 110 degrees so that she can reach the top of her head for dressing and grooming   Time 4   Period Weeks   Status Revised             Long Term Clinic Goals - 10/05/15 1547    CC Long Term Goal  #1   Title Patient will report a decrease in pain by 50% so they can perform daily activities with greater ease   Status Achieved   CC Long Term Goal  #2   Title Patient will be independent in a home exercise program   Time 8   Status On-going   CC Long Term Goal  #3   Title Patient will be know how to obtain and use compression garments for maintenance phase of treatment   Time 8   Status On-going   CC Long Term Goal  #4   Title Patient will decrease the DASH score to < 20   to demonstrate increased functional use of upper extremity   Baseline baseline 56.82, 29.55 on 10/04/2015   Time 8   Period Weeks   Status On-going   CC Long Term Goal  #5   Title Patient will have a circumferential reduction of   1      cm at    10      cm above the             olecranon process   Status Achieved   CC Long Term Goal  #6   Title Pt will have right and left shoulder flexion range of motion to 125 degrees so that she can reach into cabinets in her kitchen    Time 8   Period Weeks   Status New   Additional Goals   Additional Goals Yes            Plan - 10/05/15 1534    Clinical Impression Statement pt returns to PT after about 4 weeks.  Radiation has been  completed and she her skin has healed.  She is having difficulty getting away from work as her expectations to complete a full work load have increased.  Insurance has not yet approved a compression sleeve for her so she still wears temporary sleeve on right arm. She has increasead girth measurements in right forearm. She continues to have limited range of motion and strength in both arms with multple trigger points and muscle tightness with severe pain on stretching attempts.  Depsite this, her Quick DASH score is imiproved, demonstrating and improvement in arm function since it was last assessed.    Pt will benefit from skilled therapeutic intervention in order to improve on the following deficits Decreased range of motion;Pain;Impaired UE functional use;Increased muscle spasms;Increased fascial restricitons;Decreased scar mobility;Decreased strength;Increased edema   Rehab Potential Good   Clinical Impairments Affecting Rehab Potential PE earlier this year with UE swelling, finished with radiation 08/14/15. expainders in place with muscle tightness visible and palpable    PT Next Visit Plan Myofascial release to anterior, posteror trunk and axilla. work on active scapular mobility, table stretched, wall stretches and pulleys. consider  UE ranger, core activation work , nustep or recumbant bike.    Consulted and Agree with Plan of Care Patient        Problem List Patient Active Problem List   Diagnosis Date Noted  . Encounter for chemotherapy management 08/18/2015  . Breast cancer, right breast ([place]) 04/12/2015  . Arm DVT (  deep venous thromboembolism), acute ([place]) 12/03/2014  . Antineoplastic chemotherapy induced pancytopenia ([place]) 11/30/2014  . Pulmonary embolism ([place]) 11/29/2014  . Bronchitis 11/25/2014  . Tachycardia 11/25/2014  . Genetic testing 10/11/2014  . Monoallelic mutation of PALB2 gene 10/11/2014  . Constipation 10/05/2014  . Breast cancer of upper-outer quadrant of right female  breast ([place]) 08/10/2014   Donato Heinz. Owens Shark, PT  10/05/2015, 3:57 PM  [place], [place], [address] Phone: [phone]   Fax:  [phone]  Name: Jamie Edwards MRN: [id number] Date of Birth: 1971/04/07

## 2015-10-05 NOTE — Progress Notes (Signed)
CLINIC:  Cancer Survivorship   REASON FOR VISIT:  Routine follow-up post-treatment for a recent history of breast cancer.  BRIEF ONCOLOGIC HISTORY:    Breast cancer of upper-outer quadrant of right female breast ([place])   08/03/2014 Breast US Right breast: 4.3 x 1.7 x 2.9 cm irregular hypoechoic mass at the 10 o'clock position of the right breast 8 cm from the nipple. At least 3 enlarged right axillary lymph nodes are identified.   08/05/2014 Initial Biopsy Right breast core needle bx: Invasive ductal carcinoma, grade 3 ER 0%, PR 0%, HER-2 negative, Ki-67 90%.  Right axillary lymph node positive for metastatic mammary carcinoma   08/12/2014 Breast MRI 4.5x 7.5 x4 cm biopsy-proven right breast upper outer quadrant consistent with biopsy-proven cancer. 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    09/08/2014 - 02/02/2015 Neo-Adjuvant Chemotherapy Dose dense Adriamycin and Cytoxan x4 followed by weekly Taxol and carboplatin x4; Abraxane 8   02/01/2015 Breast MRI Marked improvement in the breast tumor and resolution of the right axillary lymph node, there are 3 new spots in the breast one beneath the area of unclear significance   04/12/2015 Definitive Surgery Bilateral Mastectomy; Right: IDC 5.7 cm grade 3, 3/8 LN pos (1 with micro met and 1 with ECE) Left Mastectomy: Benign   04/12/2015 Pathologic Stage Stage IIIA: ypT3 ypN1a   06/30/2015 - 08/14/2015 Radiation Therapy Adjuvant RT Isidore Moos): Right Chest Wall / 50.4 Gy in 28 fractions 2) Right Supraclavicular fossa/ 50.4 Gy in 28 fractions 3) Right Posterior Axillary boost / 8.876 Gy in 28 fractions 4) Right Chest Wall Scar boost / 10 Gy in  5 fractions   09/07/2015 -  Chemotherapy Adjuvant Xeloda 2000 mg by mouth twice a day 2 weeks on 1 week off 6 months    INTERVAL HISTORY:  Jamie Edwards presents to the [facility] today for our initial meeting to review her survivorship care plan detailing her treatment course for breast cancer, as well as monitoring long-term side effects of that treatment, education regarding health maintenance, screening, and overall wellness and health promotion.     Overall, Jamie Edwards reports feeling pretty well since completing her radiation therapy last month. She is not overly fatigued and reports that the skin changes overlying her right chest wall are improving, although her skin still remains darkened. She has begun her capecitabine (2 weeks on, 1 week off) and is tolerating it well thus far without fever, mouth sores, nausea, or diarrhea.  She did have some nausea at first, but states that this is well controlled with the zofran. She denies any rash along her hands or feet.  Jamie Edwards continues with neuropathy in her feet following her paclitaxel, but feels that this is improving slightly.  She denies any change along her mastectomy incisions.  She has begun treatment for lymphedema with our physical therapists and is tolerating this well.  She denies any headache, cough, shortness of breath or bone pain.  She has a good appetite and denies any weight loss.  Jamie Edwards has had increased hot flashes since completing chemotherapy and her periods have not resumed.  REVIEW OF SYSTEMS:  General: Hot flashes as above. Denies fever, chills, unintentional  weight loss, or night sweats. HEENT: Wears glasses. Denies visual changes, hearing loss or difficulty swallowing. Cardiac: Denies palpitations, chest pain, and lower extremity edema.  Respiratory: Denies wheeze or dyspnea on exertion.  GI: Denies abdominal pain, constipation, diarrhea, nausea, or vomiting.  GU: Denies dysuria, hematuria, vaginal  bleeding, vaginal discharge, or vaginal dryness.  Musculoskeletal: Denies joint or bone pain.  Neuro: Peripheral neuropathy in her bilateral feet as above. Denies recent fall. Skin: Denies rash, pruritis, or open wounds.  Psych: Some difficulty sleeping at night as well as anxiety, otherwise, denies depression or memory loss.   A 14-point review of systems was completed and was negative, except as noted above.   ONCOLOGY TREATMENT TEAM:  1. Surgeon:  Dr. Excell Seltzer at [facility]  2. Medical Oncologist: Dr. Lindi Adie 3. Radiation Oncologist: Dr. Isidore Moos    PAST MEDICAL/SURGICAL HISTORY:  Past Medical History  Diagnosis Date  . Increased heart rate 11/2014    pt. put on metoprolol  . Pulmonary embolism ([place]) 11/2014  . DVT (deep venous thrombosis) ([place]) 11/2014    BUE  . Cancer of right breast ([place]) 08/2014    S/P chemo 09/2014-02/02/2015  . Heart murmur     pt. states a doctor stated she had a slight murmur, no studies done. Present PCP has never mention it to her.   Past Surgical History  Procedure Laterality Date  . Mouth surgery      implants  . Portacath placement Right 08/31/2014    Procedure: INSERTION PORT-A-CATH WITH ULTRA SOUND GUIDENCE;  Surgeon: Erroll Luna, MD;  Location: [facility];  Service: General;  Laterality: Right;  . Mastectomy complete / simple w/ sentinel node biopsy Right 04/12/2015  . Mastectomy complete / simple Left 04/12/2015    PROPHYLATIC  . Breast reconstruction with placement of tissue expander and flex hd (acellular hydrated dermis) Bilateral 04/12/2015  . Port-a-cath removal  04/12/2015  . Breast biopsy Right 07/2014  . Simple mastectomy with axillary sentinel node biopsy Bilateral 04/12/2015    Procedure: BILATERAL TOTAL MASTECTOMY WITH RIGHT AXILLARY SENTINEL LYMPH  NODE BIOPSY, POSSIBLE AXILLARY DISSECTION, LEFT PROPHYLATIC;  Surgeon: Excell Seltzer, MD;  Location: [facility];  Service: General;  Laterality: Bilateral;  .  Port-a-cath removal Right 04/12/2015    Procedure: REMOVAL PORT-A-CATH;  Surgeon: Excell Seltzer, MD;  Location: [facility];  Service: General;  Laterality: Right;  . Breast reconstruction with placement of tissue expander and flex hd (acellular hydrated dermis) Bilateral 04/12/2015    Procedure: IMMEDIATE BILATERAL BREAST RECONSTRUCTION WITH PLACEMENT OF TISSUE EXPANDER AND FLEX HD (ACELLULAR HYDRATED DERMIS);  Surgeon: Theodoro Kos, DO;  Location: [facility];  Service: Plastics;  Laterality: Bilateral;     ALLERGIES:  Allergies  Allergen Reactions  . Erythromycin Base     Other reaction(s): GI Upset (intolerance)  . Penicillins Other (See Comments)    Stomach upset  . Zithromax [Azithromycin] Diarrhea     CURRENT MEDICATIONS:  Current Outpatient Prescriptions on File Prior to Visit  Medication Sig Dispense Refill  . acetaminophen (TYLENOL) 325 MG tablet Take 2 tablets (650 mg total) by mouth every 6 (six) hours as needed for mild pain (or Temp > 100).    . ALPRAZolam (XANAX) 0.25 MG tablet Take 0.25 mg by mouth 2 (two) times daily as needed for anxiety.    . capecitabine (XELODA) 500 MG tablet Take 4 tablets (2,000 mg total) by mouth 2 (two) times daily after a meal. Take for 14 days then off for 1  week Every 21 days 112 tablet 3  . diazepam (VALIUM) 2 MG tablet Take 1 tablet (2 mg total) by mouth every 6 (six) hours as needed for muscle spasms. (Patient not taking: Reported on 09/22/2015) 30 tablet 0  . HYDROcodone-acetaminophen (NORCO/VICODIN) 5-325 MG per tablet Take 1 tablet by mouth every 6 (six) hours as needed for moderate pain. Reported on 09/22/2015    . ondansetron (ZOFRAN) 8 MG tablet Take 1 tablet (8 mg total) by mouth every 8 (eight) hours as needed for nausea or vomiting. (Patient not taking: Reported on 09/22/2015) 30 tablet 3  . oxyCODONE-acetaminophen (ROXICET) 5-325 MG per tablet Take 1-2 tablets by mouth every 4 (four) hours as needed. (Patient not taking: Reported on  09/22/2015) 30 tablet 0  . polyethylene glycol (MIRALAX / GLYCOLAX) packet Take 17 g by mouth daily as needed for mild constipation or moderate constipation. (Patient not taking: Reported on 09/22/2015) 14 each 0  . senna (SENOKOT) 8.6 MG TABS tablet Take 1 tablet (8.6 mg total) by mouth 2 (two) times daily. 120 each 0  . silver sulfADIAZINE (SILVADENE) 1 % cream Apply 1 application topically 2 (two) times daily. Reported on 09/22/2015     No current facility-administered medications on file prior to visit.     ONCOLOGIC FAMILY HISTORY:  Family History  Problem Relation Age of Onset  . Breast cancer Mother 14  . Prostate cancer Father 27  . Esophageal cancer Paternal Uncle     smoker  . Cancer Cousin      GENETIC COUNSELING/TESTING: Yes, performed 08/29/2014. 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    SOCIAL HISTORY:  HIDAYA DANIEL is single and lives with her family in [place], [place].  She has 1 child. Ms. 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  She denies any current or history of tobacco or illicit drug use.  She has used alcohol in the past but none since beginning chemotherapy.   PHYSICAL EXAMINATION:  Vital Signs: Filed Vitals:   10/05/15 0915  BP: 110/75  Pulse: 100  Temp: 98.8 F (37.1 C)  Resp: 18   ECOG Performance Status: 0  General: Well-nourished, well-appearing female in no acute distress.  She is unaccompanied in clinic today.   HEENT: Head is atraumatic and normocephalic.  Pupils equal and reactive to light and accomodation. Conjunctivae clear without exudate.  Sclerae anicteric. Oral mucosa is pink, moist, and intact without lesions.  Oropharynx is pink without lesions or erythema.  Lymph: No cervical,  supraclavicular, infraclavicular, or axillary lymphadenopathy noted on palpation.  Cardiovascular: Regular rate and rhythm without murmurs, rubs, or gallops. Respiratory: Clear to auscultation bilaterally. Chest expansion symmetric without accessory muscle use on inspiration or expiration.  GI: Abdomen soft and round. No tenderness to palpation. Bowel sounds normoactive in 4 quadrants..   GU: Deferred.  Musculoskeletal: Muscle strength 5/5 in all extremities.   Neuro: No focal deficits. Steady gait.  Psych: Mood and affect normal and appropriate for situation.  Extremities: No edema, cyanosis, or clubbing.  Skin: Warm and dry. No open lesions noted.   LABORATORY DATA:  None for this visit.  DIAGNOSTIC IMAGING:  None for this visit.     ASSESSMENT AND PLAN:   1. 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  Jamie Edwards is doing well without clinical symptoms worrisome for disease recurrence. She will follow-up with her medical oncologist,  Dr. Lindi Adie, in January 2017 and her surgeon, Dr. Excell Seltzer, in February 2017, with history and physical examination per surveillance protocol. She will see her plastic surgeon later today as well as continue with her physical therapy.  She will continue her capecitabine at this time.  A comprehensive survivorship care plan and treatment summary was reviewed with the patient  today detailing her breast cancer diagnosis, treatment course, potential late/long-term effects of treatment, appropriate follow-up care with recommendations for the future, and patient education resources.  A copy of this summary, along with a letter will be sent to the patient's primary care provider via in basket message after today's visit.  Jamie Edwards is welcome to return to the Survivorship Clinic in the future, as needed; no follow-up will be scheduled at this time.    2. Cancer screening:  Due to Jamie Edwards's history (including PALB2 mutation) and age, she should receive screening for other cancers including skin cancers, colon cancer, and gynecologic cancers. Female carriers of PALB2 mutations are at increased risk for ovarian and pancreatic cancer (as well as breast cancer), although the risks for ovarian and pancreatic cancers have not yet been quantified.  As she is s/p bilateral mastectomy, she will not need breast MRIs as part of her surveillance. The information and recommendations are listed on the patient's comprehensive care plan/treatment summary and were reviewed in detail with the patient.    3. Health maintenance and wellness promotion: Jamie Edwards was encouraged to consume 5-7 servings of fruits and vegetables per day. We reviewed the "Nutrition Rainbow" handout, as well as discussed recommendations to maximize nutrition and minimize recurrence, such as increased intake of fruits, vegetables, lean proteins, and minimizing the intake of red meats and processed foods.  She was also encouraged to engage in moderate to vigorous exercise for 30 minutes per day most days of the week. We discussed the [employer] fitness program, which is designed for cancer survivors to help them become more physically fit after cancer treatments.  She was instructed to limit her alcohol consumption and continue to abstain from tobacco use.  A copy of the "Take Control of Your Health" brochure was given to  her reinforcing these recommendations.   4. Support services/counseling: It is not uncommon for this period of the patient's cancer care trajectory to be one of many emotions and stressors.  We discussed an opportunity for her to participate in the next session of [facility] ("Finding Your New Normal") support group series designed for patients after they have completed treatment.   Jamie Edwards was encouraged to take advantage of our many other support services programs, support groups, and/or counseling in coping with her new life as a cancer survivor after completing anti-cancer treatment.  She was offered support today through active listening and expressive supportive counseling.  She was given information regarding our available services and encouraged to contact  me with any questions or for help enrolling in any of our support group/programs.    A total of 50 minutes of face-to-face time was spent with this patient with greater than 50% of that time in counseling and care-coordination.   Sylvan Cheese, NP  Survivorship Program [facility] [phone]   Note: PRIMARY CARE PROVIDER Osborne Casco, [place] [phone]

## 2015-10-11 ENCOUNTER — Telehealth: Payer: Self-pay | Admitting: Pharmacist

## 2015-10-11 NOTE — Telephone Encounter (Signed)
10/11/15: Attempted to call patient for follow up of oral medication: Xeloda. Unable to reach patient. Left VM for patient to call back with any questions or issues.   Thank you,  Montel Clock, PharmD, [facility] [phone]

## 2015-10-12 ENCOUNTER — Ambulatory Visit: Payer: Commercial Managed Care - HMO | Attending: Radiation Oncology

## 2015-10-12 DIAGNOSIS — M25612 Stiffness of left shoulder, not elsewhere classified: Secondary | ICD-10-CM | POA: Diagnosis present

## 2015-10-12 DIAGNOSIS — Z9189 Other specified personal risk factors, not elsewhere classified: Secondary | ICD-10-CM | POA: Insufficient documentation

## 2015-10-12 DIAGNOSIS — C50411 Malignant neoplasm of upper-outer quadrant of right female breast: Secondary | ICD-10-CM | POA: Insufficient documentation

## 2015-10-12 DIAGNOSIS — L905 Scar conditions and fibrosis of skin: Secondary | ICD-10-CM | POA: Insufficient documentation

## 2015-10-12 DIAGNOSIS — M6281 Muscle weakness (generalized): Secondary | ICD-10-CM | POA: Diagnosis present

## 2015-10-12 DIAGNOSIS — M25611 Stiffness of right shoulder, not elsewhere classified: Secondary | ICD-10-CM | POA: Insufficient documentation

## 2015-10-12 NOTE — Therapy (Signed)
[place], [place], [address] Phone: [phone]   Fax:  [phone]  Physical Therapy Treatment  Patient Details  Name: Jamie Edwards MRN: [id number] Date of Birth: 09-01-1971 No Data Recorded  Encounter Date: 10/12/2015      PT End of Session - 10/12/15 0847    Visit Number 31   Number of Visits 49   Date for PT Re-Evaluation 12/05/15   PT Start Time 0802   PT Stop Time 8325   PT Time Calculation (min) 45 min   Activity Tolerance Patient tolerated treatment well   Behavior During Therapy [facility] for tasks assessed/performed      Past Medical History  Diagnosis Date  . Increased heart rate 11/2014    pt. put on metoprolol  . Pulmonary embolism ([place]) 11/2014  . DVT (deep venous thrombosis) ([place]) 11/2014    BUE  . Cancer of right breast ([place]) 08/2014    S/P chemo 09/2014-02/02/2015  . Heart murmur     pt. states a doctor stated she had a slight murmur, no studies done. Present PCP has never mention it to her.    Past Surgical History  Procedure Laterality Date  . Mouth surgery      implants  . Portacath placement Right 08/31/2014    Procedure: INSERTION PORT-A-CATH WITH ULTRA SOUND GUIDENCE;  Surgeon: Erroll Luna, MD;  Location: [facility];  Service: General;  Laterality: Right;  . Mastectomy complete / simple w/ sentinel node biopsy Right 04/12/2015  . Mastectomy complete / simple Left 04/12/2015    PROPHYLATIC  . Breast reconstruction with placement of tissue expander and flex hd (acellular hydrated dermis) Bilateral 04/12/2015  . Port-a-cath removal  04/12/2015  . Breast biopsy Right 07/2014  . Simple mastectomy with axillary sentinel node biopsy Bilateral 04/12/2015    Procedure: BILATERAL TOTAL MASTECTOMY WITH RIGHT AXILLARY SENTINEL LYMPH  NODE BIOPSY, POSSIBLE AXILLARY DISSECTION, LEFT PROPHYLATIC;  Surgeon: Excell Seltzer, MD;  Location: [facility];  Service: General;   Laterality: Bilateral;  . Port-a-cath removal Right 04/12/2015    Procedure: REMOVAL PORT-A-CATH;  Surgeon: Excell Seltzer, MD;  Location: [facility];  Service: General;  Laterality: Right;  . Breast reconstruction with placement of tissue expander and flex hd (acellular hydrated dermis) Bilateral 04/12/2015    Procedure: IMMEDIATE BILATERAL BREAST RECONSTRUCTION WITH PLACEMENT OF TISSUE EXPANDER AND FLEX HD (ACELLULAR HYDRATED DERMIS);  Surgeon: Theodoro Kos, DO;  Location: [facility];  Service: Plastics;  Laterality: Bilateral;    There were no vitals filed for this visit.  Visit Diagnosis:  At risk for lymphedema  Stiffness of shoulder joint, right  Stiffness of shoulder joint, left      Subjective Assessment - 10/12/15 0805    Subjective Still waiting to hear back from [employer]urance about my compression sleeve. My Rt scapula is still just so tender, but the pain is better than it used to be. I've gotten into a walking routine too this week, already have walked 2 days for 30 minutes each.    Currently in Pain? No/denies            [facility] PT Assessment - 10/12/15 0001    AROM   Right Shoulder Flexion 120 Degrees   Right Shoulder ABduction 102 Degrees   Left Shoulder Flexion 105 Degrees   Left Shoulder ABduction 72 Degrees                     [facility] Adult PT  Treatment/Exercise - 10/12/15 0001    Shoulder Exercises: Seated   Flexion AAROM;Both;5 reps  With UE Ranger   Abduction AAROM;Both;5 reps  UE Ranger   ABduction Limitations Cuing on Lt side to decrease scapular compensations   Shoulder Exercises: Pulleys   Flexion 2 minutes   Flexion Limitations Cuing to decrease scapular/trunk comprensations   ABduction 2 minutes   Shoulder Exercises: Therapy Ball   Flexion Other (comment)  Could only tolerate 8 reps of this today   Flexion Limitations Scapular compensations due to limited ROM   Manual Therapy   Scapular Mobilization In Lt S/L in all planes    Passive ROM In  supine for right shoulder IR, ER, abduction, and flexion to pts tolerance, and then in Lt S/L during scapular mobs into abduction and flexion                   Short Term Clinic Goals - 10/05/15 1546    CC Short Term Goal  #1   Title Patient with verbalize an understanding of lymphedema risk reduction precautions   Status Achieved   CC Short Term Goal  #2   Title Patient will improve right  and left shoulder abduction to 110 degrees so that she can reach the top of her head for dressing and grooming   Time 4   Period Weeks   Status Revised             Long Term Clinic Goals - 10/12/15 5852    CC Long Term Goal  #1   Title Patient will report a decrease in pain by 50% so they can perform daily activities with greater ease   Status Achieved   CC Long Term Goal  #2   Title Patient will be independent in a home exercise program   Status On-going   CC Long Term Goal  #3   Title Patient will be know how to obtain and use compression garments for maintenance phase of treatment  Pt awaitng insurance approval   Status On-going   CC Long Term Goal  #4   Title Patient will decrease the DASH score to < 20   to demonstrate increased functional use of upper extremity   Status On-going   CC Long Term Goal  #5   Title Patient will have a circumferential reduction of   1      cm at    10      cm above the             olecranon process   Status Achieved   CC Long Term Goal  #6   Title Pt will have right and left shoulder flexion range of motion to 125 degrees so that she can reach into cabinets in her kitchen   Rt 120 and Lt 105 degrees today, great gains with Rt   Status On-going            Plan - 10/12/15 0847    Clinical Impression Statement Pt did well with AAROM exercises that we resumed today. Though she does have pain at her Rt UEend ROM this does not limit her performance with exercises or PROM, able to go to her end ranges. Her Rt shoulder AROM improved today, very  good gains.    Pt will benefit from skilled therapeutic intervention in order to improve on the following deficits Decreased range of motion;Pain;Impaired UE functional use;Increased muscle spasms;Increased fascial restricitons;Decreased scar mobility;Decreased strength;Increased edema   Rehab Potential  Good   Clinical Impairments Affecting Rehab Potential PE earlier this year with UE swelling, finished with radiation 08/14/15. expainders in place with muscle tightness visible and palpable    PT Frequency 1x / week   PT Duration 8 weeks   PT Treatment/Interventions Therapeutic exercise;Manual techniques;Passive range of motion   PT Next Visit Plan Myofascial release to anterior, posteror trunk and axilla. work on active scapular mobility, table stretched, wall stretches and pulleys. consider  UE ranger, core activation work.   Consulted and Agree with Plan of Care Patient        Problem List Patient Active Problem List   Diagnosis Date Noted  . Encounter for chemotherapy management 08/18/2015  . Breast cancer, right breast ([place]) 04/12/2015  . Arm DVT (deep venous thromboembolism), acute ([place]) 12/03/2014  . Antineoplastic chemotherapy induced pancytopenia ([place]) 11/30/2014  . Pulmonary embolism ([place]) 11/29/2014  . Bronchitis 11/25/2014  . Tachycardia 11/25/2014  . Genetic testing 10/11/2014  . Monoallelic mutation of PALB2 gene 10/11/2014  . Constipation 10/05/2014  . Breast cancer of upper-outer quadrant of right female breast ([place]) 08/10/2014    Otelia Limes, PTA 10/12/2015, 8:51 AM  [place], [place], [address] Phone: [phone]   Fax:  [phone]  Name: TAWNIE EHRESMAN MRN: [id number] Date of Birth: 1970/11/21

## 2015-10-18 ENCOUNTER — Other Ambulatory Visit: Payer: Self-pay | Admitting: Hematology and Oncology

## 2015-10-18 DIAGNOSIS — C50411 Malignant neoplasm of upper-outer quadrant of right female breast: Secondary | ICD-10-CM

## 2015-10-18 MED ORDER — CAPECITABINE 500 MG PO TABS: 1000.0000 mg/m2 | ORAL_TABLET | Freq: Two times a day (BID) | ORAL | Status: DC

## 2015-10-23 ENCOUNTER — Ambulatory Visit (HOSPITAL_BASED_OUTPATIENT_CLINIC_OR_DEPARTMENT_OTHER): Payer: Commercial Managed Care - HMO | Admitting: Hematology and Oncology

## 2015-10-23 ENCOUNTER — Telehealth: Payer: Self-pay | Admitting: Hematology and Oncology

## 2015-10-23 ENCOUNTER — Other Ambulatory Visit (HOSPITAL_BASED_OUTPATIENT_CLINIC_OR_DEPARTMENT_OTHER): Payer: Commercial Managed Care - HMO

## 2015-10-23 ENCOUNTER — Ambulatory Visit: Payer: Commercial Managed Care - HMO

## 2015-10-23 ENCOUNTER — Encounter: Payer: Self-pay | Admitting: Hematology and Oncology

## 2015-10-23 VITALS — BP 108/64 | HR 109 | Temp 98.0°F | Resp 18 | Ht 68.0 in | Wt 219.1 lb

## 2015-10-23 DIAGNOSIS — M25611 Stiffness of right shoulder, not elsewhere classified: Secondary | ICD-10-CM

## 2015-10-23 DIAGNOSIS — Z171 Estrogen receptor negative status [ER-]: Secondary | ICD-10-CM | POA: Diagnosis not present

## 2015-10-23 DIAGNOSIS — L905 Scar conditions and fibrosis of skin: Secondary | ICD-10-CM

## 2015-10-23 DIAGNOSIS — C50411 Malignant neoplasm of upper-outer quadrant of right female breast: Secondary | ICD-10-CM

## 2015-10-23 DIAGNOSIS — Z9189 Other specified personal risk factors, not elsewhere classified: Secondary | ICD-10-CM | POA: Diagnosis not present

## 2015-10-23 DIAGNOSIS — C773 Secondary and unspecified malignant neoplasm of axilla and upper limb lymph nodes: Secondary | ICD-10-CM

## 2015-10-23 DIAGNOSIS — M25612 Stiffness of left shoulder, not elsewhere classified: Secondary | ICD-10-CM

## 2015-10-23 DIAGNOSIS — M6281 Muscle weakness (generalized): Secondary | ICD-10-CM

## 2015-10-23 LAB — COMPREHENSIVE METABOLIC PANEL
ALK PHOS: 150 U/L (ref 40–150)
ALT: 30 U/L (ref 0–55)
ANION GAP: 9 meq/L (ref 3–11)
AST: 40 U/L — ABNORMAL HIGH (ref 5–34)
Albumin: 3.5 g/dL (ref 3.5–5.0)
BUN: 10.5 mg/dL (ref 7.0–26.0)
CO2: 31 meq/L — AB (ref 22–29)
Calcium: 9.8 mg/dL (ref 8.4–10.4)
Chloride: 100 mEq/L (ref 98–109)
Creatinine: 0.9 mg/dL (ref 0.6–1.1)
EGFR: 90 mL/min/{1.73_m2} — AB (ref >90)
GLUCOSE: 130 mg/dL (ref 70–140)
POTASSIUM: 4 meq/L (ref 3.5–5.1)
SODIUM: 140 meq/L (ref 136–145)
Total Bilirubin: 0.52 mg/dL (ref 0.20–1.20)
Total Protein: 7.5 g/dL (ref 6.4–8.3)

## 2015-10-23 LAB — CBC WITH DIFFERENTIAL/PLATELET
BASO%: 0.5 % (ref 0.0–2.0)
BASOS ABS: 0 10*3/uL (ref 0.0–0.1)
EOS ABS: 0.1 10*3/uL (ref 0.0–0.5)
EOS%: 1.4 % (ref 0.0–7.0)
HCT: 36.4 % (ref 34.8–46.6)
HGB: 11.8 g/dL (ref 11.6–15.9)
LYMPH%: 24.2 % (ref 14.0–49.7)
MCH: 28.2 pg (ref 25.1–34.0)
MCHC: 32.4 g/dL (ref 31.5–36.0)
MCV: 87.1 fL (ref 79.5–101.0)
MONO#: 0.3 10*3/uL (ref 0.1–0.9)
MONO%: 6.5 % (ref 0.0–14.0)
NEUT#: 2.9 10*3/uL (ref 1.5–6.5)
NEUT%: 67.4 % (ref 38.4–76.8)
Platelets: 260 10*3/uL (ref 145–400)
RBC: 4.18 10*6/uL (ref 3.70–5.45)
RDW: 18.6 % — ABNORMAL HIGH (ref 11.2–14.5)
WBC: 4.3 10*3/uL (ref 3.9–10.3)
lymph#: 1.1 10*3/uL (ref 0.9–3.3)

## 2015-10-23 NOTE — Telephone Encounter (Signed)
appt made and avs pritned

## 2015-10-23 NOTE — Assessment & Plan Note (Signed)
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 Isidore Moos completed 08/14/2015  CurrentTreatment: Adjuvant Xeloda 1000 mg/m 2 weeks on 1 week off for 6 months started 09/07/2015  Xeloda toxicities: Denies any diarrhea, nausea, mouth sores, rash on hands or feet. She has excellent appetite and appears to be gaining weight. She will start cycle 2 10/02/2015. She is hoping to go on a cruise on 09/28/2015. I encouraged her to participate in the [facility] program.  PALB2: I discussed with her about meeting with her gynecologist regarding pelvic exams were ovarian cancer surveillance and screening.  Return to clinic in 3 weeks to start cycle 4

## 2015-10-23 NOTE — Telephone Encounter (Signed)
Per dr Lindi Adie 6 weeks is correct for lab and follow up

## 2015-10-23 NOTE — Therapy (Signed)
[place], [place], [address] Phone: [phone]   Fax:  [phone]  Physical Therapy Treatment  Patient Details  Name: Jamie Edwards MRN: [id number] Date of Birth: [birth year] No Data Recorded  Encounter Date: 10/23/2015      PT End of Session - 10/23/15 1209    Visit Number 32   Number of Visits 49   Date for PT Re-Evaluation 12/05/15   PT Start Time 1107   PT Stop Time 1201   PT Time Calculation (min) 54 min   Activity Tolerance Patient tolerated treatment well   Behavior During Therapy [facility] for tasks assessed/performed      Past Medical History  Diagnosis Date  . Increased heart rate 11/2014    pt. put on metoprolol  . Pulmonary embolism ([place]) 11/2014  . DVT (deep venous thrombosis) ([place]) 11/2014    BUE  . Cancer of right breast ([place]) 08/2014    S/P chemo 09/2014-02/02/2015  . Heart murmur     pt. states a doctor stated she had a slight murmur, no studies done. Present PCP has never mention it to her.    Past Surgical History  Procedure Laterality Date  . Mouth surgery      implants  . Portacath placement Right 08/31/2014    Procedure: INSERTION PORT-A-CATH WITH ULTRA SOUND GUIDENCE;  Surgeon: Erroll Luna, MD;  Location: [place];  Service: General;  Laterality: Right;  . Mastectomy complete / simple w/ sentinel node biopsy Right 04/12/2015  . Mastectomy complete / simple Left 04/12/2015    PROPHYLATIC  . Breast reconstruction with placement of tissue expander and flex hd (acellular hydrated dermis) Bilateral 04/12/2015  . Port-a-cath removal  04/12/2015  . Breast biopsy Right 07/2014  . Simple mastectomy with axillary sentinel node biopsy Bilateral 04/12/2015    Procedure: BILATERAL TOTAL MASTECTOMY WITH RIGHT AXILLARY SENTINEL LYMPH  NODE BIOPSY, POSSIBLE AXILLARY DISSECTION, LEFT PROPHYLATIC;  Surgeon: Excell Seltzer, MD;  Location: [place];  Service: General;   Laterality: Bilateral;  . Port-a-cath removal Right 04/12/2015    Procedure: REMOVAL PORT-A-CATH;  Surgeon: Excell Seltzer, MD;  Location: [place];  Service: General;  Laterality: Right;  . Breast reconstruction with placement of tissue expander and flex hd (acellular hydrated dermis) Bilateral 04/12/2015    Procedure: IMMEDIATE BILATERAL BREAST RECONSTRUCTION WITH PLACEMENT OF TISSUE EXPANDER AND FLEX HD (ACELLULAR HYDRATED DERMIS);  Surgeon: Theodoro Kos, DO;  Location: [place];  Service: Plastics;  Laterality: Bilateral;    There were no vitals filed for this visit.  Visit Diagnosis:  At risk for lymphedema  Stiffness of shoulder joint, right  Stiffness of shoulder joint, left  Cancer of upper-outer quadrant of female breast, right  Scar condition and fibrosis of skin  Generalized muscle weakness      Subjective Assessment - 10/23/15 1125    Subjective Haven't heard anything re: my sleeve being covered. Tightness is still there but feels like its getting a little better.  The "tight band" I've felt around my chest finally feels like it has slowling been relaxing over past 2 weeks. Been walking after work/end of day for 30 minutes almost every day too.   Pertinent History   prefers to go by the name "Jamie Edwards"  bilateral mastectomy with node biopsy ( unsure how many anywhere from 8-20) on july 6 with immediate expanders . drains still intact. Has had chemo that ended april 28 and had numbness and tingling mostly in toes that  has improved.  Plans to have radiatoin at the end of August.   Blood clot in lung in February of this year  She said she both arms swell when in the hospital for the blood clot.   Patient Stated Goals to improve arm mobility and strengthen both sides.    Currently in Pain? No/denies            [facility] PT Assessment - 10/23/15 0001    AROM   Right Shoulder Flexion 125 Degrees   Right Shoulder ABduction 105 Degrees   Left Shoulder Flexion 98 Degrees   Left  Shoulder ABduction 80 Degrees           LYMPHEDEMA/ONCOLOGY QUESTIONNAIRE - 10/23/15 1125    Right Upper Extremity Lymphedema   10 cm Proximal to Olecranon Process 38.4 cm   Olecranon Process 32 cm  Below lobule   10 cm Proximal to Ulnar Styloid Process 25.7 cm   Just Proximal to Ulnar Styloid Process 19.4 cm   Across Hand at [employer] 19.6 cm   At [place] of 2nd Digit 6.4 cm                  [facility] Adult PT Treatment/Exercise - 10/23/15 0001    Shoulder Exercises: Pulleys   Flexion 2 minutes   Flexion Limitations Cuing to decrease scapular/trunk comprensations   ABduction 2 minutes   Shoulder Exercises: Therapy Ball   Flexion 10 reps   Shoulder Exercises: ROM/Strengthening   Other ROM/Strengthening Exercises Finger ladder for bil UE abduction 5 reps each, very slow due to pts tightness   Other ROM/Strengthening Exercises Standing with back against wall for bil UE abduction with hands on wall to pts tolerance, hold 5 seconds, for 5 reps   Manual Therapy   Manual Lymphatic Drainage (MLD) In Supine: Short neck, diaphragmatic breathing, Lt axilla and Rt inguinal nodes, anterior inter-axillary and Rt axillo-inguinal anastomosis, and Rt upper arm, forearm, and then dorsal hand retracing steps all the way back to the anastomosis reviewing with pt througout.   Passive ROM Briefly at end of session for bil shoulders in to flexion and abduction                PT Education - 10/23/15 1220    Education provided Yes   Education Details Reviewed with pt self manual lymph drianage today while performing and instructed pt she may want to go ahead and get a sompression sleeve when she notices her arm is reduced instead of waiting on her insurance du eto her measurements were increased at a few areas today fairly significantly.    Person(s) Educated Patient   Methods Explanation;Demonstration   Comprehension Verbalized understanding           Short Term Clinic Goals -  10/05/15 1546    CC Short Term Goal  #1   Title Patient with verbalize an understanding of lymphedema risk reduction precautions   Status Achieved   CC Short Term Goal  #2   Title Patient will improve right  and left shoulder abduction to 110 degrees so that she can reach the top of her head for dressing and grooming   Time 4   Period Weeks   Status Revised             Long Term Clinic Goals - 10/23/15 1216    CC Long Term Goal  #2   Title Patient will be independent in a home exercise program   Status Partially  Met   CC Long Term Goal  #3   Title Patient will be know how to obtain and use compression garments for maintenance phase of treatment  Reconfirmedwith pt to call [facility] for an appt to get measured.   Status Partially Met   CC Long Term Goal  #4   Title Patient will decrease the DASH score to < 20   to demonstrate increased functional use of upper extremity   Status On-going   CC Long Term Goal  #6   Title Pt will have right and left shoulder flexion range of motion to 125 degrees so that she can reach into cabinets in her kitchen   Rt shoulder flexion was 125 degrees today 10/23/15   Status Partially Met            Plan - 10/23/15 1210    Clinical Impression Statement Pt still extremely tight with all bil shoulder ROM and but continues with gains this week thought not as drastic as they were last week(except Lt shoulder flexion was less today). After speaking with Leone Payor, PT about pts current function/AROM and pt seeming to present with frozen shoulder, she suggested we talk to pt about seeing an othropoedist at her next visit with [place] and decide frequency from there.    Pt will benefit from skilled therapeutic intervention in order to improve on the following deficits Decreased range of motion;Pain;Impaired UE functional use;Increased muscle spasms;Increased fascial restricitons;Decreased scar mobility;Decreased strength;Increased edema   Rehab  Potential Good   Clinical Impairments Affecting Rehab Potential PE earlier this year with UE swelling, finished with radiation 08/14/15. expainders in place with muscle tightness visible and palpable    PT Frequency 1x / week   PT Duration 8 weeks   PT Treatment/Interventions Therapeutic exercise;Manual techniques;Passive range of motion   PT Next Visit Plan Remeasure AROM next week and discuss with pt possibility of seeing an orthopedist for shoulders. Maybe be on hold until then and decide frequency upon pt seeing orthopedist. Remeasure circumference and pt to look into getting a compression sleeve soon whether she has heard back from insurance or not due to her edema is beginning to flutuate with more increases. Cont manual therapy and AAROM.    PT Home Exercise Plan Self manual lymph drainage   Consulted and Agree with Plan of Care Patient        Problem List Patient Active Problem List   Diagnosis Date Noted  . Encounter for chemotherapy management 08/18/2015  . Arm DVT (deep venous thromboembolism), acute ([place]) 12/03/2014  . Antineoplastic chemotherapy induced pancytopenia ([place]) 11/30/2014  . Pulmonary embolism ([place]) 11/29/2014  . Bronchitis 11/25/2014  . Tachycardia 11/25/2014  . Genetic testing 10/11/2014  . Monoallelic mutation of PALB2 gene 10/11/2014  . Constipation 10/05/2014  . Breast cancer of upper-outer quadrant of right female breast ([employer]) 08/10/2014    Jamie Edwards, Jamie Edwards 10/23/2015, 12:22 PM  [place], [place], [address] Phone: [phone]   Fax:  [phone]  Name: Jamie Edwards MRN: [id number] Date of Birth: Dec 04, 1970

## 2015-10-23 NOTE — Progress Notes (Signed)
Patient Care Team: Kelton Pillar, MD as PCP - General (Family Medicine) Nicholas Lose, MD as Consulting Physician (Hematology and Oncology) Excell Seltzer, MD as Consulting Physician (General Surgery) Eppie Gibson, MD as Attending Physician (Radiation Oncology) Holley Bouche, NP as Nurse Practitioner (Nurse Practitioner) Sylvan Cheese, NP as Nurse Practitioner (Hematology and Oncology)  DIAGNOSIS: Breast cancer of upper-outer quadrant of right female breast [facility])   Staging form: Breast, AJCC 7th Edition     Clinical: Stage IIIA (T3, N1, M0) - Unsigned       Staging comments: Staged at breast conference 11.11.15      Pathologic stage from 04/12/2015: Stage IIIA (yT3, N1a, cM0) - Unsigned   SUMMARY OF ONCOLOGIC HISTORY:   Breast cancer of upper-outer quadrant of right female breast ([place])   08/03/2014 Breast US Right breast: 4.3 x 1.7 x 2.9 cm irregular hypoechoic mass at the 10 o'clock position of the right breast 8 cm from the nipple. At least 3 enlarged right axillary lymph nodes are identified.   08/05/2014 Initial Biopsy Right breast core needle bx: Invasive ductal carcinoma, grade 3 ER 0%, PR 0%, HER-2 negative, Ki-67 90%.  Right axillary lymph node positive for metastatic mammary carcinoma   08/12/2014 Breast MRI 4.5x 7.5 x4 cm biopsy-proven right breast upper outer quadrant consistent with biopsy-proven cancer. 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    09/08/2014 - 02/02/2015 Neo-Adjuvant Chemotherapy Dose dense Adriamycin and Cytoxan x4 followed by weekly Taxol and carboplatin x4; Abraxane 8   02/01/2015 Breast MRI Marked improvement in the breast  tumor and resolution of the right axillary lymph node, there are 3 new spots in the breast one beneath the area of unclear significance   04/12/2015 Definitive Surgery Bilateral Mastectomy; Right: IDC 5.7 cm grade 3, 3/8 LN pos (1 with micro met and 1 with ECE) Left Mastectomy: Benign   04/12/2015 Pathologic Stage Stage IIIA: ypT3 ypN1a   06/30/2015 - 08/14/2015 Radiation Therapy Adjuvant RT Isidore Moos): Right Chest Wall / 50.4 Gy in 28 fractions 2) Right Supraclavicular fossa/ 50.4 Gy in 28 fractions 3) Right Posterior Axillary boost / 8.876 Gy in 28 fractions 4) Right Chest Wall Scar boost / 10 Gy in 5 fractions   09/07/2015 -  Chemotherapy Adjuvant Xeloda 2000 mg by mouth twice a day 2 weeks on 1 week off 6 months   10/05/2015 Survivorship Survivorship visit completed and copy of care plan given to patient.    CHIEF COMPLIANT: follow-up on Xeloda cycle 3  INTERVAL HISTORY: Jamie Edwards is a 45 year old with above-mentioned history of right breast cancer treated with neoadjuvant chemotherapy followed by bilateral mastectomies and was still found to have residual disease. She finished adjuvant radiation and is currently on adjuvant Xeloda. She appears to be tolerating Xeloda extremely well without any major problems or concerns. She does get headaches and nausea on the first of Xeloda which resolved subsequently.  REVIEW OF SYSTEMS:   Constitutional: Denies fevers, chills or abnormal weight loss Eyes: Denies blurriness of vision Ears, nose, mouth, throat, and face: Denies mucositis or sore throat Respiratory: Denies cough, dyspnea or wheezes Cardiovascular: Denies palpitation, chest discomfort Gastrointestinal:  Denies nausea, heartburn or change in bowel habits  Skin: Denies abnormal skin rashes Lymphatics: Denies new lymphadenopathy or easy bruising Neurological:Denies numbness, tingling or new weaknesses Behavioral/Psych: Mood is stable, no new changes  Extremities: No lower extremity  edema Breast:intermittent chest wall pains All other systems were reviewed with the patient and are negative.  I have reviewed the past medical history, past surgical history, social history and family history with the patient and they are unchanged from previous note.  ALLERGIES:  is allergic to erythromycin base; penicillins; and zithromax.  MEDICATIONS:  Current Outpatient Prescriptions  Medication Sig Dispense Refill  . acetaminophen (TYLENOL) 325 MG tablet Take 2 tablets (650 mg total) by mouth every 6 (six) hours as needed for mild pain (or Temp > 100). (Patient not taking: Reported on 10/23/2015)    . ALPRAZolam (XANAX) 0.25 MG tablet Take 0.25 mg by mouth 2 (two) times daily as needed for anxiety. Reported on 10/23/2015    . capecitabine (XELODA) 500 MG tablet Take 4 tablets (2,000 mg total) by mouth 2 (two) times daily after a meal. Take for 14 days then off for 1 week Every 21 days 112 tablet 3  . diazepam (VALIUM) 2 MG tablet Take 1 tablet (2 mg total) by mouth every 6 (six) hours as needed for muscle spasms. (Patient not taking: Reported on 09/22/2015) 30 tablet 0  . HYDROcodone-acetaminophen (NORCO/VICODIN) 5-325 MG per tablet Take 1 tablet by mouth every 6 (six) hours as needed for moderate pain. Reported on 10/23/2015    . ondansetron (ZOFRAN) 8 MG tablet Take 1 tablet (8 mg total) by mouth every 8 (eight) hours as needed for nausea or vomiting. 30 tablet 3  . oxyCODONE-acetaminophen (ROXICET) 5-325 MG per tablet Take 1-2 tablets by mouth every 4 (four) hours as needed. (Patient not taking: Reported on 09/22/2015) 30 tablet 0  . polyethylene glycol (MIRALAX / GLYCOLAX) packet Take 17 g by mouth daily as needed for mild constipation or moderate constipation. (Patient not taking: Reported on 09/22/2015) 14 each 0  . senna (SENOKOT) 8.6 MG TABS tablet Take 1 tablet (8.6 mg total) by mouth 2 (two) times daily. (Patient not taking: Reported on 10/23/2015) 120 each 0  . silver sulfADIAZINE  (SILVADENE) 1 % cream Apply 1 application topically 2 (two) times daily. Reported on 10/23/2015     No current facility-administered medications for this visit.    PHYSICAL EXAMINATION: ECOG PERFORMANCE STATUS: 1 - Symptomatic but completely ambulatory  Filed Vitals:   10/23/15 1431  BP: 108/64  Pulse: 109  Temp: 98 F (36.7 C)  Resp: 18   Filed Weights   10/23/15 1431  Weight: 219 lb 1.6 oz (99.383 kg)    GENERAL:alert, no distress and comfortable SKIN: skin color, texture, turgor are normal, no rashes or significant lesions EYES: normal, Conjunctiva are pink and non-injected, sclera clear OROPHARYNX:no exudate, no erythema and lips, buccal mucosa, and tongue normal  NECK: supple, thyroid normal size, non-tender, without nodularity LYMPH:  no palpable lymphadenopathy in the cervical, axillary or inguinal LUNGS: clear to auscultation and percussion with normal breathing effort HEART: regular rate & rhythm and no murmurs and no lower extremity edema ABDOMEN:abdomen soft, non-tender and normal bowel sounds MUSCULOSKELETAL:no cyanosis of digits and no clubbing  NEURO: alert & oriented x 3 with fluent speech, no focal motor/sensory deficits EXTREMITIES: No lower extremity edema  LABORATORY DATA:  I have reviewed the data as listed   Chemistry      Component Value Date/Time   NA 140 10/23/2015 1421   NA 135  04/12/2015 1635   K 4.0 10/23/2015 1421   K 4.3 04/12/2015 1635   CL 100* 04/12/2015 1635   CO2 31* 10/23/2015 1421   CO2 27 04/12/2015 1635   BUN 10.5 10/23/2015 1421   BUN 8 04/12/2015 1635   CREATININE 0.9 10/23/2015 1421   CREATININE 0.72 04/12/2015 1635      Component Value Date/Time   CALCIUM 9.8 10/23/2015 1421   CALCIUM 9.4 04/12/2015 1635   ALKPHOS 150 10/23/2015 1421   AST 40* 10/23/2015 1421   ALT 30 10/23/2015 1421   BILITOT 0.52 10/23/2015 1421       Lab Results  Component Value Date   WBC 4.3 10/23/2015   HGB 11.8 10/23/2015   HCT 36.4  10/23/2015   MCV 87.1 10/23/2015   PLT 260 10/23/2015   NEUTROABS 2.9 10/23/2015     ASSESSMENT & PLAN:  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 Isidore Moos completed 08/14/2015  CurrentTreatment: Adjuvant Xeloda 1000 mg/m 2 weeks on 1 week off for 6 months started 09/07/2015 today is cycle 3 day 1 Xeloda toxicities: 1. Headache with day 1 which results with day 2 2. Mild nausea on the first day of chemotherapy  Denies any diarrhea, nausea, mouth sores, rash on hands or feet. She has excellent appetite and appears to be gaining weight.  Survivorship: I discussed the importance of cutting down sugar intake as well as red meats and increasing fruits and vegetables. We also discussed the role of vitamin D and turmeric. We discussed the surveillance would be by doing physical exams. There is no role of routine imaging or lab work for surveillance.  PALB2: I discussed with her about meeting with her gynecologist regarding pelvic exams were ovarian cancer surveillance and screening.  Return to clinic in 6 weeks to start cycle 5    No orders of the defined types were placed in this encounter.   The patient has a good understanding of the overall plan. she agrees with it. she will call with any problems that may develop before the next visit here.   Rulon Eisenmenger, MD 10/23/2015

## 2015-10-24 ENCOUNTER — Telehealth: Payer: Self-pay | Admitting: Pharmacist

## 2015-10-24 NOTE — Telephone Encounter (Signed)
10/24/15: Patient's insurance now requiring specialty pharmacy to fill Xeloda (Allowed first fill and 1 refill to be filled at [facility]). Rx for xeloda now filled through [facility] (Phone # [phone], Fax #: [phone]). Patient is aware. All information and refills e-scribed to [facility] last week.   Thank you,  Montel Clock, PharmD, [facility]

## 2015-10-30 ENCOUNTER — Telehealth: Payer: Self-pay | Admitting: *Deleted

## 2015-10-30 ENCOUNTER — Ambulatory Visit: Payer: Commercial Managed Care - HMO | Admitting: Physical Therapy

## 2015-10-30 DIAGNOSIS — M25611 Stiffness of right shoulder, not elsewhere classified: Secondary | ICD-10-CM

## 2015-10-30 DIAGNOSIS — M25612 Stiffness of left shoulder, not elsewhere classified: Secondary | ICD-10-CM

## 2015-10-30 DIAGNOSIS — Z9189 Other specified personal risk factors, not elsewhere classified: Secondary | ICD-10-CM | POA: Diagnosis not present

## 2015-10-30 NOTE — Telephone Encounter (Signed)
Call received requesting to speak with her navigator.  Call transfererd to ext [phone].

## 2015-10-30 NOTE — Therapy (Signed)
[place], [place], [address] Phone: [phone]   Fax:  [phone]  Physical Therapy Treatment  Patient Details  Name: Jamie Edwards MRN: [id number] Date of Birth: 21-Jan-1971 No Data Recorded  Encounter Date: 10/30/2015      PT End of Session - 10/30/15 0857    Visit Number 33   Number of Visits 49   Date for PT Re-Evaluation 12/05/15   PT Start Time 0800   PT Stop Time 0850   PT Time Calculation (min) 50 min   Activity Tolerance Patient tolerated treatment well;Patient limited by pain   Behavior During Therapy [facility] for tasks assessed/performed      Past Medical History  Diagnosis Date  . Increased heart rate 11/2014    pt. put on metoprolol  . Pulmonary embolism ([place]) 11/2014  . DVT (deep venous thrombosis) ([place]) 11/2014    BUE  . Cancer of right breast ([place]) 08/2014    S/P chemo 09/2014-02/02/2015  . Heart murmur     pt. states a doctor stated she had a slight murmur, no studies done. Present PCP has never mention it to her.    Past Surgical History  Procedure Laterality Date  . Mouth surgery      implants  . Portacath placement Right 08/31/2014    Procedure: INSERTION PORT-A-CATH WITH ULTRA SOUND GUIDENCE;  Surgeon: Erroll Luna, MD;  Location: [place];  Service: General;  Laterality: Right;  . Mastectomy complete / simple w/ sentinel node biopsy Right 04/12/2015  . Mastectomy complete / simple Left 04/12/2015    PROPHYLATIC  . Breast reconstruction with placement of tissue expander and flex hd (acellular hydrated dermis) Bilateral 04/12/2015  . Port-a-cath removal  04/12/2015  . Breast biopsy Right 07/2014  . Simple mastectomy with axillary sentinel node biopsy Bilateral 04/12/2015    Procedure: BILATERAL TOTAL MASTECTOMY WITH RIGHT AXILLARY SENTINEL LYMPH  NODE BIOPSY, POSSIBLE AXILLARY DISSECTION, LEFT PROPHYLATIC;  Surgeon: Excell Seltzer, MD;  Location: [place];   Service: General;  Laterality: Bilateral;  . Port-a-cath removal Right 04/12/2015    Procedure: REMOVAL PORT-A-CATH;  Surgeon: Excell Seltzer, MD;  Location: [place];  Service: General;  Laterality: Right;  . Breast reconstruction with placement of tissue expander and flex hd (acellular hydrated dermis) Bilateral 04/12/2015    Procedure: IMMEDIATE BILATERAL BREAST RECONSTRUCTION WITH PLACEMENT OF TISSUE EXPANDER AND FLEX HD (ACELLULAR HYDRATED DERMIS);  Surgeon: Theodoro Kos, DO;  Location: [place];  Service: Plastics;  Laterality: Bilateral;    There were no vitals filed for this visit.  Visit Diagnosis:  At risk for lymphedema  Stiffness of shoulder joint, right  Stiffness of shoulder joint, left      Subjective Assessment - 10/30/15 0804    Subjective Nothing new today.  Plans to call [facility] one more time about compression sleeve and will make an appointment for that.   Currently in Pain? No/denies            [facility] PT Assessment - 10/30/15 0001    AROM   Right Shoulder Flexion 116 Degrees   Right Shoulder ABduction 81 Degrees   Left Shoulder Flexion 106 Degrees   Left Shoulder ABduction 82 Degrees           LYMPHEDEMA/ONCOLOGY QUESTIONNAIRE - 10/30/15 0816    Right Upper Extremity Lymphedema   10 cm Proximal to Olecranon Process 38.4 cm   Olecranon Process 30.5 cm   10 cm Proximal to Ulnar  Styloid Process 25.9 cm   Just Proximal to Ulnar Styloid Process 20 cm   Across Hand at [employer] 19.5 cm   At [place] of 2nd Digit 6.5 cm                  [facility] Adult PT Treatment/Exercise - 10/30/15 0001    Shoulder Exercises: Standing   Other Standing Exercises finger ladder for abduction x 5 each side   Shoulder Exercises: Pulleys   Flexion 3 minutes   Shoulder Exercises: Therapy Ball   Flexion 10 reps   Manual Therapy   Manual Lymphatic Drainage (MLD)     Passive ROM In supine for right and left shoulders in IR, ER, flexion, and abduction; tried  contract-relax for left shoulder ER, with little benefit.                   Short Term Clinic Goals - 10/05/15 1546    CC Short Term Goal  #1   Title Patient with verbalize an understanding of lymphedema risk reduction precautions   Status Achieved   CC Short Term Goal  #2   Title Patient will improve right  and left shoulder abduction to 110 degrees so that she can reach the top of her head for dressing and grooming   Time 4   Period Weeks   Status Revised             Long Term Clinic Goals - 10/30/15 4193    CC Long Term Goal  #2   Title Patient will be independent in a home exercise program   Status Partially Met   CC Long Term Goal  #3   Title Patient will be know how to obtain and use compression garments for maintenance phase of treatment   Status Partially Met   CC Long Term Goal  #6   Title Pt will have right and left shoulder flexion range of motion to 125 degrees so that she can reach into cabinets in her kitchen    Status On-going            Plan - 10/30/15 0857    Clinical Impression Statement Patient's right arm circumferences are more or less as they have been the past couple of weeks.  Shoulder AROM remains significantly limited and painful, though patient denies pain at times other than when being stretched, and describes tightness more than pain.  She has limited tolerance for stretching.  Discussed with her that it may benefit her to see a sports medicine or orthopedic physician to have her shoulder limitations diagnosed, and it seems that she will pursue this.  Advised her to make that physician aware of her axillary node dissection and lymphedema.   Pt will benefit from skilled therapeutic intervention in order to improve on the following deficits Decreased range of motion;Pain;Impaired UE functional use;Increased muscle spasms;Increased fascial restricitons;Decreased scar mobility;Decreased strength;Increased edema   Rehab Potential Good   PT  Frequency Other (comment)  once every two weeks (depending on other medical appointments, due to constraints for being off work to go to appts.)   PT Duration 8 weeks   PT Treatment/Interventions Therapeutic exercise;Manual techniques;Passive range of motion;Manual lymph drainage   PT Next Visit Plan Patient will check her schedule to see if she can come back before her next scheduled appointment on 11/27/15.  I feel she should continue since we see her infrequently so part of the goal is to monitor swelling and ROM, as  well as progress HEP, and since she may or may not see a sports medicine physician soon.   PT Home Exercise Plan Self manual lymph drainage, ROM   Recommended Other Services to pursue getting a compression sleeve   Consulted and Agree with Plan of Care Patient        Problem List Patient Active Problem List   Diagnosis Date Noted  . Encounter for chemotherapy management 08/18/2015  . Arm DVT (deep venous thromboembolism), acute ([place]) 12/03/2014  . Antineoplastic chemotherapy induced pancytopenia ([place]) 11/30/2014  . Pulmonary embolism ([place]) 11/29/2014  . Bronchitis 11/25/2014  . Tachycardia 11/25/2014  . Genetic testing 10/11/2014  . Monoallelic mutation of PALB2 gene 10/11/2014  . Constipation 10/05/2014  . Breast cancer of upper-outer quadrant of right female breast ([place]) 08/10/2014    Samad Thon 10/30/2015, 9:04 AM  [place], [place], [address] Phone: [phone]   Fax:  [phone]  Name: Jamie Edwards MRN: [id number] Date of Birth: March 12, 1971    Serafina Royals, PT 10/30/2015 9:04 AM

## 2015-10-31 ENCOUNTER — Encounter: Payer: Self-pay | Admitting: *Deleted

## 2015-10-31 NOTE — Progress Notes (Signed)
  Oncology Nurse Navigator Documentation  Navigator Location: [facility] (10/31/15 1100) Navigator Encounter Type: Other (10/31/15 1100)                              Specialty Items/DME: Glorious Peach (Faxed order for compression sleeve to [facility]) (10/31/15 1100)           Time Spent with Patient: 15 (10/31/15 1100)

## 2015-11-03 ENCOUNTER — Other Ambulatory Visit: Payer: Self-pay | Admitting: *Deleted

## 2015-11-03 MED ORDER — UNABLE TO FIND: 1.0000 | Status: AC | PRN

## 2015-11-06 ENCOUNTER — Ambulatory Visit: Payer: Commercial Managed Care - HMO | Admitting: Physical Therapy

## 2015-11-07 ENCOUNTER — Encounter: Payer: Self-pay | Admitting: Pharmacist

## 2015-11-07 NOTE — Progress Notes (Signed)
Oral Chemotherapy Follow-Up Form  Original Start date of oral chemotherapy: _12/1/17_   Called patient today to follow up regarding patient's oral chemotherapy medication: _Xeloda_  Pt is doing well today. No issues. Rx for Xeloda now required to be filled through [facility]. Ms. Gironda will receive next refill in the mail this Thursday 11/09/15. No issues to report. No side effects other than mild fatigue. No missed doses   Pt reports _0___ tablets/doses missed in the last week/month.    Pt reports the following side effects: _mild fatigue (stable - not new)    Will follow up and call patient again in _1 month_   Thank you,  Montel Clock, PharmD, [facility]

## 2015-11-27 ENCOUNTER — Ambulatory Visit: Payer: Commercial Managed Care - HMO | Admitting: Physical Therapy

## 2015-12-03 NOTE — Assessment & Plan Note (Signed)
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 Isidore Moos completed 08/14/2015  CurrentTreatment: Adjuvant Xeloda 1000 mg/m 2 weeks on 1 week off for 6 months started 09/07/2015 today is cycle 5 day 1 Xeloda toxicities: 1. Headache with day 1 which results with day 2 2. Mild nausea on the first day of chemotherapy  Denies any diarrhea, nausea, mouth sores, rash on hands or feet. She has excellent appetite and appears to be gaining weight.  Survivorship: I discussed the importance of cutting down sugar intake as well as red meats and increasing fruits and vegetables.   PALB2: I discussed with her about meeting with her gynecologist regarding pelvic exams were ovarian cancer surveillance and screening.  Return to clinic in 6 weeks to start cycle 7

## 2015-12-04 ENCOUNTER — Ambulatory Visit (HOSPITAL_BASED_OUTPATIENT_CLINIC_OR_DEPARTMENT_OTHER): Payer: Commercial Managed Care - HMO | Admitting: Hematology and Oncology

## 2015-12-04 ENCOUNTER — Encounter: Payer: Self-pay | Admitting: Hematology and Oncology

## 2015-12-04 ENCOUNTER — Telehealth: Payer: Self-pay | Admitting: Hematology and Oncology

## 2015-12-04 ENCOUNTER — Other Ambulatory Visit (HOSPITAL_BASED_OUTPATIENT_CLINIC_OR_DEPARTMENT_OTHER): Payer: Commercial Managed Care - HMO

## 2015-12-04 VITALS — BP 114/76 | HR 98 | Temp 98.1°F | Resp 18 | Ht 68.0 in | Wt 220.2 lb

## 2015-12-04 DIAGNOSIS — C50411 Malignant neoplasm of upper-outer quadrant of right female breast: Secondary | ICD-10-CM | POA: Diagnosis not present

## 2015-12-04 DIAGNOSIS — C773 Secondary and unspecified malignant neoplasm of axilla and upper limb lymph nodes: Secondary | ICD-10-CM

## 2015-12-04 DIAGNOSIS — Z171 Estrogen receptor negative status [ER-]: Secondary | ICD-10-CM

## 2015-12-04 LAB — CBC WITH DIFFERENTIAL/PLATELET
BASO%: 0.7 % (ref 0.0–2.0)
Basophils Absolute: 0 10*3/uL (ref 0.0–0.1)
EOS%: 1.4 % (ref 0.0–7.0)
Eosinophils Absolute: 0.1 10*3/uL (ref 0.0–0.5)
HCT: 37.3 % (ref 34.8–46.6)
HGB: 12.1 g/dL (ref 11.6–15.9)
LYMPH#: 1 10*3/uL (ref 0.9–3.3)
LYMPH%: 19 % (ref 14.0–49.7)
MCH: 29.6 pg (ref 25.1–34.0)
MCHC: 32.3 g/dL (ref 31.5–36.0)
MCV: 91.5 fL (ref 79.5–101.0)
MONO#: 0.6 10*3/uL (ref 0.1–0.9)
MONO%: 10.9 % (ref 0.0–14.0)
NEUT#: 3.7 10*3/uL (ref 1.5–6.5)
NEUT%: 68 % (ref 38.4–76.8)
Platelets: 243 10*3/uL (ref 145–400)
RBC: 4.08 10*6/uL (ref 3.70–5.45)
RDW: 24.7 % — ABNORMAL HIGH (ref 11.2–14.5)
WBC: 5.4 10*3/uL (ref 3.9–10.3)

## 2015-12-04 LAB — COMPREHENSIVE METABOLIC PANEL
ALT: 17 U/L (ref 0–55)
AST: 27 U/L (ref 5–34)
Albumin: 3.7 g/dL (ref 3.5–5.0)
Alkaline Phosphatase: 157 U/L — ABNORMAL HIGH (ref 40–150)
Anion Gap: 10 mEq/L (ref 3–11)
BUN: 9 mg/dL (ref 7.0–26.0)
CHLORIDE: 102 meq/L (ref 98–109)
CO2: 28 meq/L (ref 22–29)
CREATININE: 0.9 mg/dL (ref 0.6–1.1)
Calcium: 9.5 mg/dL (ref 8.4–10.4)
EGFR: >90 mL/min/{1.73_m2} (ref >90)
GLUCOSE: 113 mg/dL (ref 70–140)
Potassium: 3.8 mEq/L (ref 3.5–5.1)
SODIUM: 139 meq/L (ref 136–145)
Total Bilirubin: 0.75 mg/dL (ref 0.20–1.20)
Total Protein: 7.6 g/dL (ref 6.4–8.3)

## 2015-12-04 NOTE — Telephone Encounter (Signed)
Gave patient avs report and appointments for April. VG out of office week of 4/10.

## 2015-12-04 NOTE — Progress Notes (Signed)
Patient Care Team: Kelton Pillar, MD as PCP - General (Family Medicine) Nicholas Lose, MD as Consulting Physician (Hematology and Oncology) Excell Seltzer, MD as Consulting Physician (General Surgery) Eppie Gibson, MD as Attending Physician (Radiation Oncology) Holley Bouche, NP as Nurse Practitioner (Nurse Practitioner) Sylvan Cheese, NP as Nurse Practitioner (Hematology and Oncology)  DIAGNOSIS: Breast cancer of upper-outer quadrant of right female breast [facility])   Staging form: Breast, AJCC 7th Edition     Clinical: Stage IIIA (T3, N1, M0) - Unsigned       Staging comments: Staged at breast conference 11.11.15      Pathologic stage from 04/12/2015: Stage IIIA (yT3, N1a, cM0) - Unsigned   SUMMARY OF ONCOLOGIC HISTORY:   Breast cancer of upper-outer quadrant of right female breast ([place])   08/03/2014 Breast US Right breast: 4.3 x 1.7 x 2.9 cm irregular hypoechoic mass at the 10 o'clock position of the right breast 8 cm from the nipple. At least 3 enlarged right axillary lymph nodes are identified.   08/05/2014 Initial Biopsy Right breast core needle bx: Invasive ductal carcinoma, grade 3 ER 0%, PR 0%, HER-2 negative, Ki-67 90%.  Right axillary lymph node positive for metastatic mammary carcinoma   08/12/2014 Breast MRI 4.5x 7.5 x4 cm biopsy-proven right breast upper outer quadrant consistent with biopsy-proven cancer. 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    09/08/2014 - 02/02/2015 Neo-Adjuvant Chemotherapy Dose dense Adriamycin and Cytoxan x4 followed by weekly Taxol and carboplatin x4; Abraxane 8   02/01/2015 Breast MRI Marked improvement in the breast  tumor and resolution of the right axillary lymph node, there are 3 new spots in the breast one beneath the area of unclear significance   04/12/2015 Definitive Surgery Bilateral Mastectomy; Right: IDC 5.7 cm grade 3, 3/8 LN pos (1 with micro met and 1 with ECE) Left Mastectomy: Benign   04/12/2015 Pathologic Stage Stage IIIA: ypT3 ypN1a   06/30/2015 - 08/14/2015 Radiation Therapy Adjuvant RT Isidore Moos): Right Chest Wall / 50.4 Gy in 28 fractions 2) Right Supraclavicular fossa/ 50.4 Gy in 28 fractions 3) Right Posterior Axillary boost / 8.876 Gy in 28 fractions 4) Right Chest Wall Scar boost / 10 Gy in 5 fractions   09/07/2015 -  Chemotherapy Adjuvant Xeloda 2000 mg by mouth twice a day 2 weeks on 1 week off 6 months   10/05/2015 Survivorship Survivorship visit completed and copy of care plan given to patient.    CHIEF COMPLIANT: follow-up on oral Xeloda  INTERVAL HISTORY: Jamie Edwards is a 45 year old with above-mentioned history of triple negative breast cancer who had a moderate residual disease after neoadjuvant chemotherapy and is currently on Xeloda. She had previously completed adjuvant radiation. She has been tolerating Xeloda moderately well. She does have fatigue and occasional loose stools. She currently complains of neuropathy which is not improving. She denies any nausea or vomiting.  REVIEW OF SYSTEMS:   Constitutional: Denies fevers, chills or abnormal weight loss Eyes: Denies blurriness of vision Ears, nose, mouth, throat, and face: Denies mucositis or sore throat Respiratory: Denies cough, dyspnea or wheezes Cardiovascular: Denies palpitation, chest discomfort Gastrointestinal:  Denies nausea, heartburn or change in bowel habits Skin: Denies abnormal skin  rashes Lymphatics: Denies new lymphadenopathy or easy bruising Neurological:neuropathy in the feet accompanied by occasional swelling Behavioral/Psych: Mood is stable, no new changes  Extremities: occasional lower extremity  edema Breast:  denies any pain or lumps or nodules in either breasts All other systems were reviewed with the patient and are negative.  I have reviewed the past medical history, past surgical history, social history and family history with the patient and they are unchanged from previous note.  ALLERGIES:  is allergic to erythromycin base; penicillins; and zithromax.  MEDICATIONS:  Current Outpatient Prescriptions  Medication Sig Dispense Refill  . acetaminophen (TYLENOL) 325 MG tablet Take 2 tablets (650 mg total) by mouth every 6 (six) hours as needed for mild pain (or Temp > 100). (Patient not taking: Reported on 10/23/2015)    . ALPRAZolam (XANAX) 0.25 MG tablet Take 0.25 mg by mouth 2 (two) times daily as needed for anxiety. Reported on 10/23/2015    . capecitabine (XELODA) 500 MG tablet Take 4 tablets (2,000 mg total) by mouth 2 (two) times daily after a meal. Take for 14 days then off for 1 week Every 21 days 112 tablet 3  . diazepam (VALIUM) 2 MG tablet Take 1 tablet (2 mg total) by mouth every 6 (six) hours as needed for muscle spasms. (Patient not taking: Reported on 09/22/2015) 30 tablet 0  . HYDROcodone-acetaminophen (NORCO/VICODIN) 5-325 MG per tablet Take 1 tablet by mouth every 6 (six) hours as needed for moderate pain. Reported on 10/23/2015    . ondansetron (ZOFRAN) 8 MG tablet Take 1 tablet (8 mg total) by mouth every 8 (eight) hours as needed for nausea or vomiting. 30 tablet 3  . oxyCODONE-acetaminophen (ROXICET) 5-325 MG per tablet Take 1-2 tablets by mouth every 4 (four) hours as needed. (Patient not taking: Reported on 09/22/2015) 30 tablet 0  . polyethylene glycol (MIRALAX / GLYCOLAX) packet Take 17 g by mouth daily as needed for mild constipation or moderate constipation. (Patient not taking: Reported on 09/22/2015) 14 each 0  . senna (SENOKOT) 8.6 MG TABS tablet Take 1 tablet (8.6 mg total) by mouth 2 (two) times daily. (Patient not taking: Reported on 10/23/2015) 120 each 0   . silver sulfADIAZINE (SILVADENE) 1 % cream Apply 1 application topically 2 (two) times daily. Reported on 10/23/2015    . UNABLE TO FIND 1 each by Other route as needed. Dispense Class 1 preventative compression sleeve 20-68m/Hg for this patient with a hx of breast CA s/p surgery 1 each 1   No current facility-administered medications for this visit.    PHYSICAL EXAMINATION: ECOG PERFORMANCE STATUS: 1 - Symptomatic but completely ambulatory  Filed Vitals:   12/04/15 1411  BP: 114/76  Pulse: 98  Temp: 98.1 F (36.7 C)  Resp: 18   Filed Weights   12/04/15 1411  Weight: 220 lb 3.2 oz (99.882 kg)    GENERAL:alert, no distress and comfortable SKIN: skin color, texture, turgor are normal, no rashes or significant lesions EYES: normal, Conjunctiva are pink and non-injected, sclera clear OROPHARYNX:no exudate, no erythema and lips, buccal mucosa, and tongue normal  NECK: supple, thyroid normal size, non-tender, without nodularity LYMPH:  no palpable lymphadenopathy in the cervical, axillary or inguinal LUNGS: clear to auscultation and percussion with normal breathing effort HEART: regular rate & rhythm and no murmurs and no lower extremity edema ABDOMEN:abdomen soft, non-tender and normal bowel sounds MUSCULOSKELETAL:no cyanosis of digits and no clubbing  NEURO: alert & oriented x 3 with fluent speech, no focal  motor/sensory deficits EXTREMITIES: No lower extremity edema  LABORATORY DATA:  I have reviewed the data as listed   Chemistry      Component Value Date/Time   NA 140 10/23/2015 1421   NA 135 04/12/2015 1635   K 4.0 10/23/2015 1421   K 4.3 04/12/2015 1635   CL 100* 04/12/2015 1635   CO2 31* 10/23/2015 1421   CO2 27 04/12/2015 1635   BUN 10.5 10/23/2015 1421   BUN 8 04/12/2015 1635   CREATININE 0.9 10/23/2015 1421   CREATININE 0.72 04/12/2015 1635      Component Value Date/Time   CALCIUM 9.8 10/23/2015 1421   CALCIUM 9.4 04/12/2015 1635   ALKPHOS 150 10/23/2015  1421   AST 40* 10/23/2015 1421   ALT 30 10/23/2015 1421   BILITOT 0.52 10/23/2015 1421      Lab Results  Component Value Date   WBC 5.4 12/04/2015   HGB 12.1 12/04/2015   HCT 37.3 12/04/2015   MCV 91.5 12/04/2015   PLT 243 12/04/2015   NEUTROABS 3.7 12/04/2015   ASSESSMENT & PLAN:  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 Isidore Moos completed 08/14/2015  CurrentTreatment: Adjuvant Xeloda 1000 mg/m 2 weeks on 1 week off for 6 months started 09/07/2015 today is cycle 5 day 1 Xeloda toxicities: 1. Headache intermittently. 2. Mild nausea on the first day of chemotherapy  Denies any diarrhea, nausea, mouth sores, rash on hands or feet. She has excellent appetite and appears to be gaining weight.  Survivorship: I discussed the importance of cutting down sugar intake as well as red meats and increasing fruits and vegetables.   PALB2: I discussed with her about meeting with her gynecologist regarding pelvic exams were ovarian cancer surveillance and screening.  I discussed with her that she may be eligible to participate in a clinical trial using Pembrolizumab immunotherapy that we are hoping to open our cancer center very soon.  Return to clinic in 6 weeks to start cycle 7   No orders of the defined types were placed in this encounter.   The patient has a good understanding of the overall plan. she agrees with it. she will call with any problems  that may develop before the next visit here.   Rulon Eisenmenger, MD 12/04/2015

## 2015-12-08 ENCOUNTER — Ambulatory Visit: Payer: Commercial Managed Care - HMO | Attending: Radiation Oncology | Admitting: Physical Therapy

## 2015-12-08 ENCOUNTER — Telehealth: Payer: Self-pay | Admitting: Hematology and Oncology

## 2015-12-08 DIAGNOSIS — M25611 Stiffness of right shoulder, not elsewhere classified: Secondary | ICD-10-CM | POA: Insufficient documentation

## 2015-12-08 DIAGNOSIS — Z9189 Other specified personal risk factors, not elsewhere classified: Secondary | ICD-10-CM | POA: Diagnosis present

## 2015-12-08 DIAGNOSIS — M25612 Stiffness of left shoulder, not elsewhere classified: Secondary | ICD-10-CM | POA: Insufficient documentation

## 2015-12-08 NOTE — Telephone Encounter (Signed)
Patient called to change 4/17 appt to 4/18 due pt has work related issue on the 17th

## 2015-12-08 NOTE — Therapy (Signed)
[facility] [address] [place], [place], [address] Phone: [phone]   Fax:  [phone]  Physical Therapy Treatment  Patient Details  Name: Jamie Edwards MRN: [id number] Date of Birth: 08/26/1971 No Data Recorded  Encounter Date: 12/08/2015      PT End of Session - 12/08/15 1222    Visit Number 34   Number of Visits 49   Date for PT Re-Evaluation 03/07/16   PT Start Time 0802   PT Stop Time 0847   PT Time Calculation (min) 45 min   Activity Tolerance Patient tolerated treatment well;Patient limited by pain   Behavior During Therapy [facility] for tasks assessed/performed      Past Medical History  Diagnosis Date  . Increased heart rate 11/2014    pt. put on metoprolol  . Pulmonary embolism (HCC) 11/2014  . DVT (deep venous thrombosis) (HCC) 11/2014    BUE  . Cancer of right breast (HCC) 08/2014    S/P chemo 09/2014-02/02/2015  . Heart murmur     pt. states a doctor stated she had a slight murmur, no studies done. Present PCP has never mention it to her.    Past Surgical History  Procedure Laterality Date  . Mouth surgery      implants  . Portacath placement Right 08/31/2014    Procedure: INSERTION PORT-A-CATH WITH ULTRA SOUND GUIDENCE;  Surgeon: Harriette Bouillon, MD;  Location: [facility];  Service: General;  Laterality: Right;  . Mastectomy complete / simple w/ sentinel node biopsy Right 04/12/2015  . Mastectomy complete / simple Left 04/12/2015    PROPHYLATIC  . Breast reconstruction with placement of tissue expander and flex hd (acellular hydrated dermis) Bilateral 04/12/2015  . Port-a-cath removal  04/12/2015  . Breast biopsy Right 07/2014  . Simple mastectomy with axillary sentinel node biopsy Bilateral 04/12/2015    Procedure: BILATERAL TOTAL MASTECTOMY WITH RIGHT AXILLARY SENTINEL LYMPH  NODE BIOPSY, POSSIBLE AXILLARY DISSECTION, LEFT PROPHYLATIC;  Surgeon: Glenna Fellows, MD;  Location: [facility] OR;   Service: General;  Laterality: Bilateral;  . Port-a-cath removal Right 04/12/2015    Procedure: REMOVAL PORT-A-CATH;  Surgeon: Glenna Fellows, MD;  Location: [facility] OR;  Service: General;  Laterality: Right;  . Breast reconstruction with placement of tissue expander and flex hd (acellular hydrated dermis) Bilateral 04/12/2015    Procedure: IMMEDIATE BILATERAL BREAST RECONSTRUCTION WITH PLACEMENT OF TISSUE EXPANDER AND FLEX HD (ACELLULAR HYDRATED DERMIS);  Surgeon: Wayland Denis, DO;  Location: [facility] OR;  Service: Plastics;  Laterality: Bilateral;    There were no vitals filed for this visit.  Visit Diagnosis:  Stiffness of shoulder joint, right - Plan: PT plan of care cert/re-cert  Stiffness of shoulder joint, left - Plan: PT plan of care cert/re-cert  At risk for lymphedema - Plan: PT plan of care cert/re-cert      Subjective Assessment - 12/08/15 0802    Subjective I got an appointment to see the orthopedic surgeon on the 17th about the shoulder.  I got the compression sleeve--it's too small.  Had to wait three weeks for it.  They sent me the wrong size.  They're supposed to send me a return label.  Got a fill last week of 40 ccs.  Will probably have implants placed in June or July.   Currently in Pain? Yes   Pain Score 5    Pain Location Arm   Pain Orientation Right   Pain Descriptors / Indicators Other (Comment)  stiffness   Aggravating Factors  not sure--feels like something is holding it back   Pain Relieving Factors exercise            [facility] PT Assessment - 12/08/15 0001    AROM   Right Shoulder Flexion 114 Degrees   Right Shoulder ABduction 105 Degrees   Left Shoulder Flexion 111 Degrees   Left Shoulder ABduction 85 Degrees                     [facility] Adult PT Treatment/Exercise - 12/08/15 0001    Shoulder Exercises: Pulleys   Flexion 3 minutes   ABduction 3 minutes   Manual Therapy   Scapular Mobilization To each side in sidelying, to tolerance   Passive ROM  In supine to each shoulder in ER, flexion, and abduction to tolerance; used distraction to assist with increasing ROM                   Short Term Clinic Goals - 10/05/15 1546    CC Short Term Goal  #1   Title Patient with verbalize an understanding of lymphedema risk reduction precautions   Status Achieved   CC Short Term Goal  #2   Title Patient will improve right  and left shoulder abduction to 110 degrees so that she can reach the top of her head for dressing and grooming   Time 4   Period Weeks   Status Revised             Long Term Clinic Goals - 12/08/15 1229    CC Long Term Goal  #1   Title Patient will report a decrease in pain by 50% so they can perform daily activities with greater ease   Status Achieved   CC Long Term Goal  #2   Title Patient will be independent in a home exercise program   Status Partially Met   CC Long Term Goal  #3   Title Patient will be know how to obtain and use compression garments for maintenance phase of treatment   Status Achieved   CC Long Term Goal  #4   Title Patient will decrease the DASH score to < 20   to demonstrate increased functional use of upper extremity   Status On-going   CC Long Term Goal  #5   Title Patient will have a circumferential reduction of   1      cm at    10      cm above the             olecranon process   Status Achieved   CC Long Term Goal  #6   Title Pt will have right and left shoulder flexion range of motion to 125 degrees so that she can reach into cabinets in her kitchen    Status On-going            Plan - 12/08/15 1223    Clinical Impression Statement Patient seemed to tolerate manual stretches better today than she has in the past.  Her ROM improved slightly in 3 categories, but significantly in right shoulder active abduction, from 81 to 105 degrees compared to last visit a month ago.  She continues to be able to come to therapy only once every 2-4 weeks due to restrictions on her  being able to leave work just once a pay period for medical appointments.  She has had difficulty getting her compression sleeve, in part due to insurance restrictions, and is still  waiting for that to be resolved (she was sent the wrong size, and delivery time was slow).   Pt will benefit from skilled therapeutic intervention in order to improve on the following deficits Decreased range of motion;Pain;Impaired UE functional use;Increased muscle spasms;Increased fascial restricitons;Decreased scar mobility;Decreased strength;Increased edema   Rehab Potential Good   PT Frequency Other (comment)  once every two weeks due to work constraints   PT Duration 12 weeks   PT Treatment/Interventions Therapeutic exercise;Manual techniques;Passive range of motion   PT Next Visit Plan Continue focus on stretching and focus on manual techniques that she cannot do for herself.  Check on compression sleeve status.   PT Home Exercise Plan Self manual lymph drainage, ROM.  Focus on stretching before strengthening.   Consulted and Agree with Plan of Care Patient        Problem List Patient Active Problem List   Diagnosis Date Noted  . Encounter for chemotherapy management 08/18/2015  . Arm DVT (deep venous thromboembolism), acute ([place]) 12/03/2014  . Antineoplastic chemotherapy induced pancytopenia ([place]) 11/30/2014  . Pulmonary embolism ([place]) 11/29/2014  . Bronchitis 11/25/2014  . Tachycardia 11/25/2014  . Genetic testing 10/11/2014  . Monoallelic mutation of PALB2 gene 10/11/2014  . Constipation 10/05/2014  . Breast cancer of upper-outer quadrant of right female breast ([place]) 08/10/2014    [place] 12/08/2015, 12:33 PM  [place], [place], [address] Phone: [phone]   Fax:  [phone]  Name: Jamie Edwards MRN: [id number] Date of Birth: 04-Jun-1971    Serafina Royals, PT 12/08/2015 12:33 PM

## 2015-12-15 ENCOUNTER — Ambulatory Visit: Payer: Commercial Managed Care - HMO | Admitting: Physical Therapy

## 2015-12-22 ENCOUNTER — Ambulatory Visit: Payer: Commercial Managed Care - HMO | Admitting: Physical Therapy

## 2015-12-22 DIAGNOSIS — Z9189 Other specified personal risk factors, not elsewhere classified: Secondary | ICD-10-CM

## 2015-12-22 DIAGNOSIS — M25611 Stiffness of right shoulder, not elsewhere classified: Secondary | ICD-10-CM | POA: Diagnosis not present

## 2015-12-22 DIAGNOSIS — M25612 Stiffness of left shoulder, not elsewhere classified: Secondary | ICD-10-CM

## 2015-12-22 NOTE — Therapy (Signed)
[place], [place], [address] Phone: [phone]   Fax:  [phone]  Physical Therapy Treatment  Patient Details  Name: Jamie Edwards MRN: [id number] Date of Birth: 1971-03-07 No Data Recorded  Encounter Date: 12/22/2015      PT End of Session - 12/22/15 1246    Visit Number 35   Number of Visits 49   Date for PT Re-Evaluation 03/07/16   PT Start Time 0805   PT Stop Time 0849   PT Time Calculation (min) 44 min   Activity Tolerance Patient tolerated treatment well   Behavior During Therapy [facility] for tasks assessed/performed      Past Medical History  Diagnosis Date  . Increased heart rate 11/2014    pt. put on metoprolol  . Pulmonary embolism ([place]) 11/2014  . DVT (deep venous thrombosis) ([place]) 11/2014    BUE  . Cancer of right breast ([place]) 08/2014    S/P chemo 09/2014-02/02/2015  . Heart murmur     pt. states a doctor stated she had a slight murmur, no studies done. Present PCP has never mention it to her.    Past Surgical History  Procedure Laterality Date  . Mouth surgery      implants  . Portacath placement Right 08/31/2014    Procedure: INSERTION PORT-A-CATH WITH ULTRA SOUND GUIDENCE;  Surgeon: Erroll Luna, MD;  Location: [place];  Service: General;  Laterality: Right;  . Mastectomy complete / simple w/ sentinel node biopsy Right 04/12/2015  . Mastectomy complete / simple Left 04/12/2015    PROPHYLATIC  . Breast reconstruction with placement of tissue expander and flex hd (acellular hydrated dermis) Bilateral 04/12/2015  . Port-a-cath removal  04/12/2015  . Breast biopsy Right 07/2014  . Simple mastectomy with axillary sentinel node biopsy Bilateral 04/12/2015    Procedure: BILATERAL TOTAL MASTECTOMY WITH RIGHT AXILLARY SENTINEL LYMPH  NODE BIOPSY, POSSIBLE AXILLARY DISSECTION, LEFT PROPHYLATIC;  Surgeon: Excell Seltzer, MD;  Location: [place];  Service: General;   Laterality: Bilateral;  . Port-a-cath removal Right 04/12/2015    Procedure: REMOVAL PORT-A-CATH;  Surgeon: Excell Seltzer, MD;  Location: [facility];  Service: General;  Laterality: Right;  . Breast reconstruction with placement of tissue expander and flex hd (acellular hydrated dermis) Bilateral 04/12/2015    Procedure: IMMEDIATE BILATERAL BREAST RECONSTRUCTION WITH PLACEMENT OF TISSUE EXPANDER AND FLEX HD (ACELLULAR HYDRATED DERMIS);  Surgeon: Theodoro Kos, DO;  Location: [facility];  Service: Plastics;  Laterality: Bilateral;    There were no vitals filed for this visit.  Visit Diagnosis:  Stiffness of shoulder joint, right  Stiffness of shoulder joint, left  At risk for lymphedema      Subjective Assessment - 12/22/15 0806    Subjective I have an appointment today with the orthopedic surgeon.  Sent the sleeve back because it didn't fit, but hasn't got the replacement back yet.  Got the gauntlet but hasn't tried it on yet.   Currently in Pain? Yes   Pain Score 2    Pain Location Axilla   Pain Orientation Right   Pain Descriptors / Indicators --  stiffness            [facility] PT Assessment - 12/22/15 0001    AROM   Right Shoulder Flexion 122 Degrees   Right Shoulder ABduction 103 Degrees   Left Shoulder Flexion 115 Degrees   Left Shoulder ABduction 90 Degrees  [facility] Adult PT Treatment/Exercise - 12/22/15 0001    Manual Therapy   Manual Lymphatic Drainage (MLD) In supine, diaphragmatic breathing, short neck, left axilla and anterior interaxillary anastomosis, right groin and axillo-inguinal anastomosis, and right UE from fingers to shoulder.  Reviewed instructions with patient to cement them for her.   Passive ROM In supine to each shoulder in ER, flexion, and abduction to tolerance; used distraction to assist with increasing ROM                PT Education - 12/22/15 1245    Education provided Yes   Education Details reviewed self-manual  lymph drainage while performing it; discussed recommended frequency of daily, but every other day still beneficial   Person(s) Educated Patient   Methods Explanation;Demonstration;Tactile cues;Verbal cues   Comprehension Verbalized understanding           Short Term Clinic Goals - 10/05/15 1546    CC Short Term Goal  #1   Title Patient with verbalize an understanding of lymphedema risk reduction precautions   Status Achieved   CC Short Term Goal  #2   Title Patient will improve right  and left shoulder abduction to 110 degrees so that she can reach the top of her head for dressing and grooming   Time 4   Period Weeks   Status Revised             Long Term Clinic Goals - 12/22/15 1249    CC Long Term Goal  #2   Title Patient will be independent in a home exercise program   Status Partially Met   CC Long Term Goal  #4   Title Patient will decrease the DASH score to < 20   to demonstrate increased functional use of upper extremity   Status On-going   CC Long Term Goal  #6   Title Pt will have right and left shoulder flexion range of motion to 125 degrees so that she can reach into cabinets in her kitchen    Status On-going            Plan - 12/22/15 1246    Clinical Impression Statement Patient shoulder AROM increased slightly.  She seemed to tolerate stretching a little better, but remains significantly limited.  Patient is to see an orthopedist today for assessment of shoulders.  She has not received her compression sleeve yet; has the right size gauntlet, but hasn't tried it.   Pt will benefit from skilled therapeutic intervention in order to improve on the following deficits Decreased range of motion;Pain;Impaired UE functional use;Increased muscle spasms;Increased fascial restricitons;Decreased scar mobility;Decreased strength;Increased edema   Rehab Potential Good   Clinical Impairments Affecting Rehab Potential PE last year with UE swelling, finished with radiation  08/14/15. Expanders in place and may have exchange surgery in July.   PT Frequency --  once every two weeks due to work constraints   PT Duration 12 weeks   PT Treatment/Interventions Passive range of motion;Manual lymph drainage;Manual techniques   PT Next Visit Plan Continue focus on stretching and focus on manual techniques that she cannot do for herself.  Check on compression sleeve status.   PT Home Exercise Plan Self manual lymph drainage, ROM.  Focus on stretching before strengthening.   Consulted and Agree with Plan of Care Patient        Problem List Patient Active Problem List   Diagnosis Date Noted  . Encounter for chemotherapy management 08/18/2015  . Arm DVT (deep  venous thromboembolism), acute ([place]) 12/03/2014  . Antineoplastic chemotherapy induced pancytopenia ([place]) 11/30/2014  . Pulmonary embolism ([place]) 11/29/2014  . Bronchitis 11/25/2014  . Tachycardia 11/25/2014  . Genetic testing 10/11/2014  . Monoallelic mutation of PALB2 gene 10/11/2014  . Constipation 10/05/2014  . Breast cancer of upper-outer quadrant of right female breast ([place]) 08/10/2014    [place] 12/22/2015, 12:51 PM  [place], [place], [address] Phone: [phone]   Fax:  [phone]  Name: Jamie Edwards MRN: [id number] Date of Birth: Feb 15, 1971    Serafina Royals, PT 12/22/2015 12:51 PM

## 2015-12-26 ENCOUNTER — Other Ambulatory Visit: Payer: Self-pay

## 2015-12-26 DIAGNOSIS — C50411 Malignant neoplasm of upper-outer quadrant of right female breast: Secondary | ICD-10-CM

## 2015-12-26 MED ORDER — CAPECITABINE 500 MG PO TABS: 1000.0000 mg/m2 | ORAL_TABLET | Freq: Two times a day (BID) | ORAL | Status: DC

## 2016-01-04 ENCOUNTER — Telehealth: Payer: Self-pay | Admitting: Pharmacist

## 2016-01-04 NOTE — Telephone Encounter (Signed)
Left VM asking patient to call for Xeloda follow up.

## 2016-01-05 ENCOUNTER — Telehealth: Payer: Self-pay | Admitting: Pharmacist

## 2016-01-05 ENCOUNTER — Encounter: Payer: Commercial Managed Care - HMO | Admitting: Physical Therapy

## 2016-01-05 DIAGNOSIS — K30 Functional dyspepsia: Secondary | ICD-10-CM

## 2016-01-05 NOTE — Telephone Encounter (Signed)
Oral Chemotherapy Follow-Up Form  Original Start date of oral chemotherapy: 09/06/16  Follow up regarding patient's oral chemotherapy medication: Xeloda  Pt reports 0 tablets/doses missed in the last month.   Pt reports the following side effects: LUQ discomfort:  Describes as a pressure, not painful.  Duration: (new sx) 2 weeks; constant.  If this worsens, she will call Dr. Geralyn Flash RN or go to ED. Worsened indigestion & flatulence: Constant when "on" Xeloda.  Uses Alka-Seltzer for heartburn relief every other day.  Takes 1-2 tabs mainly in the evenings approx 1-2 hrs after her evening Xeloda dose.  The indigestion occurs with anything she eats.  She will try using Pepcid 10 mg PO BID.   Her appetite is great. Nausea: occurs when she restarts Xeloda.  She uses antiemetics infrequently. HA: L side forehead.  APAP helps relieve intermittent pain.  This is not new.  Occurs during her "on" weeks. Low-grade tingling in feet: she attributes this to previous chemo.  This is not worse w/ Xeloda. Cough: throat dry.  Worse at night.  She may try nasal saline spray/humidifier. SOB w/ exertion: notices w/ climbing stairs.  Last CBC wnl.    Denies diarrhea/mouth sores/pain in hands/feet.  Will follow up and call patient again in 1 month (after her appt w/ Dr. Lindi Adie on 01/26/16)  Thank you,  Kennith Center, Pharm.D., CPP 01/05/2016@9 :24 AM Oral Chemotherapy Clinic

## 2016-01-08 ENCOUNTER — Ambulatory Visit: Payer: Commercial Managed Care - HMO | Attending: Radiation Oncology | Admitting: Physical Therapy

## 2016-01-08 DIAGNOSIS — M25512 Pain in left shoulder: Secondary | ICD-10-CM | POA: Insufficient documentation

## 2016-01-08 DIAGNOSIS — M25611 Stiffness of right shoulder, not elsewhere classified: Secondary | ICD-10-CM | POA: Insufficient documentation

## 2016-01-08 DIAGNOSIS — M25612 Stiffness of left shoulder, not elsewhere classified: Secondary | ICD-10-CM | POA: Diagnosis present

## 2016-01-08 DIAGNOSIS — M25511 Pain in right shoulder: Secondary | ICD-10-CM | POA: Diagnosis present

## 2016-01-08 NOTE — Therapy (Signed)
[place], [place], [address] Phone: [phone]   Fax:  [phone]  Physical Therapy Treatment  Patient Details  Name: Jamie Edwards MRN: [id number] Date of Birth: [birth year] No Data Recorded  Encounter Date: 01/08/2016      PT End of Session - 01/08/16 1658    Visit Number 36   Number of Visits 49   Date for PT Re-Evaluation 03/07/16   PT Start Time 0806   PT Stop Time 0849   PT Time Calculation (min) 43 min   Activity Tolerance Patient tolerated treatment well;Patient limited by pain   Behavior During Therapy [facility] for tasks assessed/performed      Past Medical History  Diagnosis Date  . Increased heart rate 11/2014    pt. put on metoprolol  . Pulmonary embolism ([place]) 11/2014  . DVT (deep venous thrombosis) ([place]) 11/2014    BUE  . Cancer of right breast ([place]) 08/2014    S/P chemo 09/2014-02/02/2015  . Heart murmur     pt. states a doctor stated she had a slight murmur, no studies done. Present PCP has never mention it to her.    Past Surgical History  Procedure Laterality Date  . Mouth surgery      implants  . Portacath placement Right 08/31/2014    Procedure: INSERTION PORT-A-CATH WITH ULTRA SOUND GUIDENCE;  Surgeon: Erroll Luna, MD;  Location: [place];  Service: General;  Laterality: Right;  . Mastectomy complete / simple w/ sentinel node biopsy Right 04/12/2015  . Mastectomy complete / simple Left 04/12/2015    PROPHYLATIC  . Breast reconstruction with placement of tissue expander and flex hd (acellular hydrated dermis) Bilateral 04/12/2015  . Port-a-cath removal  04/12/2015  . Breast biopsy Right 07/2014  . Simple mastectomy with axillary sentinel node biopsy Bilateral 04/12/2015    Procedure: BILATERAL TOTAL MASTECTOMY WITH RIGHT AXILLARY SENTINEL LYMPH  NODE BIOPSY, POSSIBLE AXILLARY DISSECTION, LEFT PROPHYLATIC;  Surgeon: Excell Seltzer, MD;  Location: [facility];   Service: General;  Laterality: Bilateral;  . Port-a-cath removal Right 04/12/2015    Procedure: REMOVAL PORT-A-CATH;  Surgeon: Excell Seltzer, MD;  Location: [place];  Service: General;  Laterality: Right;  . Breast reconstruction with placement of tissue expander and flex hd (acellular hydrated dermis) Bilateral 04/12/2015    Procedure: IMMEDIATE BILATERAL BREAST RECONSTRUCTION WITH PLACEMENT OF TISSUE EXPANDER AND FLEX HD (ACELLULAR HYDRATED DERMIS);  Surgeon: Theodoro Kos, DO;  Location: [facility];  Service: Plastics;  Laterality: Bilateral;    There were no vitals filed for this visit.  Visit Diagnosis:  Stiffness of right shoulder, not elsewhere classified  Stiffness of left shoulder, not elsewhere classified  Pain in right shoulder  Pain in left shoulder      Subjective Assessment - 01/08/16 0809    Subjective Saw Dr. Percell Miller, orthopedist, and he did a steroid injection in her upper back; she can't tell much difference.  Saw the plastic surgeon too, and she didn't do a fill on that same day.  Feels like there is a band from the expander that is liimiting how far she can go.  Sleeve is still on backorder.             [facility] PT Assessment - 01/08/16 0001    Observation/Other Assessments   Quick DASH  27.27   AROM   Right Shoulder Flexion 130 Degrees  after stretching   Right Shoulder ABduction 103 Degrees   Left Shoulder Flexion 124  Degrees   Left Shoulder ABduction 95 Degrees              Quick Dash - 01/08/16 0001    Open a tight or new jar Moderate difficulty   Do heavy household chores (wash walls, wash floors) Severe difficulty   Carry a shopping bag or briefcase No difficulty   Wash your back Mild difficulty   Use a knife to cut food No difficulty   Recreational activities in which you take some force or impact through your arm, shoulder, or hand (golf, hammering, tennis) Mild difficulty   During the past week, to what extent has your arm, shoulder or hand  problem interfered with your normal social activities with family, friends, neighbors, or groups? Slightly   During the past week, to what extent has your arm, shoulder or hand problem limited your work or other regular daily activities Slightly   Arm, shoulder, or hand pain. Mild   Tingling (pins and needles) in your arm, shoulder, or hand Mild   Difficulty Sleeping Mild difficulty   DASH Score 27.27 %               [facility] Adult PT Treatment/Exercise - 01/08/16 0001    Manual Therapy   Myofascial Release Rt. UE myofascial pulling with movement into abduction.   Passive ROM In supine to each shoulder in ER, flexion, and abduction to tolerance; used distraction to assist with increasing ROM                   Short Term Clinic Goals - 10/05/15 1546    CC Short Term Goal  #1   Title Patient with verbalize an understanding of lymphedema risk reduction precautions   Status Achieved   CC Short Term Goal  #2   Title Patient will improve right  and left shoulder abduction to 110 degrees so that she can reach the top of her head for dressing and grooming   Time 4   Period Weeks   Status Revised             Long Term Clinic Goals - 01/08/16 1704    CC Long Term Goal  #2   Title Patient will be independent in a home exercise program   Status Partially Met   CC Long Term Goal  #3   Title Patient will be know how to obtain and use compression garments for maintenance phase of treatment   Status Achieved   CC Long Term Goal  #4   Title Patient will decrease the DASH score to < 20   to demonstrate increased functional use of upper extremity   Baseline baseline 56.82, 29.55 on 10/04/2015; 27.27 on 01/08/16   Status Partially Met   CC Long Term Goal  #5   Title Patient will have a circumferential reduction of   1      cm at    10      cm above the             olecranon process   CC Long Term Goal  #6   Title Pt will have right and left shoulder flexion range of motion to  125 degrees so that she can reach into cabinets in her kitchen    Baseline Almost met on 01/08/16:  right at 130, left at 124 degrees   Status Partially Met            Plan - 01/08/16 1658    Clinical  Impression Statement Patient's active shoulder flexion increased 8 and 9 degrees respectively on right and left sides compared to two weeks ago; left shoulder active abduction increased 5 degrees.  These are nice gains for this patient.  She saw an orthopedist recently and had an injection which she locates in her right upper back (trap), and which didn't make a significant improvement for her.  She is still waiting for her mail order compression sleeve from insurance company's vendor in another [place].     Pt will benefit from skilled therapeutic intervention in order to improve on the following deficits Decreased range of motion;Pain;Impaired UE functional use;Increased muscle spasms;Increased fascial restricitons;Decreased scar mobility;Decreased strength;Increased edema   Rehab Potential Good   Clinical Impairments Affecting Rehab Potential PE last year with UE swelling, finished with radiation 08/14/15. Expanders in place and may have exchange surgery in July.   PT Frequency --  once every two weeks due to work constraints   PT Duration 12 weeks   PT Treatment/Interventions Passive range of motion;Manual techniques   PT Next Visit Plan Continue focus on stretching and focus on manual techniques that she cannot do for herself.  Check on compression sleeve status.  Consider decreasing to once a month follow-up until after her reconstruction surgery in June or July.   PT Home Exercise Plan Self manual lymph drainage, ROM.  Focus on stretching before strengthening.   Consulted and Agree with Plan of Care Patient        Problem List Patient Active Problem List   Diagnosis Date Noted  . Encounter for chemotherapy management 08/18/2015  . Arm DVT (deep venous thromboembolism), acute ([place])  12/03/2014  . Antineoplastic chemotherapy induced pancytopenia ([place]) 11/30/2014  . Pulmonary embolism ([place]) 11/29/2014  . Bronchitis 11/25/2014  . Tachycardia 11/25/2014  . Genetic testing 10/11/2014  . Monoallelic mutation of PALB2 gene 10/11/2014  . Constipation 10/05/2014  . Breast cancer of upper-outer quadrant of right female breast ([place]) 08/10/2014    Opie Fanton 01/08/2016, 5:08 PM  [place], [place], [address] Phone: [phone]   Fax:  [phone]  Name: Jamie Edwards MRN: [id number] Date of Birth: 1970/10/15    Serafina Royals, PT 01/08/2016 5:08 PM

## 2016-01-11 ENCOUNTER — Telehealth: Payer: Self-pay

## 2016-01-11 NOTE — Telephone Encounter (Signed)
Returned pt call re: headache.  Pt reports headache for the past 2-3 days,  5/10, starts back of her head and moves to front, head feels "swimmy".  Pt reports she is taking Tylenol which provides 2-3 hours of relief and she is able to sleep through the night.  Pt denies any problems with balance, numbness, dizziness, motor difficulties or weakness with the limbs, difficulty seeing, difficulty with comprehension or speaking, or facial drooping.  Patient denies any GI problems or temp.  Pt reports this headache is similar to the headache she would get whenever she started a new cycle of Xeloda.    Writer observed pt is coughing.  Pt reports she does have problems with seasonal allergies and had tried benadryl with no relief.    I advised pt to continue to take Tylenol, to add an allergy medication such as claritin, allegra, zyrtec, flonase, or nasonex and to ensure she does not exceed 4000 mg Tylenol in 24 hours.  I advised pt to go to ED if she experiences any signs of stroke.  I advised pt to call clinic if symptoms worsen or do not resolve.  Pt voiced understanding.

## 2016-01-11 NOTE — Telephone Encounter (Signed)
Patient called c/o " a couple of days of a severe headache, mainly right sided".  Patient is requesting a call back from nurse.

## 2016-01-15 ENCOUNTER — Telehealth: Payer: Self-pay | Admitting: *Deleted

## 2016-01-15 NOTE — Telephone Encounter (Signed)
Patient called c/o severe H/A for 3-5 days which comes and goes with Tylenol. Wanted to come in and talk with Dr. Lindi Adie. Returned call and left VMM that Dr. Lindi Adie is out of the office all week. Patient does have appt on 4/21. Advised to call if she wanted to have appt moved up.

## 2016-01-16 ENCOUNTER — Telehealth: Payer: Self-pay

## 2016-01-16 NOTE — Telephone Encounter (Signed)
Pt of Dr Lindi Adie is c/o headache. Pt is taking xeloda 2 weeks on 1 week off. Started back taking it this Sunday 9th. She is c/o headache for the last 4-5 days she cannot manage nor get rid of. The HA will move around, predominantly frontal. But also the sides or back of head. Using 650 mg tylenol aleveates it but it comes back in about 4-6 hours. Has not tried ibuprofen.  Feels like under water. Claritin and flonase don't really effect it. She does have nasal drainage and mild cough. Her bp was 135/91 pulse 104 at [facility]. No visual changes, no ringing in ears,. Dizzy if gets up too fast. No accidents, no falls. No hx of migraines.

## 2016-01-16 NOTE — Telephone Encounter (Signed)
I reviewed pt's chart and called her back. She had intermittent headaches in the past, he started again for 5 days ago, when she was off Xeloda. It is mild to moderate and persistent. Tylenol helps, she has not been sleeping well for the past 4-5 days. She does have some postnasal drainage, and a mild cough. No fever or chills, no vision change, or other neurological symptoms.  I recommend her to try Tylenol PM, and use ibuprofen alternatively. She does not feel she needs pain prescription at this point. She takes Claritin during day, and he knows to take Benadryl if needed at night. I encouraged her to call us if the pain does not resolve by early next week, or if she develops other neurological symptoms. She has appointment with Dr. Lindi Adie in a few weeks.  Truitt Merle  01/16/2016

## 2016-01-19 ENCOUNTER — Emergency Department (HOSPITAL_COMMUNITY): Payer: Commercial Managed Care - HMO

## 2016-01-19 ENCOUNTER — Emergency Department (HOSPITAL_COMMUNITY)
Admission: EM | Admit: 2016-01-19 | Discharge: 2016-01-20 | Disposition: A | Discharge status: Home / Self Care | Payer: Commercial Managed Care - HMO | Attending: Emergency Medicine | Admitting: Emergency Medicine

## 2016-01-19 ENCOUNTER — Encounter (HOSPITAL_COMMUNITY): Payer: Self-pay | Admitting: Emergency Medicine

## 2016-01-19 DIAGNOSIS — C7931 Secondary malignant neoplasm of brain: Secondary | ICD-10-CM | POA: Diagnosis not present

## 2016-01-19 DIAGNOSIS — Z86718 Personal history of other venous thrombosis and embolism: Secondary | ICD-10-CM | POA: Diagnosis not present

## 2016-01-19 DIAGNOSIS — R011 Cardiac murmur, unspecified: Secondary | ICD-10-CM | POA: Insufficient documentation

## 2016-01-19 DIAGNOSIS — C50411 Malignant neoplasm of upper-outer quadrant of right female breast: Secondary | ICD-10-CM

## 2016-01-19 DIAGNOSIS — R51 Headache: Secondary | ICD-10-CM

## 2016-01-19 DIAGNOSIS — Z7951 Long term (current) use of inhaled steroids: Secondary | ICD-10-CM | POA: Insufficient documentation

## 2016-01-19 DIAGNOSIS — Z923 Personal history of irradiation: Secondary | ICD-10-CM | POA: Insufficient documentation

## 2016-01-19 DIAGNOSIS — Z86711 Personal history of pulmonary embolism: Secondary | ICD-10-CM | POA: Insufficient documentation

## 2016-01-19 DIAGNOSIS — R000 Tachycardia, unspecified: Secondary | ICD-10-CM | POA: Diagnosis not present

## 2016-01-19 DIAGNOSIS — Z853 Personal history of malignant neoplasm of breast: Secondary | ICD-10-CM | POA: Insufficient documentation

## 2016-01-19 DIAGNOSIS — Z9221 Personal history of antineoplastic chemotherapy: Secondary | ICD-10-CM | POA: Diagnosis not present

## 2016-01-19 DIAGNOSIS — R519 Headache, unspecified: Secondary | ICD-10-CM

## 2016-01-19 DIAGNOSIS — R05 Cough: Secondary | ICD-10-CM | POA: Insufficient documentation

## 2016-01-19 MED ORDER — SODIUM CHLORIDE 0.9 % IV BOLUS (SEPSIS)
1000.0000 mL | Freq: Once | INTRAVENOUS | Status: AC
  Administered 2016-01-20: 1000 mL via INTRAVENOUS

## 2016-01-19 MED ORDER — DIPHENHYDRAMINE HCL 50 MG/ML IJ SOLN
25.0000 mg | Freq: Once | INTRAMUSCULAR | Status: AC
  Administered 2016-01-20: 25 mg via INTRAVENOUS
  Filled 2016-01-19: qty 1

## 2016-01-19 MED ORDER — METOCLOPRAMIDE HCL 5 MG/ML IJ SOLN
10.0000 mg | Freq: Once | INTRAMUSCULAR | Status: AC
  Administered 2016-01-20: 10 mg via INTRAVENOUS
  Filled 2016-01-19: qty 2

## 2016-01-19 NOTE — ED Notes (Signed)
Pt c/o HA x 9 days, constant. Pt states this HA different than normal HA. Intermittent nausea. Pt states she has tried Benadryl and other OTC meds. Per family pt has been "off balance"

## 2016-01-19 NOTE — ED Provider Notes (Signed)
CSN: [id number]     Arrival date & time 01/19/16  2240 History   By signing my name below, I, Jamie Edwards, attest that this documentation has been prepared under the direction and in the presence of [employer].   Electronically Signed: Nicole Edwards, ED Scribe 01/19/2016 at 2:20 AM.    Chief Complaint  Patient presents with  . Headache    The history is provided by the patient and medical records. No language interpreter was used.   HPI Comments: Jamie Edwards is a 45 y.o. 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 She states the headache moves from the right to the left side. The pain keeps her up at night and is worst at night and when she wakes up. She reports associated intermittent nausea, "room spinning" dizziness, and cough ongoing for one week. Pt's family reports she has been "off balance".but has not been falling. She says this headache is not like any of her previous episodes. No other associated symptoms noted. Pt has taken benadryl and Claritin with no relief to symptoms. Coughing exacerbates her headache. Tylenol provides temporary relief. No other worsening or alleviating factors noted. Pt denies visual disturbances, slurred speech, focal changes, hx of migraines, ear pain, fevers, chills, neck pain, photophobia, or any other pertinent symptoms. Pt is currently undergoing chemotherapy (oral chemo). Pt is not currently taking anticoagulants. She was off xeloda when her symptoms began. She began xeloda on 01/14/2016. She takes the medication in two week cycles (two weeks on/two weeks off).   Past Medical History  Diagnosis Date  . Increased heart rate 11/2014    pt. put on metoprolol  . Pulmonary embolism ([place])  11/2014  . DVT (deep venous thrombosis) ([place]) 11/2014    BUE  . Cancer of right breast ([place]) 08/2014    S/P chemo 09/2014-02/02/2015  . Heart murmur     pt. states a doctor stated she had a slight murmur, no studies done. Present PCP has never mention it to her.   Past Surgical History  Procedure Laterality Date  . Mouth surgery      implants  . Portacath placement Right 08/31/2014    Procedure: INSERTION PORT-A-CATH WITH ULTRA SOUND GUIDENCE;  Surgeon: Erroll Luna, MD;  Location: [place];  Service: General;  Laterality: Right;  . Mastectomy complete / simple w/ sentinel node biopsy Right 04/12/2015  . Mastectomy complete / simple Left 04/12/2015    PROPHYLATIC  . Breast reconstruction with placement of tissue expander and flex hd (acellular hydrated dermis) Bilateral 04/12/2015  . Port-a-cath removal  04/12/2015  . Breast biopsy Right 07/2014  . Simple mastectomy with axillary sentinel node biopsy Bilateral 04/12/2015    Procedure: BILATERAL TOTAL MASTECTOMY WITH RIGHT AXILLARY SENTINEL LYMPH  NODE BIOPSY, POSSIBLE AXILLARY DISSECTION, LEFT PROPHYLATIC;  Surgeon: Excell Seltzer, MD;  Location: [place];  Service: General;  Laterality: Bilateral;  . Port-a-cath removal Right 04/12/2015    Procedure: REMOVAL PORT-A-CATH;  Surgeon: Excell Seltzer, MD;  Location: [facility];  Service: General;  Laterality: Right;  . Breast reconstruction with placement of tissue expander and flex hd (acellular hydrated dermis) Bilateral 04/12/2015    Procedure: IMMEDIATE BILATERAL BREAST RECONSTRUCTION WITH PLACEMENT OF TISSUE EXPANDER AND FLEX HD (ACELLULAR HYDRATED DERMIS);  Surgeon: Theodoro Kos, DO;  Location:  [facility];  Service: Plastics;  Laterality: Bilateral;   Family History  Problem Relation Age of Onset  . Breast cancer Mother 68  . Prostate cancer Father 38  . Esophageal cancer Paternal Uncle     smoker  . Cancer Cousin    Social History  Substance Use Topics  . Smoking status: Never  Smoker   . Smokeless tobacco: Never Used  . Alcohol Use: No   OB History    No data available     Review of Systems  Constitutional: Negative for fever, chills, diaphoresis, appetite change, fatigue and unexpected weight change.  HENT: Negative for mouth sores.   Eyes: Negative for visual disturbance.  Respiratory: Positive for cough. Negative for chest tightness, shortness of breath and wheezing.   Cardiovascular: Negative for chest pain.  Gastrointestinal: Positive for nausea. Negative for vomiting, abdominal pain, diarrhea and constipation.  Endocrine: Negative for polydipsia, polyphagia and polyuria.  Genitourinary: Negative for dysuria, urgency, frequency and hematuria.  Musculoskeletal: Negative for back pain, neck pain and neck stiffness.  Skin: Negative for rash.  Allergic/Immunologic: Negative for immunocompromised state.  Neurological: Positive for headaches. Negative for syncope and light-headedness.  Hematological: Does not bruise/bleed easily.  Psychiatric/Behavioral: Negative for sleep disturbance. The patient is not nervous/anxious.   All other systems reviewed and are negative.   Allergies  Erythromycin base; Penicillins; and Zithromax  Home Medications   Prior to Admission medications   Medication Sig Start Date End Date Taking? Authorizing Provider  acetaminophen (TYLENOL) 325 MG tablet Take 650 mg by mouth every 6 (six) hours as needed for moderate pain.   Yes Historical Provider, MD  capecitabine (XELODA) 500 MG tablet Take 4 tablets (2,000 mg total) by mouth 2 (two) times daily after a meal. Take for 14 days then off for 1 week Every 21 days 12/26/15  Yes Nicholas Lose, MD  diphenhydrAMINE (BENADRYL) 25 mg capsule Take 25 mg by mouth every 6 (six) hours as needed for allergies.   Yes Historical Provider, MD  fluticasone (FLONASE) 50 MCG/ACT nasal spray Place 1 spray into both nostrils daily as needed for allergies or rhinitis.   Yes Historical Provider, MD   loratadine (CLARITIN) 10 MG tablet Take 10 mg by mouth daily as needed for allergies.   Yes Historical Provider, MD  Sodium Chloride-Sodium Bicarb (NETI POT SINUS [place]) 2300-700 MG KIT Place 1 application into the nose daily as needed. Congestion   Yes Historical Provider, MD  dexamethasone (DECADRON) 4 MG tablet Take 1 tablet (4 mg total) by mouth 4 (four) times daily. 1 tab QID x4 days, 1 tab TID x2 days, 1 tab BID x2 days, 1 tab QD x2 days, 0.5 tab QD x 2 days 01/20/16   Jarrett Soho Harly Pipkins, PA-C  omeprazole (PRILOSEC) 20 MG capsule Take 1 capsule (20 mg total) by mouth daily. 01/20/16   Chaden Doom, PA-C  UNABLE TO FIND 1 each by Other route as needed. Dispense Class 1 preventative compression sleeve 20-37m/Hg for this patient with a hx of breast CA s/p surgery 11/03/15   VNicholas Lose MD  ZGeising[facility] Medical CenterODT 8 MG disintegrating tablet 881mODT q4 hours prn nausea 01/20/16   Hosanna Betley, PA-C   BP 134/78 mmHg  Pulse 106  Temp(Src) 98.2 F (36.8 C) (Oral)  Resp 20  SpO2 97% Physical Exam  Constitutional: She is oriented to person, place, and time. She appears well-developed and well-nourished. No distress.  HENT:  Head: Normocephalic and atraumatic.  Mouth/Throat: Oropharynx is clear and moist.  Eyes: Conjunctivae and EOM are normal. Pupils are equal, round, and reactive to light. No scleral icterus.  No horizontal, vertical or rotational nystagmus  Neck: Normal range of motion. Neck supple.  Full active and passive ROM without pain No midline or paraspinal tenderness No nuchal rigidity or meningeal signs  Cardiovascular: Regular rhythm, normal heart sounds and intact distal pulses.  Tachycardia present.   Pulses:      Radial pulses are 2+ on the right side, and 2+ on the left side.  Pulmonary/Chest: Effort normal and breath sounds normal. No respiratory distress. She has no wheezes. She has no rales.  Abdominal: Soft. Bowel sounds are normal. There is no tenderness. There is no  rebound and no guarding.  Musculoskeletal: Normal range of motion.  Lymphadenopathy:    She has no cervical adenopathy.  Neurological: She is alert and oriented to person, place, and time. She has normal reflexes. No cranial nerve deficit. She exhibits normal muscle tone. Coordination normal.  Mental Status:  Alert, oriented, thought content appropriate. Speech fluent without evidence of aphasia. Able to follow 2 step commands without difficulty.  Cranial Nerves:  II:  Peripheral visual fields grossly normal, pupils equal, round, reactive to light III,IV, VI: ptosis not present, extra-ocular motions intact bilaterally  V,VII: smile symmetric, facial light touch sensation equal VIII: hearing grossly normal bilaterally  IX,X: midline uvula rise  XI: bilateral shoulder shrug equal and strong XII: midline tongue extension  Motor:  5/5 in upper and lower extremities bilaterally including strong and equal grip strength and dorsiflexion/plantar flexion Sensory: Pinprick and light touch normal in all extremities.  Deep Tendon Reflexes: 2+ and symmetric  Cerebellar: normal finger-to-nose with bilateral upper extremities; normal heel shin with bilateral lower extremities; pt sways with Romberg, but does not require assistance. Gait: normal gait while gently steadying herself on someone's arm, but she is not required to lean on them for support.  Pt reports severe dizziness with position change, sitting and standing CV: distal pulses palpable throughout   Skin: Skin is warm and dry. No rash noted. She is not diaphoretic.  Psychiatric: She has a normal mood and affect. Her behavior is normal. Judgment and thought content normal.  Nursing note and vitals reviewed.   ED Course  Procedures (including critical care time) DIAGNOSTIC STUDIES: Oxygen Saturation is 97% on RA, normal by my interpretation.    COORDINATION OF CARE: 11:52 PM-Discussed treatment plan which includes CXR, CT head without  contrast, CBC, and BMP with pt at bedside and pt agreed to plan.   Labs Review Labs Reviewed  CBC - Abnormal; Notable for the following:    RDW 19.5 (*)    All other components within normal limits  BASIC METABOLIC PANEL - Abnormal; Notable for the following:    Glucose, Bld 147 (*)    All other components within normal limits    Imaging Review Dg Chest 2 View  01/20/2016  CLINICAL DATA:  Acute onset of headache and dizziness. Initial encounter. EXAM: CHEST  2 VIEW COMPARISON:  Chest radiograph performed 08/31/2014, and CTA of the chest performed 11/29/2014 FINDINGS: The lungs are well-aerated. Small right and small to moderate left pleural effusions are seen, with mild bibasilar opacities, possibly reflecting atelectasis or pneumonia. There is no evidence of focal opacification, pleural effusion or pneumothorax. The heart is normal in size; the mediastinal contour is within normal limits. No acute osseous abnormalities are seen. Scattered clips are seen overlying the right axilla. IMPRESSION: Small right and small to  moderate left pleural effusions, with mild bibasilar opacities, possibly reflecting atelectasis or pneumonia. Electronically Signed   By: Garald Balding M.D.   On: 01/20/2016 00:44   Ct Head Wo Contrast  01/20/2016  CLINICAL DATA:  Acute onset of headache and intermittent nausea. Off balance. Current history of breast cancer, status post chemotherapy. Initial encounter. EXAM: CT HEAD WITHOUT CONTRAST TECHNIQUE: Contiguous axial images were obtained from the base of the skull through the vertex without intravenous contrast. COMPARISON:  None. FINDINGS: Multiple masses are noted at the high parietal lobes bilaterally, with associated vague white matter hypoattenuation and mild associated mass effect, and high attenuation nodularity about the gyri. Additional foci of decreased attenuation are noted at the central left frontal lobe and medial left occipital lobe, and scattered throughout  the cerebellar hemispheres bilaterally. Underlying decreased attenuation is noted within the cerebellar hemispheres, with mild associated mass effect. Given the patient's history, this is concerning for diffuse metastatic disease. There is no definite evidence of transtentorial herniation, though there is some degree of effacement of the cisterns about the pons. No subfalcine herniation is seen. There is no evidence of significant midline shift. There is no evidence of fracture; visualized osseous structures are unremarkable in appearance. The orbits are within normal limits. The paranasal sinuses and mastoid air cells are well-aerated. No significant soft tissue abnormalities are seen. IMPRESSION: Extensive metastatic disease within the cerebrum and cerebellum bilaterally, with some degree of mass effect and diffuse vague white matter hypoattenuation. Some degree of effacement of the cisterns about the pons, without definite evidence of herniation at this time. No midline shift seen. MRI would be helpful for further evaluation, when and as deemed clinically appropriate. These results were called by telephone at the time of interpretation on 01/20/2016 at 12:16 am to [facility] PA, who verbally acknowledged these results. Electronically Signed   By: Garald Balding M.D.   On: 01/20/2016 00:16   I have personally reviewed and evaluated these images and lab results as part of my medical decision-making.    MDM   Final diagnoses:  Brain metastases ([place])  Breast cancer of upper-outer quadrant of right female breast ([place])  Tachycardia  Nonintractable headache, unspecified chronicity pattern, unspecified headache type   Zacaria L Revak presents with 9 days of persistent headache, worse in the morning.  Hx of BRCA, concern for possible mets vs other headache. No hx of migraines.  Fluids, medication and CT pending.  Pt with dry cough for several days.  CXR and basic blood work also pending.     12:46 AM Discussed findings of CT scan with patient and family (daughter and significant other) at bedside.    1:06 AM Discussed with Dr. Nicole Edwards who does not recommend hospitalization, but does recommend decadron 26m QID and close oncology f/u.    Labs reassuring.  CXR with bilateral pleural effusions. Pt without hypoxia or SOB.  Pt without leukocytosis, fever to suggest pneumonia; concern they are 2/2 metastasis.     1:42 AM Discussed these findings and Dr. SLes Pourecommendation with the patient who does not wish to be admitted at this time.  She is in agreement with the treatment plan. Her headache and dizziness are beginning to improve and she requires less assistance with walking.  No focal neurologic symptoms noted.  Pt with mild, persistent tachycardia.  This has been present intermittently based on record review, but persistent for visits beginning in Dec 2016.  She reports tachycardia at baseline.  She remains without CP  or SOB.  Pt was given a fluid bolus.    BP 110/72 mmHg  Pulse 103  Temp(Src) 98.2 F (36.8 C) (Oral)  Resp 18  SpO2 96%  I personally performed the services described in this documentation, which was scribed in my presence. The recorded information has been reviewed and is accurate.     Jarrett Soho Pete Schnitzer, PA-C 01/20/16 [place], DO 01/20/16 [id number]

## 2016-01-20 ENCOUNTER — Emergency Department (HOSPITAL_COMMUNITY): Payer: Commercial Managed Care - HMO

## 2016-01-20 LAB — CBC
HEMATOCRIT: 38.4 % (ref 36.0–46.0)
HEMOGLOBIN: 13.1 g/dL (ref 12.0–15.0)
MCH: 32.1 pg (ref 26.0–34.0)
MCHC: 34.1 g/dL (ref 30.0–36.0)
MCV: 94.1 fL (ref 78.0–100.0)
Platelets: 321 10*3/uL (ref 150–400)
RBC: 4.08 MIL/uL (ref 3.87–5.11)
RDW: 19.5 % — ABNORMAL HIGH (ref 11.5–15.5)
WBC: 7.4 10*3/uL (ref 4.0–10.5)

## 2016-01-20 LAB — BASIC METABOLIC PANEL
ANION GAP: 10 (ref 5–15)
BUN: 12 mg/dL (ref 6–20)
CHLORIDE: 101 mmol/L (ref 101–111)
CO2: 27 mmol/L (ref 22–32)
Calcium: 9.8 mg/dL (ref 8.9–10.3)
Creatinine, Ser: 0.77 mg/dL (ref 0.44–1.00)
GFR calc non Af Amer: >60 mL/min (ref >60)
GLUCOSE: 147 mg/dL — AB (ref 65–99)
Potassium: 4.1 mmol/L (ref 3.5–5.1)
Sodium: 138 mmol/L (ref 135–145)

## 2016-01-20 MED ORDER — ZOFRAN ODT 8 MG PO TBDP: ORAL_TABLET | ORAL | Status: DC

## 2016-01-20 MED ORDER — DEXAMETHASONE SODIUM PHOSPHATE 10 MG/ML IJ SOLN
10.0000 mg | Freq: Once | INTRAMUSCULAR | Status: AC
  Administered 2016-01-20: 10 mg via INTRAVENOUS
  Filled 2016-01-20: qty 1

## 2016-01-20 MED ORDER — DEXAMETHASONE 4 MG PO TABS: 4.0000 mg | ORAL_TABLET | Freq: Four times a day (QID) | ORAL | Status: DC

## 2016-01-20 MED ORDER — OMEPRAZOLE 20 MG PO CPDR: 20.0000 mg | DELAYED_RELEASE_CAPSULE | Freq: Every day | ORAL | Status: DC

## 2016-01-20 NOTE — Discharge Instructions (Signed)
1. Medications: decadron, zofran, omeprazole, usual home medications 2. Treatment: rest, drink plenty of fluids,  3. Follow Up: Please followup with Dr. Lindi Adie in 2 days for discussion of your diagnoses and further evaluation after today's visit; if you do not have a primary care doctor use the resource guide provided to find one; Please return to the ER for seizures, confusion, shortness of breath, weakness, worsening neurologic symptoms or other concerns

## 2016-01-22 ENCOUNTER — Other Ambulatory Visit: Payer: Commercial Managed Care - HMO

## 2016-01-22 ENCOUNTER — Ambulatory Visit
Admission: RE | Admit: 2016-01-22 | Payer: Commercial Managed Care - HMO | Source: Ambulatory Visit | Admitting: Radiation Oncology

## 2016-01-22 ENCOUNTER — Telehealth: Payer: Self-pay | Admitting: *Deleted

## 2016-01-22 ENCOUNTER — Ambulatory Visit
Admission: RE | Admit: 2016-01-22 | Discharge: 2016-01-22 | Disposition: A | Discharge status: Home / Self Care | Payer: Commercial Managed Care - HMO | Source: Ambulatory Visit | Attending: Radiation Oncology | Admitting: Radiation Oncology

## 2016-01-22 ENCOUNTER — Ambulatory Visit (HOSPITAL_BASED_OUTPATIENT_CLINIC_OR_DEPARTMENT_OTHER): Payer: Commercial Managed Care - HMO | Admitting: Hematology and Oncology

## 2016-01-22 ENCOUNTER — Other Ambulatory Visit: Payer: Self-pay | Admitting: *Deleted

## 2016-01-22 ENCOUNTER — Other Ambulatory Visit: Payer: Self-pay | Admitting: Radiation Oncology

## 2016-01-22 ENCOUNTER — Ambulatory Visit: Payer: Commercial Managed Care - HMO | Admitting: Radiation Oncology

## 2016-01-22 ENCOUNTER — Ambulatory Visit: Payer: Commercial Managed Care - HMO | Admitting: Hematology and Oncology

## 2016-01-22 ENCOUNTER — Other Ambulatory Visit: Payer: Self-pay | Admitting: Radiation Therapy

## 2016-01-22 ENCOUNTER — Telehealth: Payer: Self-pay | Admitting: Hematology and Oncology

## 2016-01-22 ENCOUNTER — Encounter: Payer: Self-pay | Admitting: Hematology and Oncology

## 2016-01-22 VITALS — BP 129/72 | HR 93 | Temp 97.9°F | Resp 18 | Ht 68.0 in | Wt 212.8 lb

## 2016-01-22 DIAGNOSIS — Z853 Personal history of malignant neoplasm of breast: Secondary | ICD-10-CM | POA: Insufficient documentation

## 2016-01-22 DIAGNOSIS — R000 Tachycardia, unspecified: Secondary | ICD-10-CM | POA: Diagnosis not present

## 2016-01-22 DIAGNOSIS — C50411 Malignant neoplasm of upper-outer quadrant of right female breast: Secondary | ICD-10-CM | POA: Diagnosis not present

## 2016-01-22 DIAGNOSIS — C7931 Secondary malignant neoplasm of brain: Secondary | ICD-10-CM

## 2016-01-22 DIAGNOSIS — Z803 Family history of malignant neoplasm of breast: Secondary | ICD-10-CM | POA: Diagnosis not present

## 2016-01-22 DIAGNOSIS — C773 Secondary and unspecified malignant neoplasm of axilla and upper limb lymph nodes: Secondary | ICD-10-CM | POA: Diagnosis not present

## 2016-01-22 DIAGNOSIS — Z8042 Family history of malignant neoplasm of prostate: Secondary | ICD-10-CM | POA: Diagnosis not present

## 2016-01-22 DIAGNOSIS — Z9011 Acquired absence of right breast and nipple: Secondary | ICD-10-CM | POA: Insufficient documentation

## 2016-01-22 DIAGNOSIS — Z510 Encounter for antineoplastic radiation therapy: Secondary | ICD-10-CM | POA: Diagnosis present

## 2016-01-22 DIAGNOSIS — I2699 Other pulmonary embolism without acute cor pulmonale: Secondary | ICD-10-CM | POA: Insufficient documentation

## 2016-01-22 DIAGNOSIS — Z86718 Personal history of other venous thrombosis and embolism: Secondary | ICD-10-CM | POA: Diagnosis not present

## 2016-01-22 DIAGNOSIS — Z171 Estrogen receptor negative status [ER-]: Secondary | ICD-10-CM

## 2016-01-22 DIAGNOSIS — Z800 Family history of malignant neoplasm of digestive organs: Secondary | ICD-10-CM | POA: Insufficient documentation

## 2016-01-22 DIAGNOSIS — R011 Cardiac murmur, unspecified: Secondary | ICD-10-CM | POA: Diagnosis not present

## 2016-01-22 MED ORDER — DEXAMETHASONE 4 MG PO TABS: ORAL_TABLET | ORAL | Status: AC

## 2016-01-22 MED ORDER — DEXAMETHASONE 4 MG PO TABS: ORAL_TABLET | ORAL | Status: DC

## 2016-01-22 NOTE — Assessment & Plan Note (Addendum)
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 Isidore Moos completed 08/14/2015  CurrentTreatment: Adjuvant Xeloda 1000 mg/m 2 weeks on 1 week off for 6 months started 09/07/2015 this is cycle 7  Xeloda toxicities: 1. Headache intermittently. 2. Mild nausea on the first day of chemotherapy Denies any diarrhea, nausea, mouth sores, rash on hands or feet.  PALB2: I discussed with her about meeting with her gynecologist regarding pelvic exams were ovarian cancer surveillance and screening.  Brain metastases detected on a CT of the head done in ER for severe headaches: I discussed with her that I would like to get a brain MRI for further characterization of these lesions. I called and discussed the case with Dr. Isidore Moos who agreed to see her today.  I would also like to obtain a whole-body PET/CT scan for complete staging evaluation. Return to clinic after radiation therapy to discuss systemic therapy plan. I recommended that she continue Xeloda throughout radiation therapy.  Prognosis: Unfortunately patient has triple negative disease with widespread bony metastases suggesting a very poor prognosis. I discussed with her the goals of treatment are to prolong her life and to help improve her symptoms. Unfortunately we cannot cure stage IV breast cancer. I offered her palliative care referral.

## 2016-01-22 NOTE — Telephone Encounter (Signed)
Called patient to inform of scan on 01-24-16, arrival time - 9:15 am, spoke with patient and she is aware of this test

## 2016-01-22 NOTE — Progress Notes (Addendum)
Radiation Oncology         [phone] ________________________________  Outpatient Re-Consultation  Name: Jamie Edwards MRN: [id number]  Date: 01/22/2016  DOB: 07-Feb-1971  TW:[id number] Theda Sers, MD  Nicholas Lose, MD   REFERRING PHYSICIAN: Nicholas Lose, MD  DIAGNOSIS:     ICD-9-CM ICD-10-CM   1. Brain metastases (HCC) 198.3 C79.31 CT Head W Wo Contrast     dexamethasone (DECADRON) 4 MG tablet    HISTORY OF PRESENT ILLNESS::Jamie Edwards is a 45 y.o. female who is well known me. She has been receiving xeloda with Dr Lindi Adie most recently for her locally advanced breast cancer.  She had a 2 wk h/o severe pressure like HAs, and a dry cough, and this prompted a visit to the ED. CT head without contrast shows multiple lesions c/w brain metastases.  She has chest wall expanders that prohibit an MRI. CXR showed pleural effusions. Decadron '4mg'$  QID has helped her HAs completely.  She has a little imbalance. No seizure, nausea, slurred speech, confusion, weakness, numbness, or fine motor skill issues.   PREVIOUS RADIATION THERAPY: Yes  06/30/2015-08/14/2015   Site/dose:    1) Right Chest Wall / 50.4 Gy in 28 fractions 2) Right Supraclavicular fossa/ 50.4 Gy in 28 fractions 3) Right Posterior Axillary boost / 8.876 Gy in 28 fractions 4) Right Chest Wall Scar boost / 10 Gy in 5 fractions     PAST MEDICAL HISTORY:  has a past medical history of Increased heart rate (11/2014); Pulmonary embolism ([place]) (11/2014); DVT (deep venous thrombosis) ([place]) (11/2014); Cancer of right breast ([place]) (08/2014); and Heart murmur.    PAST SURGICAL HISTORY: Past Surgical History  Procedure Laterality Date  . Mouth surgery      implants  . Portacath placement Right 08/31/2014    Procedure: INSERTION PORT-A-CATH WITH ULTRA SOUND GUIDENCE;  Surgeon: Erroll Luna, MD;  Location: [place];  Service: General;  Laterality: Right;  . Mastectomy complete / simple w/ sentinel node  biopsy Right 04/12/2015  . Mastectomy complete / simple Left 04/12/2015    PROPHYLATIC  . Breast reconstruction with placement of tissue expander and flex hd (acellular hydrated dermis) Bilateral 04/12/2015  . Port-a-cath removal  04/12/2015  . Breast biopsy Right 07/2014  . Simple mastectomy with axillary sentinel node biopsy Bilateral 04/12/2015    Procedure: BILATERAL TOTAL MASTECTOMY WITH RIGHT AXILLARY SENTINEL LYMPH  NODE BIOPSY, POSSIBLE AXILLARY DISSECTION, LEFT PROPHYLATIC;  Surgeon: Excell Seltzer, MD;  Location: [place];  Service: General;  Laterality: Bilateral;  . Port-a-cath removal Right 04/12/2015    Procedure: REMOVAL PORT-A-CATH;  Surgeon: Excell Seltzer, MD;  Location: [place];  Service: General;  Laterality: Right;  . Breast reconstruction with placement of tissue expander and flex hd (acellular hydrated dermis) Bilateral 04/12/2015    Procedure: IMMEDIATE BILATERAL BREAST RECONSTRUCTION WITH PLACEMENT OF TISSUE EXPANDER AND FLEX HD (ACELLULAR HYDRATED DERMIS);  Surgeon: Theodoro Kos, DO;  Location: [place];  Service: Plastics;  Laterality: Bilateral;    FAMILY HISTORY: family history includes Breast cancer (age of onset: 28) in her mother; Cancer in her cousin; Esophageal cancer in her paternal uncle; Prostate cancer (age of onset: 33) in her father.  SOCIAL HISTORY:  reports that she has never smoked. She has never used smokeless tobacco. She reports that she does not drink alcohol or use illicit drugs.  ALLERGIES: Erythromycin base; Penicillins; and Zithromax  MEDICATIONS:  Current Outpatient Prescriptions  Medication Sig Dispense Refill  . acetaminophen (TYLENOL) 325 MG tablet Take  650 mg by mouth every 6 (six) hours as needed for moderate pain.    . capecitabine (XELODA) 500 MG tablet Take 4 tablets (2,000 mg total) by mouth 2 (two) times daily after a meal. Take for 14 days then off for 1 week Every 21 days 112 tablet 1  . dexamethasone (DECADRON) 4 MG tablet Take 1 tablet TID  starting 4-18. Then on 5-2 taper to 1 tablet BID. 100 tablet 0  . diphenhydrAMINE (BENADRYL) 25 mg capsule Take 25 mg by mouth every 6 (six) hours as needed for allergies.    . fluticasone (FLONASE) 50 MCG/ACT nasal spray Place 1 spray into both nostrils daily as needed for allergies or rhinitis.    Marland Kitchen loratadine (CLARITIN) 10 MG tablet Take 10 mg by mouth daily as needed for allergies.    Marland Kitchen omeprazole (PRILOSEC) 20 MG capsule Take 1 capsule (20 mg total) by mouth daily. 30 capsule 0  . Sodium Chloride-Sodium Bicarb (NETI POT SINUS WASH) 2300-700 MG KIT Place 1 application into the nose daily as needed. Congestion    . UNABLE TO FIND 1 each by Other route as needed. Dispense Class 1 preventative compression sleeve 20-26m/Hg for this patient with a hx of breast CA s/p surgery 1 each 1  . ZOFRAN ODT 8 MG disintegrating tablet '8mg'$  ODT q4 hours prn nausea 20 tablet 0   No current facility-administered medications for this encounter.    REVIEW OF SYSTEMS:     Pertinent items are noted in HPI.   PHYSICAL EXAM:  Vitals with BMI 01/22/2016  Height '5\' 8"'$   Weight 212 lbs 13 oz  BMI 318.5 Systolic 1631 Diastolic 72  Pulse 93  Respirations 18   General: Alert and oriented, in no acute distress HEENT: Head is normocephalic. Extraocular movements are intact. Oropharynx is clear. Heart: Regular in rate and rhythm with no murmurs, rubs, or gallops. Chest: decreased sounds at bases Abdomen: normoactive bowel sounds Skin: slight acneiform rash on face Musculoskeletal: symmetric strength and muscle tone throughout. Neurologic: Cranial nerves II through XII are grossly intact. No obvious focalities. Speech is fluent. Coordination is intact. Psychiatric: Judgment and insight are intact. Affect is appropriate.     LABORATORY DATA:  Lab Results  Component Value Date   WBC 7.4 01/20/2016   HGB 13.1 01/20/2016   HCT 38.4 01/20/2016   MCV 94.1 01/20/2016   PLT 321 01/20/2016   CMP     Component  Value Date/Time   NA 138 01/20/2016 0015   NA 139 12/04/2015 1357   K 4.1 01/20/2016 0015   K 3.8 12/04/2015 1357   CL 101 01/20/2016 0015   CO2 27 01/20/2016 0015   CO2 28 12/04/2015 1357   GLUCOSE 147* 01/20/2016 0015   GLUCOSE 113 12/04/2015 1357   BUN 12 01/20/2016 0015   BUN 9.0 12/04/2015 1357   CREATININE 0.77 01/20/2016 0015   CREATININE 0.9 12/04/2015 1357   CALCIUM 9.8 01/20/2016 0015   CALCIUM 9.5 12/04/2015 1357   PROT 7.6 12/04/2015 1357   ALBUMIN 3.7 12/04/2015 1357   AST 27 12/04/2015 1357   ALT 17 12/04/2015 1357   ALKPHOS 157* 12/04/2015 1357   BILITOT 0.75 12/04/2015 1357   GFRNONAA >60 01/20/2016 0015   GFRAA >60 01/20/2016 0015         RADIOGRAPHY: Dg Chest 2 View  01/20/2016  CLINICAL DATA:  Acute onset of headache and dizziness. Initial encounter. EXAM: CHEST  2 VIEW COMPARISON:  Chest radiograph performed 08/31/2014,  and CTA of the chest performed 11/29/2014 FINDINGS: The lungs are well-aerated. Small right and small to moderate left pleural effusions are seen, with mild bibasilar opacities, possibly reflecting atelectasis or pneumonia. There is no evidence of focal opacification, pleural effusion or pneumothorax. The heart is normal in size; the mediastinal contour is within normal limits. No acute osseous abnormalities are seen. Scattered clips are seen overlying the right axilla. IMPRESSION: Small right and small to moderate left pleural effusions, with mild bibasilar opacities, possibly reflecting atelectasis or pneumonia. Electronically Signed   By: Garald Balding M.D.   On: 01/20/2016 00:44   Ct Head Wo Contrast  01/20/2016  CLINICAL DATA:  Acute onset of headache and intermittent nausea. Off balance. Current history of breast cancer, status post chemotherapy. Initial encounter. EXAM: CT HEAD WITHOUT CONTRAST TECHNIQUE: Contiguous axial images were obtained from the base of the skull through the vertex without intravenous contrast. COMPARISON:  None.  FINDINGS: Multiple masses are noted at the high parietal lobes bilaterally, with associated vague white matter hypoattenuation and mild associated mass effect, and high attenuation nodularity about the gyri. Additional foci of decreased attenuation are noted at the central left frontal lobe and medial left occipital lobe, and scattered throughout the cerebellar hemispheres bilaterally. Underlying decreased attenuation is noted within the cerebellar hemispheres, with mild associated mass effect. Given the patient's history, this is concerning for diffuse metastatic disease. There is no definite evidence of transtentorial herniation, though there is some degree of effacement of the cisterns about the pons. No subfalcine herniation is seen. There is no evidence of significant midline shift. There is no evidence of fracture; visualized osseous structures are unremarkable in appearance. The orbits are within normal limits. The paranasal sinuses and mastoid air cells are well-aerated. No significant soft tissue abnormalities are seen. IMPRESSION: Extensive metastatic disease within the cerebrum and cerebellum bilaterally, with some degree of mass effect and diffuse vague white matter hypoattenuation. Some degree of effacement of the cisterns about the pons, without definite evidence of herniation at this time. No midline shift seen. MRI would be helpful for further evaluation, when and as deemed clinically appropriate. These results were called by telephone at the time of interpretation on 01/20/2016 at 12:16 am to [facility] PA, who verbally acknowledged these results. Electronically Signed   By: Garald Balding M.D.   On: 01/20/2016 00:16      IMPRESSION/PLAN: This is a very pleasant 45 year old woman with metastatic disease to the brain.  I had a lengthy discussion with the patient and family after reviewing their MRI results with them.  We spoke about whole brain radiotherapy versus stereotactic  radiosurgery to the brain. I will order CT with and without contrast for better delineation of her disease (Cannot undergo MRI due to implants).  I am not optimistic that she will be an SRS candidate - disease looks extensive on initial imaging.   We spoke about the differing risks benefits and side effects of both of these treatments. During part of our discussion, we spoke about the hair loss, fatigue and cognitive effects that can result from whole brain radiotherapy.  Additionally, we spoke about radionecrosis that can result from stereotactic radiosurgery. I explained that whole brain radiotherapy is more comprehensive and therefore can decrease the chance of recurrences elsewhere in the brain, while stereotactic radiosurgery only treats the areas of gross disease while sparing the rest of the brain parenchyma.  After lengthy discussion, the patient would like to proceed CT imaging as  recommended  CT simulation will take place after imaging is complete.   Decadron will be tapered to '4mg'$  TID tomorrow, then '4mg'$  BID on 5/2 indefinitely.  PET pending to stage body.   40 min spent face to face with patient; over 50% on counseling and coordination of care.   __________________________________________   Eppie Gibson, MD

## 2016-01-22 NOTE — Progress Notes (Signed)
Patient Care Team: Kelton Pillar, MD as PCP - General (Family Medicine) Nicholas Lose, MD as Consulting Physician (Hematology and Oncology) Excell Seltzer, MD as Consulting Physician (General Surgery) Eppie Gibson, MD as Attending Physician (Radiation Oncology) Holley Bouche, NP as Nurse Practitioner (Nurse Practitioner) Sylvan Cheese, NP as Nurse Practitioner (Hematology and Oncology)  DIAGNOSIS: Breast cancer of upper-outer quadrant of right female breast [facility])   Staging form: Breast, AJCC 7th Edition     Clinical: Stage IIIA (T3, N1, M0) - Unsigned       Staging comments: Staged at breast conference 11.11.15      Pathologic stage from 04/12/2015: Stage IIIA (yT3, N1a, cM0) - Unsigned   SUMMARY OF ONCOLOGIC HISTORY:   Breast cancer of upper-outer quadrant of right female breast ([place])   08/03/2014 Breast US Right breast: 4.3 x 1.7 x 2.9 cm irregular hypoechoic mass at the 10 o'clock position of the right breast 8 cm from the nipple. At least 3 enlarged right axillary lymph nodes are identified.   08/05/2014 Initial Biopsy Right breast core needle bx: Invasive ductal carcinoma, grade 3 ER 0%, PR 0%, HER-2 negative, Ki-67 90%.  Right axillary lymph node positive for metastatic mammary carcinoma   08/12/2014 Breast MRI 4.5x 7.5 x4 cm biopsy-proven right breast upper outer quadrant consistent with biopsy-proven cancer. 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    09/08/2014 - 02/02/2015 Neo-Adjuvant Chemotherapy Dose dense Adriamycin and Cytoxan x4 followed by weekly Taxol and carboplatin x4; Abraxane 8   02/01/2015 Breast MRI Marked improvement in the breast  tumor and resolution of the right axillary lymph node, there are 3 new spots in the breast one beneath the area of unclear significance   04/12/2015 Definitive Surgery Bilateral Mastectomy; Right: IDC 5.7 cm grade 3, 3/8 LN pos (1 with micro met and 1 with ECE) Left Mastectomy: Benign   04/12/2015 Pathologic Stage Stage IIIA: ypT3 ypN1a   06/30/2015 - 08/14/2015 Radiation Therapy Adjuvant RT Isidore Moos): Right Chest Wall / 50.4 Gy in 28 fractions 2) Right Supraclavicular fossa/ 50.4 Gy in 28 fractions 3) Right Posterior Axillary boost / 8.876 Gy in 28 fractions 4) Right Chest Wall Scar boost / 10 Gy in 5 fractions   09/07/2015 -  Chemotherapy Adjuvant Xeloda 2000 mg by mouth twice a day 2 weeks on 1 week off 6 months   10/05/2015 Survivorship Survivorship visit completed and copy of care plan given to patient.   01/19/2016 Imaging CT head performed for headaches: Extensive metastatic disease within the cerebrum and cerebellum bilaterally with some degree of mass effect and diffuse vague white matter hypoattenuation, effacement of cisterns about the pons    CHIEF COMPLIANT: brain metastases  INTERVAL HISTORY: Jamie Edwards is a 45 year old with above-mentioned history triple negative breast cancer who presented to the emergency room 1 week and then had a CT of the head that showed diffuse bilateral brain metastases. She was started on Decadron and she is here today to discuss the treatment plan. She is accompanied by her sister. She reports that prior to this her headaches were intense. There were also associated with slight loss of balance. Otherwise she did not have any focal motor sensory on  cranial nerve deficits.  REVIEW OF SYSTEMS:   Constitutional: Denies fevers, chills or abnormal weight loss Eyes: Denies blurriness of vision Ears, nose, mouth, throat, and face: Denies mucositis or sore throat Respiratory: Denies cough, dyspnea or wheezes Cardiovascular: Denies palpitation, chest  discomfort Gastrointestinal:  Denies nausea, heartburn or change in bowel habits Skin: Denies abnormal skin rashes Lymphatics: Denies new lymphadenopathy or easy bruising Neurological:headaches and slight imbalance Behavioral/Psych: Mood is stable, no new changes  Extremities: No lower extremity edema All other systems were reviewed with the patient and are negative.  I have reviewed the past medical history, past surgical history, social history and family history with the patient and they are unchanged from previous note.  ALLERGIES:  is allergic to erythromycin base; penicillins; and zithromax.  MEDICATIONS:  Current Outpatient Prescriptions  Medication Sig Dispense Refill  . acetaminophen (TYLENOL) 325 MG tablet Take 650 mg by mouth every 6 (six) hours as needed for moderate pain.    . capecitabine (XELODA) 500 MG tablet Take 4 tablets (2,000 mg total) by mouth 2 (two) times daily after a meal. Take for 14 days then off for 1 week Every 21 days 112 tablet 1  . dexamethasone (DECADRON) 4 MG tablet Take 1 tablet (4 mg total) by mouth 4 (four) times daily. 1 tab QID x4 days, 1 tab TID x2 days, 1 tab BID x2 days, 1 tab QD x2 days, 0.5 tab QD x 2 days 29 tablet 0  . diphenhydrAMINE (BENADRYL) 25 mg capsule Take 25 mg by mouth every 6 (six) hours as needed for allergies.    . fluticasone (FLONASE) 50 MCG/ACT nasal spray Place 1 spray into both nostrils daily as needed for allergies or rhinitis.    Marland Kitchen loratadine (CLARITIN) 10 MG tablet Take 10 mg by mouth daily as needed for allergies.    Marland Kitchen omeprazole (PRILOSEC) 20 MG capsule Take 1 capsule (20 mg total) by mouth daily. 30 capsule 0  . Sodium Chloride-Sodium Bicarb (NETI POT SINUS WASH) 2300-700 MG KIT Place 1 application into the nose daily as needed. Congestion    . UNABLE TO FIND 1 each by Other route as needed. Dispense Class 1 preventative compression sleeve 20-28m/Hg for this patient with a hx of breast CA s/p surgery 1 each 1  . ZOFRAN  ODT 8 MG disintegrating tablet 815mODT q4 hours prn nausea 20 tablet 0   No current facility-administered medications for this visit.    PHYSICAL EXAMINATION: ECOG PERFORMANCE STATUS: 1 - Symptomatic but completely ambulatory  Filed Vitals:   01/22/16 1220  BP: 129/72  Pulse: 93  Temp: 97.9 F (36.6 C)  Resp: 18   Filed Weights   01/22/16 1220  Weight: 212 lb 12.8 oz (96.525 kg)    GENERAL:alert, no distress and comfortable SKIN: skin color, texture, turgor are normal, no rashes or significant lesions EYES: normal, Conjunctiva are pink and non-injected, sclera clear OROPHARYNX:no exudate, no erythema and lips, buccal mucosa, and tongue normal  NECK: supple, thyroid normal size, non-tender, without nodularity LYMPH:  no palpable lymphadenopathy in the cervical, axillary or inguinal LUNGS: clear to auscultation and percussion with normal breathing effort HEART: regular rate & rhythm and no murmurs and no lower extremity edema ABDOMEN:abdomen soft, non-tender and normal bowel sounds MUSCULOSKELETAL:no cyanosis of digits and no clubbing  NEURO: alert & oriented x 3 with fluent speech, no focal motor/sensory deficits EXTREMITIES: No lower extremity edema  LABORATORY DATA:  I have reviewed the data as listed   Chemistry  Component Value Date/Time   NA 138 01/20/2016 0015   NA 139 12/04/2015 1357   K 4.1 01/20/2016 0015   K 3.8 12/04/2015 1357   CL 101 01/20/2016 0015   CO2 27 01/20/2016 0015   CO2 28 12/04/2015 1357   BUN 12 01/20/2016 0015   BUN 9.0 12/04/2015 1357   CREATININE 0.77 01/20/2016 0015   CREATININE 0.9 12/04/2015 1357      Component Value Date/Time   CALCIUM 9.8 01/20/2016 0015   CALCIUM 9.5 12/04/2015 1357   ALKPHOS 157* 12/04/2015 1357   AST 27 12/04/2015 1357   ALT 17 12/04/2015 1357   BILITOT 0.75 12/04/2015 1357      Lab Results  Component Value Date   WBC 7.4 01/20/2016   HGB 13.1 01/20/2016   HCT 38.4 01/20/2016   MCV 94.1  01/20/2016   PLT 321 01/20/2016   NEUTROABS 3.7 12/04/2015   ASSESSMENT & PLAN:  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 Isidore Moos completed 08/14/2015  CurrentTreatment: Adjuvant Xeloda 1000 mg/m 2 weeks on 1 week off for 6 months started 09/07/2015 this is cycle 7  Xeloda toxicities: 1. Headache intermittently. 2. Mild nausea on the first day of chemotherapy Denies any diarrhea, nausea, mouth sores, rash on hands or feet.  PALB2: I discussed with her about meeting with her gynecologist regarding pelvic exams were ovarian cancer surveillance and screening.  Brain metastases detected on a CT of the head done in ER for severe headaches. I called and discussed the case with Dr. Isidore Moos who agreed to see her today. Patient has breast expenders and apparently cannot get a brain MRI.  I would also like to obtain a whole-body PET/CT scan for complete staging evaluation. Return to clinic after radiation therapy to discuss systemic therapy plan.  Prognosis: Unfortunately patient has triple negative disease with widespread bony metastases suggesting a very poor prognosis. I discussed with her the goals of treatment are to prolong her life and to help improve her symptoms. Unfortunately we cannot cure stage IV breast cancer. I offered her palliative care referral.  We will consider enrolling her in immunotherapy trial with Pembrolizumab  No orders of the defined  types were placed in this encounter.   The patient has a good understanding of the overall plan. she agrees with it. she will call with any problems that may develop before the next visit here.   Rulon Eisenmenger, MD 01/22/2016

## 2016-01-22 NOTE — Telephone Encounter (Signed)
Patient called to schedule f/u after visit to ED on Saturday. Patient to come in at 12:00 today, urgent pof sent. Patient aware of appt time.

## 2016-01-22 NOTE — Telephone Encounter (Signed)
PET CT to be sch by central radiology. Follow up appt will be made once date/time of PET is established

## 2016-01-23 ENCOUNTER — Other Ambulatory Visit: Payer: Commercial Managed Care - HMO

## 2016-01-23 ENCOUNTER — Ambulatory Visit: Payer: Commercial Managed Care - HMO | Admitting: Hematology and Oncology

## 2016-01-24 ENCOUNTER — Ambulatory Visit
Admission: RE | Admit: 2016-01-24 | Discharge: 2016-01-24 | Disposition: A | Discharge status: Home / Self Care | Payer: Commercial Managed Care - HMO | Source: Ambulatory Visit | Attending: Radiation Oncology | Admitting: Radiation Oncology

## 2016-01-24 ENCOUNTER — Ambulatory Visit (HOSPITAL_COMMUNITY)
Admission: RE | Admit: 2016-01-24 | Discharge: 2016-01-24 | Disposition: A | Discharge status: Home / Self Care | Payer: Commercial Managed Care - HMO | Source: Ambulatory Visit | Attending: Radiation Oncology | Admitting: Radiation Oncology

## 2016-01-24 ENCOUNTER — Encounter (HOSPITAL_COMMUNITY): Payer: Self-pay

## 2016-01-24 DIAGNOSIS — C7931 Secondary malignant neoplasm of brain: Secondary | ICD-10-CM | POA: Diagnosis present

## 2016-01-24 DIAGNOSIS — Z510 Encounter for antineoplastic radiation therapy: Secondary | ICD-10-CM | POA: Diagnosis not present

## 2016-01-24 MED ORDER — IOPAMIDOL (ISOVUE-300) INJECTION 61%
75.0000 mL | Freq: Once | INTRAVENOUS | Status: AC | PRN
  Administered 2016-01-24: 75 mL via INTRAVENOUS

## 2016-01-24 NOTE — Progress Notes (Signed)
  SIMULATION AND TREATMENT PLANNING NOTE  Outpatient  DIAGNOSIS:     ICD-9-CM ICD-10-CM   1. Brain metastases ([place]) 198.3 C79.31     NARRATIVE:  The patient was brought to the [facility]. I reviewed her CT results from this AM.  I recommend whole brain RT based on the extent of her brain disease.  We talked about goals of care. She understands radiotherapy would be palliative.  She has an amazing attitude and wants to proceed.  Identity was confirmed.  All relevant records and images related to the planned course of therapy were reviewed.  The patient freely provided informed written consent to proceed with treatment after reviewing the details related to the planned course of therapy. The consent form was witnessed and verified by the simulation staff.    Then, the patient was set-up in a stable reproducible  supine position for radiation therapy.  An Aquaplast facemask was custom fitted to the patient's anatomy.  CT images were obtained.  Surface markings were placed.  The CT images were loaded into the planning software.      TREATMENT PLANNING NOTE: Treatment planning then occurred.  The radiation prescription was entered and confirmed.    A total of 3 medically necessary complex treatment devices were fabricated and supervised by me, in the form of an Aquaplast mask and 2 fields with MLCs to block globes, pharynx, spinal cord, lenses. Wedges will be used as needed for dose homogeneity.  MORE FIELDS WITH MLCs MAY BE ADDED IN DOSIMETRY for dose homogeneity.  I have requested : Isodose Plan.     The patient will receive 35 Gy in 14 fractions to the whole brain.  -----------------------------------  Eppie Gibson, MD

## 2016-01-25 ENCOUNTER — Ambulatory Visit
Admission: RE | Admit: 2016-01-25 | Discharge: 2016-01-25 | Disposition: A | Discharge status: Home / Self Care | Payer: Commercial Managed Care - HMO | Source: Ambulatory Visit | Attending: Radiation Oncology | Admitting: Radiation Oncology

## 2016-01-25 ENCOUNTER — Other Ambulatory Visit: Payer: Commercial Managed Care - HMO

## 2016-01-25 DIAGNOSIS — Z510 Encounter for antineoplastic radiation therapy: Secondary | ICD-10-CM | POA: Diagnosis not present

## 2016-01-26 ENCOUNTER — Other Ambulatory Visit: Payer: Commercial Managed Care - HMO

## 2016-01-26 ENCOUNTER — Ambulatory Visit: Payer: Commercial Managed Care - HMO | Admitting: Physical Therapy

## 2016-01-26 ENCOUNTER — Ambulatory Visit
Admission: RE | Admit: 2016-01-26 | Discharge: 2016-01-26 | Disposition: A | Discharge status: Home / Self Care | Payer: Commercial Managed Care - HMO | Source: Ambulatory Visit | Attending: Radiation Oncology | Admitting: Radiation Oncology

## 2016-01-26 ENCOUNTER — Ambulatory Visit: Payer: Commercial Managed Care - HMO

## 2016-01-26 ENCOUNTER — Ambulatory Visit
Admission: RE | Admit: 2016-01-26 | Payer: Commercial Managed Care - HMO | Source: Ambulatory Visit | Admitting: Radiation Oncology

## 2016-01-26 ENCOUNTER — Ambulatory Visit: Payer: Commercial Managed Care - HMO | Admitting: Hematology and Oncology

## 2016-01-26 DIAGNOSIS — Z510 Encounter for antineoplastic radiation therapy: Secondary | ICD-10-CM | POA: Diagnosis not present

## 2016-01-29 ENCOUNTER — Telehealth: Payer: Self-pay | Admitting: *Deleted

## 2016-01-29 ENCOUNTER — Ambulatory Visit
Admission: RE | Admit: 2016-01-29 | Discharge: 2016-01-29 | Disposition: A | Discharge status: Home / Self Care | Payer: Commercial Managed Care - HMO | Source: Ambulatory Visit | Attending: Radiation Oncology | Admitting: Radiation Oncology

## 2016-01-29 ENCOUNTER — Encounter: Payer: Self-pay | Admitting: Radiation Oncology

## 2016-01-29 VITALS — BP 108/73 | HR 69 | Temp 97.5°F | Ht 68.0 in | Wt 210.5 lb

## 2016-01-29 DIAGNOSIS — Z510 Encounter for antineoplastic radiation therapy: Secondary | ICD-10-CM | POA: Diagnosis not present

## 2016-01-29 DIAGNOSIS — C7931 Secondary malignant neoplasm of brain: Secondary | ICD-10-CM

## 2016-01-29 DIAGNOSIS — C50411 Malignant neoplasm of upper-outer quadrant of right female breast: Secondary | ICD-10-CM

## 2016-01-29 MED ORDER — ONDANSETRON 8 MG PO TBDP: ORAL_TABLET | ORAL | Status: AC

## 2016-01-29 MED ORDER — OMEPRAZOLE 20 MG PO CPDR: 20.0000 mg | DELAYED_RELEASE_CAPSULE | Freq: Every day | ORAL | Status: AC

## 2016-01-29 MED ORDER — SONAFINE EX EMUL
1.0000 "application " | Freq: Once | CUTANEOUS | Status: AC
  Administered 2016-01-29: 1 via TOPICAL
  Filled 2016-01-29: qty 45

## 2016-01-29 NOTE — Progress Notes (Signed)
Jamie Edwards is here for her 3rd fraction of radiation to her Whole Brain. She reports some fatigue, and feels more sleepy than she normally does. She denies pain and states she no longer has headaches. She is eating well. She also denies nausea or any other side effect related to radiation. She was provided education and sonafine cream to use for skin irritation.  BP 108/73 mmHg  Pulse 69  Temp(Src) 97.5 F (36.4 C)  Ht 5\' 8"  (1.727 m)  Wt 210 lb 8 oz (95.482 kg)  BMI 32.01 kg/m2   Wt Readings from Last 3 Encounters:  01/29/16 210 lb 8 oz (95.482 kg)  01/22/16 212 lb 12.8 oz (96.525 kg)  12/04/15 220 lb 3.2 oz (99.882 kg)

## 2016-01-29 NOTE — Telephone Encounter (Signed)
Called patient to ask about coming earlier today to be seen by Dr. Isidore Moos , patient agreed to do so.

## 2016-01-29 NOTE — Progress Notes (Signed)
   Weekly Management Note:  Outpatient C79.31 Brain Metastases  Current Dose:  7.5 Gy  Projected Dose: 35 Gy   Narrative:  The patient presents for routine under treatment assessment.  CBCT/MVCT images/Port film x-rays were reviewed.  The chart was checked. She is doing well and denies headaches. Sleeping well.   Physical Findings:  height is 5\' 8"  (1.727 m) and weight is 210 lb 8 oz (95.482 kg). Her temperature is 97.5 F (36.4 C). Her blood pressure is 108/73 and her pulse is 69.   Wt Readings from Last 3 Encounters:  01/29/16 210 lb 8 oz (95.482 kg)  01/22/16 212 lb 12.8 oz (96.525 kg)  12/04/15 220 lb 3.2 oz (99.882 kg)   NAD, some thrush in mouth  Impression:  The patient is tolerating radiotherapy.  Plan:  Continue radiotherapy as planned. Rx fluconazole today. Continue dexamethasone taper   ________________________________   Eppie Gibson, M.D.

## 2016-01-29 NOTE — Addendum Note (Signed)
Encounter addended by: Joen Laura, [facility] on: 01/29/2016  3:37 PM<BR>     Documentation filed: Rx Order Verification

## 2016-01-29 NOTE — Addendum Note (Signed)
Encounter addended by: Ernst Spell, RN on: 01/29/2016  3:37 PM<BR>     Documentation filed: [employer], Orders

## 2016-01-29 NOTE — Addendum Note (Signed)
Encounter addended by: Ernst Spell, RN on: 01/29/2016  4:37 PM<BR>     Documentation filed: Inpatient MAR

## 2016-01-30 ENCOUNTER — Ambulatory Visit
Admission: RE | Admit: 2016-01-30 | Discharge: 2016-01-30 | Disposition: A | Discharge status: Home / Self Care | Payer: Commercial Managed Care - HMO | Source: Ambulatory Visit | Attending: Radiation Oncology | Admitting: Radiation Oncology

## 2016-01-30 DIAGNOSIS — Z510 Encounter for antineoplastic radiation therapy: Secondary | ICD-10-CM | POA: Diagnosis not present

## 2016-01-30 NOTE — Progress Notes (Signed)
Pt here for patient teaching.  Pt given Radiation and You booklet, skin care instructions and Sonafine. Pt reports they have watched the Radiation Therapy Education video with her previous radiation.  Reviewed areas of pertinence such as fatigue, hair loss, nausea and vomiting, skin changes, headache, blurry vision, breast swelling, cough, shortness of breath, earaches and taste changes . Pt able to give teach back of to pat skin, use unscented/gentle soap and drink plenty of water,apply Sonafine bid and avoid applying anything to skin within 4 hours of treatment. Pt verbalizes understanding of information given and will contact nursing with any questions or concerns.     [email or web address]

## 2016-01-31 ENCOUNTER — Ambulatory Visit
Admission: RE | Admit: 2016-01-31 | Discharge: 2016-01-31 | Disposition: A | Discharge status: Home / Self Care | Payer: Commercial Managed Care - HMO | Source: Ambulatory Visit | Attending: Radiation Oncology | Admitting: Radiation Oncology

## 2016-01-31 ENCOUNTER — Ambulatory Visit (HOSPITAL_COMMUNITY)
Admission: RE | Admit: 2016-01-31 | Discharge: 2016-01-31 | Disposition: A | Discharge status: Home / Self Care | Payer: Commercial Managed Care - HMO | Source: Ambulatory Visit | Attending: Hematology and Oncology | Admitting: Hematology and Oncology

## 2016-01-31 DIAGNOSIS — C7951 Secondary malignant neoplasm of bone: Secondary | ICD-10-CM | POA: Diagnosis not present

## 2016-01-31 DIAGNOSIS — C7802 Secondary malignant neoplasm of left lung: Secondary | ICD-10-CM | POA: Diagnosis not present

## 2016-01-31 DIAGNOSIS — C50411 Malignant neoplasm of upper-outer quadrant of right female breast: Secondary | ICD-10-CM | POA: Diagnosis present

## 2016-01-31 DIAGNOSIS — C779 Secondary and unspecified malignant neoplasm of lymph node, unspecified: Secondary | ICD-10-CM | POA: Diagnosis not present

## 2016-01-31 DIAGNOSIS — Z510 Encounter for antineoplastic radiation therapy: Secondary | ICD-10-CM | POA: Diagnosis not present

## 2016-01-31 DIAGNOSIS — C7801 Secondary malignant neoplasm of right lung: Secondary | ICD-10-CM | POA: Insufficient documentation

## 2016-01-31 DIAGNOSIS — C787 Secondary malignant neoplasm of liver and intrahepatic bile duct: Secondary | ICD-10-CM | POA: Insufficient documentation

## 2016-01-31 LAB — GLUCOSE, CAPILLARY: Glucose-Capillary: 225 mg/dL — ABNORMAL HIGH (ref 65–99)

## 2016-01-31 MED ORDER — FLUDEOXYGLUCOSE F - 18 (FDG) INJECTION
10.2000 | Freq: Once | INTRAVENOUS | Status: AC | PRN
  Administered 2016-01-31: 10.2 via INTRAVENOUS

## 2016-01-31 NOTE — Addendum Note (Signed)
Encounter addended by: Ernst Spell, RN on: 01/31/2016  1:06 PM<BR>     Documentation filed: Charges VN

## 2016-02-01 ENCOUNTER — Ambulatory Visit
Admission: RE | Admit: 2016-02-01 | Discharge: 2016-02-01 | Disposition: A | Discharge status: Home / Self Care | Payer: Commercial Managed Care - HMO | Source: Ambulatory Visit | Attending: Radiation Oncology | Admitting: Radiation Oncology

## 2016-02-01 DIAGNOSIS — Z510 Encounter for antineoplastic radiation therapy: Secondary | ICD-10-CM | POA: Diagnosis not present

## 2016-02-02 ENCOUNTER — Ambulatory Visit
Admission: RE | Admit: 2016-02-02 | Discharge: 2016-02-02 | Disposition: A | Discharge status: Home / Self Care | Payer: Commercial Managed Care - HMO | Source: Ambulatory Visit | Attending: Radiation Oncology | Admitting: Radiation Oncology

## 2016-02-02 ENCOUNTER — Other Ambulatory Visit: Payer: Self-pay

## 2016-02-02 ENCOUNTER — Ambulatory Visit: Payer: Commercial Managed Care - HMO | Admitting: Physical Therapy

## 2016-02-02 DIAGNOSIS — Z510 Encounter for antineoplastic radiation therapy: Secondary | ICD-10-CM | POA: Diagnosis not present

## 2016-02-02 DIAGNOSIS — C50411 Malignant neoplasm of upper-outer quadrant of right female breast: Secondary | ICD-10-CM

## 2016-02-02 MED ORDER — CAPECITABINE 500 MG PO TABS: 1000.0000 mg/m2 | ORAL_TABLET | Freq: Two times a day (BID) | ORAL | Status: AC

## 2016-02-05 ENCOUNTER — Other Ambulatory Visit (HOSPITAL_BASED_OUTPATIENT_CLINIC_OR_DEPARTMENT_OTHER): Payer: Commercial Managed Care - HMO

## 2016-02-05 ENCOUNTER — Ambulatory Visit
Admission: RE | Admit: 2016-02-05 | Discharge: 2016-02-05 | Disposition: A | Discharge status: Home / Self Care | Payer: Commercial Managed Care - HMO | Source: Ambulatory Visit | Attending: Radiation Oncology | Admitting: Radiation Oncology

## 2016-02-05 ENCOUNTER — Ambulatory Visit (HOSPITAL_BASED_OUTPATIENT_CLINIC_OR_DEPARTMENT_OTHER): Payer: Commercial Managed Care - HMO | Admitting: Hematology and Oncology

## 2016-02-05 ENCOUNTER — Encounter: Payer: Self-pay | Admitting: Hematology and Oncology

## 2016-02-05 ENCOUNTER — Telehealth: Payer: Self-pay | Admitting: Hematology and Oncology

## 2016-02-05 ENCOUNTER — Encounter: Payer: Self-pay | Admitting: Radiation Oncology

## 2016-02-05 VITALS — BP 105/72 | HR 104 | Temp 98.0°F | Wt 206.3 lb

## 2016-02-05 VITALS — BP 109/80 | HR 96 | Temp 97.9°F | Resp 18 | Ht 68.0 in | Wt 204.8 lb

## 2016-02-05 DIAGNOSIS — C50411 Malignant neoplasm of upper-outer quadrant of right female breast: Secondary | ICD-10-CM

## 2016-02-05 DIAGNOSIS — Z510 Encounter for antineoplastic radiation therapy: Secondary | ICD-10-CM | POA: Diagnosis not present

## 2016-02-05 DIAGNOSIS — C773 Secondary and unspecified malignant neoplasm of axilla and upper limb lymph nodes: Secondary | ICD-10-CM

## 2016-02-05 DIAGNOSIS — C7931 Secondary malignant neoplasm of brain: Secondary | ICD-10-CM

## 2016-02-05 DIAGNOSIS — C7951 Secondary malignant neoplasm of bone: Secondary | ICD-10-CM

## 2016-02-05 DIAGNOSIS — Z171 Estrogen receptor negative status [ER-]: Secondary | ICD-10-CM

## 2016-02-05 DIAGNOSIS — C7801 Secondary malignant neoplasm of right lung: Secondary | ICD-10-CM

## 2016-02-05 DIAGNOSIS — C7802 Secondary malignant neoplasm of left lung: Secondary | ICD-10-CM

## 2016-02-05 DIAGNOSIS — C787 Secondary malignant neoplasm of liver and intrahepatic bile duct: Secondary | ICD-10-CM

## 2016-02-05 LAB — COMPREHENSIVE METABOLIC PANEL
ALBUMIN: 3 g/dL — AB (ref 3.5–5.0)
ALT: 30 U/L (ref 0–55)
AST: 17 U/L (ref 5–34)
Alkaline Phosphatase: 124 U/L (ref 40–150)
Anion Gap: 11 mEq/L (ref 3–11)
BUN: 27.5 mg/dL — AB (ref 7.0–26.0)
CHLORIDE: 97 meq/L — AB (ref 98–109)
CO2: 22 mEq/L (ref 22–29)
CREATININE: 1 mg/dL (ref 0.6–1.1)
Calcium: 9.1 mg/dL (ref 8.4–10.4)
EGFR: 77 mL/min/{1.73_m2} — ABNORMAL LOW (ref >90)
GLUCOSE: 467 mg/dL — AB (ref 70–140)
SODIUM: 130 meq/L — AB (ref 136–145)
Total Bilirubin: 0.99 mg/dL (ref 0.20–1.20)
Total Protein: 6.5 g/dL (ref 6.4–8.3)

## 2016-02-05 LAB — CBC WITH DIFFERENTIAL/PLATELET
BASO%: 0 % (ref 0.0–2.0)
BASOS ABS: 0 10*3/uL (ref 0.0–0.1)
EOS%: 0 % (ref 0.0–7.0)
Eosinophils Absolute: 0 10*3/uL (ref 0.0–0.5)
HEMATOCRIT: 41.1 % (ref 34.8–46.6)
HEMOGLOBIN: 14.2 g/dL (ref 11.6–15.9)
LYMPH#: 0.6 10*3/uL — AB (ref 0.9–3.3)
LYMPH%: 2.6 % — ABNORMAL LOW (ref 14.0–49.7)
MCH: 32.6 pg (ref 25.1–34.0)
MCHC: 34.5 g/dL (ref 31.5–36.0)
MCV: 94.5 fL (ref 79.5–101.0)
MONO#: 0.3 10*3/uL (ref 0.1–0.9)
MONO%: 1.1 % (ref 0.0–14.0)
NEUT#: 21 10*3/uL — ABNORMAL HIGH (ref 1.5–6.5)
NEUT%: 96.3 % — ABNORMAL HIGH (ref 38.4–76.8)
Platelets: 127 10*3/uL — ABNORMAL LOW (ref 145–400)
RBC: 4.35 10*6/uL (ref 3.70–5.45)
RDW: 20.1 % — AB (ref 11.2–14.5)
WBC: 21.8 10*3/uL — ABNORMAL HIGH (ref 3.9–10.3)

## 2016-02-05 MED ORDER — PROCHLORPERAZINE MALEATE 10 MG PO TABS: 10.0000 mg | ORAL_TABLET | Freq: Four times a day (QID) | ORAL | Status: DC | PRN

## 2016-02-05 MED ORDER — LIDOCAINE-PRILOCAINE 2.5-2.5 % EX CREA: TOPICAL_CREAM | CUTANEOUS | Status: DC

## 2016-02-05 MED ORDER — FLUCONAZOLE 100 MG PO TABS: ORAL_TABLET | ORAL | Status: AC

## 2016-02-05 MED ORDER — FLUCONAZOLE 100 MG PO TABS: ORAL_TABLET | ORAL | Status: DC

## 2016-02-05 MED ORDER — LORAZEPAM 0.5 MG PO TABS: 0.5000 mg | ORAL_TABLET | Freq: Four times a day (QID) | ORAL | Status: DC | PRN

## 2016-02-05 MED ORDER — ONDANSETRON HCL 8 MG PO TABS: 8.0000 mg | ORAL_TABLET | Freq: Two times a day (BID) | ORAL | Status: DC | PRN

## 2016-02-05 NOTE — Progress Notes (Signed)
Ms. Vano presents for her 8th fraction of radiation to her Whole Brain. She reports fatigue. She denies pain. She is taking 4 mg of decadron three times daily, then will decrease to 4 mg twice daily starting tomorrow. She saw Dr. Lindi Adie today and will stop taking her Xeloda today. She has some thrush in her mouth and was prescribed an oral cream (per patient) and is having some difficulty getting it filled, but it will be filled soon by a special pharmacy. She is using the sonafine cream, she does report some mild skin itching. She does report some blurry vision which has increased since starting radiation.   BP 105/72 mmHg  Pulse 104  Temp(Src) 98 F (36.7 C)  Wt 206 lb 4.8 oz (93.577 kg)  SpO2 100%   Wt Readings from Last 3 Encounters:  02/05/16 206 lb 4.8 oz (93.577 kg)  02/05/16 204 lb 12.8 oz (92.897 kg)  01/29/16 210 lb 8 oz (95.482 kg)

## 2016-02-05 NOTE — Progress Notes (Signed)
Patient Care Team: Kelton Pillar, MD as PCP - General (Family Medicine) Nicholas Lose, MD as Consulting Physician (Hematology and Oncology) Excell Seltzer, MD as Consulting Physician (General Surgery) Eppie Gibson, MD as Attending Physician (Radiation Oncology) Holley Bouche, NP as Nurse Practitioner (Nurse Practitioner) Sylvan Cheese, NP as Nurse Practitioner (Hematology and Oncology)  DIAGNOSIS: Breast cancer of upper-outer quadrant of right female breast [facility])   Staging form: Breast, AJCC 7th Edition     Clinical: Stage IIIA (T3, N1, M0) - Unsigned       Staging comments: Staged at breast conference 11.11.15      Pathologic stage from 04/12/2015: Stage IIIA (yT3, N1a, cM0) - Unsigned   SUMMARY OF ONCOLOGIC HISTORY:   Breast cancer of upper-outer quadrant of right female breast ([place])   08/03/2014 Breast US Right breast: 4.3 x 1.7 x 2.9 cm irregular hypoechoic mass at the 10 o'clock position of the right breast 8 cm from the nipple. At least 3 enlarged right axillary lymph nodes are identified.   08/05/2014 Initial Biopsy Right breast core needle bx: Invasive ductal carcinoma, grade 3 ER 0%, PR 0%, HER-2 negative, Ki-67 90%.  Right axillary lymph node positive for metastatic mammary carcinoma   08/12/2014 Breast MRI 4.5x 7.5 x4 cm biopsy-proven right breast upper outer quadrant consistent with biopsy-proven cancer. 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    09/08/2014 - 02/02/2015 Neo-Adjuvant Chemotherapy Dose dense Adriamycin and Cytoxan x4 followed by weekly Taxol and carboplatin x4; Abraxane 8   02/01/2015 Breast MRI Marked improvement in the breast  tumor and resolution of the right axillary lymph node, there are 3 new spots in the breast one beneath the area of unclear significance   04/12/2015 Definitive Surgery Bilateral Mastectomy; Right: IDC 5.7 cm grade 3, 3/8 LN pos (1 with micro met and 1 with ECE) Left Mastectomy: Benign   04/12/2015 Pathologic Stage Stage IIIA: ypT3 ypN1a   06/30/2015 - 08/14/2015 Radiation Therapy Adjuvant RT Isidore Moos): Right Chest Wall / 50.4 Gy in 28 fractions 2) Right Supraclavicular fossa/ 50.4 Gy in 28 fractions 3) Right Posterior Axillary boost / 8.876 Gy in 28 fractions 4) Right Chest Wall Scar boost / 10 Gy in 5 fractions   09/07/2015 -  Chemotherapy Adjuvant Xeloda 2000 mg by mouth twice a day 2 weeks on 1 week off 6 months   10/05/2015 Survivorship Survivorship visit completed and copy of care plan given to patient.   01/19/2016 Imaging CT head performed for headaches: Extensive metastatic disease within the cerebrum and cerebellum bilaterally with some degree of mass effect and diffuse vague white matter hypoattenuation, effacement of cisterns about the pons   01/26/2016 -  Radiation Therapy Whole brain radiation therapy with Dr. Isidore Moos   01/31/2016 PET scan Hypermetabolic mediastinal and bilateral hilar lymph node metastases, bilateral lung metastases, extensive costophrenic sulci mets, single right liver lobe met, right iliac bone met    CHIEF COMPLIANT: Follow-up of the PET CT scan  INTERVAL HISTORY: Jamie Edwards is a 45 year old with above-mentioned history metastatic breast cancer triple negative disease who underwent a PET CT scan and is here to discuss the results of the scan. She is currently undergoing palliative radiation therapy  to the brain. She is accompanied by her family. She reports that the radiation of the pain is causing her to get very fatigued. She is planning to take FMLA. Denies any cough shortness of breath or chest pain.  REVIEW OF SYSTEMS:   Constitutional: Denies fevers, chills or  abnormal weight loss Eyes: Denies blurriness of vision Ears, nose, mouth, throat, and face: Denies mucositis or sore throat Respiratory: Denies cough, dyspnea or wheezes Cardiovascular: Denies palpitation, chest discomfort Gastrointestinal:  Denies nausea, heartburn or change in bowel habits Skin: Denies abnormal skin rashes Lymphatics: Denies new lymphadenopathy or easy bruising Neurological:Denies numbness, tingling or new weaknesses Behavioral/Psych: Mood is stable, no new changes  Extremities: No lower extremity edema Breast:  denies any pain or lumps or nodules in either breasts All other systems were reviewed with the patient and are negative.  I have reviewed the past medical history, past surgical history, social history and family history with the patient and they are unchanged from previous note.  ALLERGIES:  is allergic to erythromycin base; penicillins; and zithromax.  MEDICATIONS:  Current Outpatient Prescriptions  Medication Sig Dispense Refill  . acetaminophen (TYLENOL) 325 MG tablet Take 650 mg by mouth every 6 (six) hours as needed for moderate pain.    . capecitabine (XELODA) 500 MG tablet Take 4 tablets (2,000 mg total) by mouth 2 (two) times daily after a meal. Take for 14 days then off for 1 week Every 21 days (Patient not taking: Reported on 02/05/2016) 112 tablet 0  . dexamethasone (DECADRON) 4 MG tablet Take 1 tablet TID starting 4-18. Then on 5-2 taper to 1 tablet BID. 100 tablet 0  . diphenhydrAMINE (BENADRYL) 25 mg capsule Take 25 mg by mouth every 6 (six) hours as needed for allergies.    . fluconazole (DIFLUCAN) 100 MG tablet Take 2 tablets today, then 1 tablet daily x 20 more days. 22 tablet 0  . fluticasone (FLONASE) 50 MCG/ACT nasal spray Place 1 spray into both nostrils daily as needed for allergies or rhinitis.    Marland Kitchen loratadine (CLARITIN) 10 MG tablet Take 10 mg by mouth daily as needed for allergies.    Marland Kitchen omeprazole (PRILOSEC) 20 MG capsule Take 1 capsule  (20 mg total) by mouth daily. 30 capsule 2  . ondansetron (ZOFRAN ODT) 8 MG disintegrating tablet '8mg'$  ODT q4 hours prn nausea 20 tablet 5  . UNABLE TO FIND 1 each by Other route as needed. Dispense Class 1 preventative compression sleeve 20-51m/Hg for this patient with a hx of breast CA s/p surgery 1 each 1   No current facility-administered medications for this visit.    PHYSICAL EXAMINATION: ECOG PERFORMANCE STATUS: 1 - Symptomatic but completely ambulatory  Filed Vitals:   02/05/16 1348  BP: 109/80  Pulse: 96  Temp: 97.9 F (36.6 C)  Resp: 18   Filed Weights   02/05/16 1348  Weight: 204 lb 12.8 oz (92.897 kg)    GENERAL:alert, no distress and comfortable SKIN: skin color, texture, turgor are normal, no rashes or significant lesions EYES: normal, Conjunctiva are pink and non-injected, sclera clear OROPHARYNX:no exudate, no erythema and lips, buccal mucosa, and tongue normal  NECK: supple, thyroid normal size, non-tender, without nodularity LYMPH:  no palpable lymphadenopathy in the cervical, axillary or inguinal LUNGS: clear to auscultation and percussion with normal breathing effort HEART: regular rate & rhythm and no murmurs and no lower extremity edema ABDOMEN:abdomen soft, non-tender and normal bowel sounds MUSCULOSKELETAL:no cyanosis of digits and no clubbing  NEURO:  alert & oriented x 3 with fluent speech, no focal motor/sensory deficits EXTREMITIES: No lower extremity edema  LABORATORY DATA:  I have reviewed the data as listed   Chemistry      Component Value Date/Time   NA 130* 02/05/2016 1302   NA 138 01/20/2016 0015   K 5.3 Sl. Hemolysis* 02/05/2016 1302   K 4.1 01/20/2016 0015   CL 101 01/20/2016 0015   CO2 22 02/05/2016 1302   CO2 27 01/20/2016 0015   BUN 27.5* 02/05/2016 1302   BUN 12 01/20/2016 0015   CREATININE 1.0 02/05/2016 1302   CREATININE 0.77 01/20/2016 0015      Component Value Date/Time   CALCIUM 9.1 02/05/2016 1302   CALCIUM 9.8  01/20/2016 0015   ALKPHOS 124 02/05/2016 1302   AST 17 02/05/2016 1302   ALT 30 02/05/2016 1302   BILITOT 0.99 02/05/2016 1302       Lab Results  Component Value Date   WBC 21.8* 02/05/2016   HGB 14.2 02/05/2016   HCT 41.1 02/05/2016   MCV 94.5 02/05/2016   PLT 127* 02/05/2016   NEUTROABS 21.0* 02/05/2016   ASSESSMENT & PLAN:  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 Isidore Moos completed 08/14/2015 Brain metastases detected on a CT of the head done in ER for severe headaches. Patient to get palliative radiation therapy  CurrentTreatment:  1. Palliative radiation therapy to the brain started 01/26/2016  PALB2: I discussed with her about meeting with her gynecologist regarding pelvic exams were ovarian cancer surveillance and screening.  PET/CT scan 01/31/2007: Evidence of hypermetabolic mediastinal and bilateral axillary lymph node metastases, bilateral LN metastases, hypermetabolic pleural-based tumor with anterior and posterior facets of the breasts. Massive Segment 7: 2.9 cm SUV 10.6  Plan: 1. Palliative radiation: whole brain 2. Systemic chemotherapy with gemcitabine day 1 day 8 every 3 weeks to start May 11th If she progresses on Gemcitabine, I will plan treatment with [facility] with Pembrolizumab therapy.  I will request Dr. Excell Seltzer for port placement.  No orders of the defined types were placed in this encounter.   The patient has a  good understanding of the overall plan. she agrees with it. she will call with any problems that may develop before the next visit here.   Rulon Eisenmenger, MD 02/05/2016

## 2016-02-05 NOTE — Assessment & Plan Note (Addendum)
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 Isidore Moos completed 08/14/2015 Brain metastases detected on a CT of the head done in ER for severe headaches. Patient to get palliative radiation therapy  CurrentTreatment:  1. Palliative radiation therapy to the brain started 01/26/2016  PALB2: I discussed with her about meeting with her gynecologist regarding pelvic exams were ovarian cancer surveillance and screening.  PET/CT scan 01/31/2007: Evidence of hypermetabolic mediastinal and bilateral axillary lymph node metastases, bilateral LN metastases, hypermetabolic pleural-based tumor with anterior and posterior facets of the breasts. Massive Segment 7: 2.9 cm SUV 10.6  Return to clinic after radiation therapy to discuss systemic therapy plan.  Plan: 1. Palliative radiation: whole brain 2. Systemic chemotherapy with carboplatin and gemcitabine day 1 day 8 every 3 weeks to start next week. If she progresses on carboplatin and gemcitabine, I will plan treatment with [place] with Pembrolizumab therapy.

## 2016-02-05 NOTE — Progress Notes (Signed)
   Weekly Management Note:  Outpatient C79.31 Brain Metastases  Current Dose:  20 Gy  Projected Dose: 35 Gy   Narrative:  The patient presents for routine under treatment assessment.  CBCT/MVCT images/Port film x-rays were reviewed.  The chart was checked. She is more tired.  Taking Decadron 4mg  TID and will taper to BID tomorrow.  Was "off" all last week from Xeloda, but restarted it yesterday. Today she was told by Dr Lindi Adie to stop it completely.  Vision a bit blurry, no HA's or dizziness.  Could not fill Fluconazole   Physical Findings:  weight is 206 lb 4.8 oz (93.577 kg). Her temperature is 98 F (36.7 C). Her blood pressure is 105/72 and her pulse is 104. Her oxygen saturation is 100%.   Wt Readings from Last 3 Encounters:  02/05/16 206 lb 4.8 oz (93.577 kg)  02/05/16 204 lb 12.8 oz (92.897 kg)  01/29/16 210 lb 8 oz (95.482 kg)   NAD, increased thrush in mouth  Impression:  The patient is tolerating radiotherapy.  Plan:  Continue radiotherapy as planned. Rx fluconazole today to different pharmacy and verified that her insurance will cover it. Continue dexamethasone taper  Holding Xeloda.  Letter written for work to give her time off (fatigued). She knows she is eligible for long term disability.  ________________________________   Eppie Gibson, M.D.

## 2016-02-05 NOTE — Telephone Encounter (Signed)
appt made and avs printed °

## 2016-02-06 ENCOUNTER — Ambulatory Visit
Admission: RE | Admit: 2016-02-06 | Discharge: 2016-02-06 | Disposition: A | Discharge status: Home / Self Care | Payer: Commercial Managed Care - HMO | Source: Ambulatory Visit | Attending: Radiation Oncology | Admitting: Radiation Oncology

## 2016-02-06 DIAGNOSIS — Z510 Encounter for antineoplastic radiation therapy: Secondary | ICD-10-CM | POA: Diagnosis not present

## 2016-02-06 MED FILL — FLUCONAZOLE 100 MG TABLET: 100 | 21 days supply | Qty: 22 | Fill #0

## 2016-02-07 ENCOUNTER — Ambulatory Visit
Admission: RE | Admit: 2016-02-07 | Discharge: 2016-02-07 | Disposition: A | Discharge status: Home / Self Care | Payer: Commercial Managed Care - HMO | Source: Ambulatory Visit | Attending: Radiation Oncology | Admitting: Radiation Oncology

## 2016-02-07 ENCOUNTER — Encounter: Payer: Self-pay | Admitting: Radiation Oncology

## 2016-02-07 DIAGNOSIS — Z510 Encounter for antineoplastic radiation therapy: Secondary | ICD-10-CM | POA: Diagnosis not present

## 2016-02-07 NOTE — Progress Notes (Signed)
Paperwork ([employer] [place]) received 5/3, given to nurse 5/3 Ardeen Fillers)

## 2016-02-08 ENCOUNTER — Ambulatory Visit
Admission: RE | Admit: 2016-02-08 | Discharge: 2016-02-08 | Disposition: A | Discharge status: Home / Self Care | Payer: Commercial Managed Care - HMO | Source: Ambulatory Visit | Attending: Radiation Oncology | Admitting: Radiation Oncology

## 2016-02-08 ENCOUNTER — Telehealth: Payer: Self-pay

## 2016-02-08 ENCOUNTER — Telehealth: Payer: Self-pay | Admitting: *Deleted

## 2016-02-08 VITALS — BP 95/56 | HR 108 | Temp 98.3°F | Resp 12

## 2016-02-08 DIAGNOSIS — C7931 Secondary malignant neoplasm of brain: Secondary | ICD-10-CM

## 2016-02-08 DIAGNOSIS — Z510 Encounter for antineoplastic radiation therapy: Secondary | ICD-10-CM | POA: Diagnosis not present

## 2016-02-08 NOTE — Progress Notes (Signed)
Feeling ill and fatigued and not herself.  She a pleural effusion.  She has increased swelling to bilateral extremities.  Noted a dry cough which started approximately one week ago.  BP 95/56 mmHg  Pulse 108  Temp(Src) 98.3 F (36.8 C) (Oral)  Resp 12  SpO2 98%  Orthostatic VS: BP unchanged, P-135, Oxygen 100%

## 2016-02-08 NOTE — Progress Notes (Signed)
  Radiation Oncology         [phone] ________________________________  Name: Jamie Edwards MRN: [id number]  Date: 02/08/2016  DOB: Apr 22, 1971  Weekly Radiation Therapy Management    ICD-9-CM ICD-10-CM   1. Brain metastases (HCC) 198.3 C79.31      Current Dose: 27.5 Gy     Planned Dose:  35 Gy  Narrative . . . . . . . .                                    The patient asked to be seen today. She is feeling very fatigued. She denies any nausea or headaches. She denies any dizziness with standing. Patient complains that her legs feel very fatigued.                                 Set-up films were reviewed.                                 The chart was checked. Physical Findings. . .  oral temperature is 98.3 F (36.8 C). Her blood pressure is 95/56 and her pulse is 108. Her respiration is 12 and oxygen saturation is 98%. . Patient is alert and oriented and sitting comfortably in wheelchair. She responds Appropriately to questions. Examination of the oral cavity reveals no secondary infection. the mucosa is moist. The lungs are clear. The heart has a regular rhythm with increased rate. The blood pressure does not change significantly with standing however her pulse does increase somewhat.  examination lower extremities reveal some mild edema bilaterally.  Patient has good proximal muscle strength in her lower extremities Impression . . . . . . . The patient appears to be having significant fatigue with her treatment. I discussed that this maybe side effects with her therapy. She will be coming in earlier tomorrow and have suggested consideration for blood work. She wishes to see Dr. Isidore Moos again tomorrow for further evaluation.  Also recommended she forced fluids in case she does have some dehydration in light of her low blood pressure. Plan . . . . . . . . . . . . Continue treatment as planned.   ________________________________   Blair Promise, PhD, MD

## 2016-02-08 NOTE — Telephone Encounter (Signed)
VM message from patient received @ 8:06 am. TC back to patient.. She states that she has developed a vaginal discharge as of last week and it continues even now. States that it is brownish in color and that she thinks there be a little blood in it. She also states she has some issues with hemorroids so is not exactly sure if small amount of blood she she sees is from that or vaginal bleeding.  States she has not had a menstrual cycle since 2015.  She also states she has a dry , non-productive cough which she experiences when she is laying down for the most part.  She states she does not feel bad, denies pain, fever, chills etc. Pt wanted Dr. Lindi Adie to know these things.

## 2016-02-08 NOTE — Telephone Encounter (Signed)
Called pt back in regards to earlier call made to triage.  Pt experiencing new onset of vaginal discharge.  After speaking with pt, she reports no pain/discomfort or other concerns.  Pt informed to monitor and contact us with any changes; otherwise, she will be seen on 5/11 by Dr. Lindi Adie at which time this can be discussed further.  Pt verbalized understanding and no further questions or concerns at this time.

## 2016-02-09 ENCOUNTER — Ambulatory Visit
Admission: RE | Admit: 2016-02-09 | Discharge: 2016-02-09 | Disposition: A | Discharge status: Home / Self Care | Payer: Commercial Managed Care - HMO | Source: Ambulatory Visit | Attending: Radiation Oncology | Admitting: Radiation Oncology

## 2016-02-09 ENCOUNTER — Emergency Department (HOSPITAL_COMMUNITY): Payer: Commercial Managed Care - HMO

## 2016-02-09 ENCOUNTER — Encounter: Payer: Self-pay | Admitting: Radiation Oncology

## 2016-02-09 ENCOUNTER — Other Ambulatory Visit: Payer: Self-pay

## 2016-02-09 ENCOUNTER — Inpatient Hospital Stay (HOSPITAL_COMMUNITY)
Admission: EM | Admit: 2016-02-09 | Discharge: 2016-03-07 | DRG: 853 | Disposition: E | Payer: Commercial Managed Care - HMO | Attending: Emergency Medicine | Admitting: Emergency Medicine

## 2016-02-09 ENCOUNTER — Ambulatory Visit: Payer: Commercial Managed Care - HMO | Admitting: Physical Therapy

## 2016-02-09 ENCOUNTER — Encounter (HOSPITAL_COMMUNITY): Payer: Self-pay

## 2016-02-09 ENCOUNTER — Inpatient Hospital Stay (HOSPITAL_COMMUNITY): Payer: Commercial Managed Care - HMO

## 2016-02-09 VITALS — BP 102/69 | HR 97 | Temp 97.7°F | Resp 20

## 2016-02-09 DIAGNOSIS — Z6836 Body mass index (BMI) 36.0-36.9, adult: Secondary | ICD-10-CM | POA: Diagnosis not present

## 2016-02-09 DIAGNOSIS — E861 Hypovolemia: Secondary | ICD-10-CM | POA: Diagnosis present

## 2016-02-09 DIAGNOSIS — R0600 Dyspnea, unspecified: Secondary | ICD-10-CM

## 2016-02-09 DIAGNOSIS — E0965 Drug or chemical induced diabetes mellitus with hyperglycemia: Secondary | ICD-10-CM | POA: Diagnosis present

## 2016-02-09 DIAGNOSIS — I340 Nonrheumatic mitral (valve) insufficiency: Secondary | ICD-10-CM | POA: Diagnosis present

## 2016-02-09 DIAGNOSIS — Z86718 Personal history of other venous thrombosis and embolism: Secondary | ICD-10-CM | POA: Diagnosis not present

## 2016-02-09 DIAGNOSIS — Z800 Family history of malignant neoplasm of digestive organs: Secondary | ICD-10-CM | POA: Diagnosis not present

## 2016-02-09 DIAGNOSIS — L8990 Pressure ulcer of unspecified site, unspecified stage: Secondary | ICD-10-CM | POA: Diagnosis present

## 2016-02-09 DIAGNOSIS — I2699 Other pulmonary embolism without acute cor pulmonale: Secondary | ICD-10-CM | POA: Diagnosis not present

## 2016-02-09 DIAGNOSIS — J9600 Acute respiratory failure, unspecified whether with hypoxia or hypercapnia: Secondary | ICD-10-CM

## 2016-02-09 DIAGNOSIS — Z7952 Long term (current) use of systemic steroids: Secondary | ICD-10-CM | POA: Diagnosis not present

## 2016-02-09 DIAGNOSIS — I471 Supraventricular tachycardia: Secondary | ICD-10-CM | POA: Diagnosis present

## 2016-02-09 DIAGNOSIS — Z8042 Family history of malignant neoplasm of prostate: Secondary | ICD-10-CM | POA: Diagnosis not present

## 2016-02-09 DIAGNOSIS — C50411 Malignant neoplasm of upper-outer quadrant of right female breast: Secondary | ICD-10-CM | POA: Diagnosis present

## 2016-02-09 DIAGNOSIS — Y95 Nosocomial condition: Secondary | ICD-10-CM | POA: Diagnosis present

## 2016-02-09 DIAGNOSIS — C7802 Secondary malignant neoplasm of left lung: Secondary | ICD-10-CM | POA: Diagnosis present

## 2016-02-09 DIAGNOSIS — A419 Sepsis, unspecified organism: Principal | ICD-10-CM

## 2016-02-09 DIAGNOSIS — G92 Toxic encephalopathy: Secondary | ICD-10-CM | POA: Diagnosis present

## 2016-02-09 DIAGNOSIS — C7801 Secondary malignant neoplasm of right lung: Secondary | ICD-10-CM | POA: Diagnosis present

## 2016-02-09 DIAGNOSIS — C782 Secondary malignant neoplasm of pleura: Secondary | ICD-10-CM | POA: Diagnosis present

## 2016-02-09 DIAGNOSIS — Z79899 Other long term (current) drug therapy: Secondary | ICD-10-CM | POA: Diagnosis not present

## 2016-02-09 DIAGNOSIS — Z9221 Personal history of antineoplastic chemotherapy: Secondary | ICD-10-CM

## 2016-02-09 DIAGNOSIS — E669 Obesity, unspecified: Secondary | ICD-10-CM | POA: Diagnosis present

## 2016-02-09 DIAGNOSIS — R6510 Systemic inflammatory response syndrome (SIRS) of non-infectious origin without acute organ dysfunction: Secondary | ICD-10-CM | POA: Diagnosis present

## 2016-02-09 DIAGNOSIS — T380X5A Adverse effect of glucocorticoids and synthetic analogues, initial encounter: Secondary | ICD-10-CM | POA: Diagnosis present

## 2016-02-09 DIAGNOSIS — E871 Hypo-osmolality and hyponatremia: Secondary | ICD-10-CM | POA: Diagnosis present

## 2016-02-09 DIAGNOSIS — Z853 Personal history of malignant neoplasm of breast: Secondary | ICD-10-CM | POA: Diagnosis present

## 2016-02-09 DIAGNOSIS — Z923 Personal history of irradiation: Secondary | ICD-10-CM

## 2016-02-09 DIAGNOSIS — I2609 Other pulmonary embolism with acute cor pulmonale: Secondary | ICD-10-CM

## 2016-02-09 DIAGNOSIS — R630 Anorexia: Secondary | ICD-10-CM | POA: Diagnosis present

## 2016-02-09 DIAGNOSIS — E872 Acidosis: Secondary | ICD-10-CM | POA: Diagnosis present

## 2016-02-09 DIAGNOSIS — I2782 Chronic pulmonary embolism: Secondary | ICD-10-CM

## 2016-02-09 DIAGNOSIS — R6521 Severe sepsis with septic shock: Secondary | ICD-10-CM | POA: Diagnosis present

## 2016-02-09 DIAGNOSIS — J80 Acute respiratory distress syndrome: Secondary | ICD-10-CM | POA: Diagnosis present

## 2016-02-09 DIAGNOSIS — J189 Pneumonia, unspecified organism: Secondary | ICD-10-CM

## 2016-02-09 DIAGNOSIS — Z803 Family history of malignant neoplasm of breast: Secondary | ICD-10-CM

## 2016-02-09 DIAGNOSIS — R0602 Shortness of breath: Secondary | ICD-10-CM | POA: Diagnosis present

## 2016-02-09 DIAGNOSIS — C771 Secondary and unspecified malignant neoplasm of intrathoracic lymph nodes: Secondary | ICD-10-CM | POA: Diagnosis present

## 2016-02-09 DIAGNOSIS — C7931 Secondary malignant neoplasm of brain: Secondary | ICD-10-CM | POA: Diagnosis present

## 2016-02-09 DIAGNOSIS — Z9011 Acquired absence of right breast and nipple: Secondary | ICD-10-CM | POA: Diagnosis not present

## 2016-02-09 DIAGNOSIS — Z86711 Personal history of pulmonary embolism: Secondary | ICD-10-CM | POA: Diagnosis not present

## 2016-02-09 DIAGNOSIS — R739 Hyperglycemia, unspecified: Secondary | ICD-10-CM | POA: Diagnosis present

## 2016-02-09 DIAGNOSIS — C787 Secondary malignant neoplasm of liver and intrahepatic bile duct: Secondary | ICD-10-CM | POA: Diagnosis present

## 2016-02-09 DIAGNOSIS — Z510 Encounter for antineoplastic radiation therapy: Secondary | ICD-10-CM | POA: Diagnosis present

## 2016-02-09 DIAGNOSIS — Z880 Allergy status to penicillin: Secondary | ICD-10-CM | POA: Diagnosis not present

## 2016-02-09 DIAGNOSIS — F22 Delusional disorders: Secondary | ICD-10-CM

## 2016-02-09 DIAGNOSIS — J9601 Acute respiratory failure with hypoxia: Secondary | ICD-10-CM

## 2016-02-09 DIAGNOSIS — I2692 Saddle embolus of pulmonary artery without acute cor pulmonale: Secondary | ICD-10-CM

## 2016-02-09 DIAGNOSIS — Z9013 Acquired absence of bilateral breasts and nipples: Secondary | ICD-10-CM | POA: Diagnosis not present

## 2016-02-09 DIAGNOSIS — I2602 Saddle embolus of pulmonary artery with acute cor pulmonale: Secondary | ICD-10-CM

## 2016-02-09 DIAGNOSIS — Z881 Allergy status to other antibiotic agents status: Secondary | ICD-10-CM

## 2016-02-09 DIAGNOSIS — C7951 Secondary malignant neoplasm of bone: Secondary | ICD-10-CM | POA: Diagnosis present

## 2016-02-09 DIAGNOSIS — D709 Neutropenia, unspecified: Secondary | ICD-10-CM | POA: Diagnosis present

## 2016-02-09 DIAGNOSIS — R0902 Hypoxemia: Secondary | ICD-10-CM

## 2016-02-09 DIAGNOSIS — R000 Tachycardia, unspecified: Secondary | ICD-10-CM | POA: Diagnosis not present

## 2016-02-09 DIAGNOSIS — R011 Cardiac murmur, unspecified: Secondary | ICD-10-CM | POA: Diagnosis not present

## 2016-02-09 HISTORY — DX: Supraventricular tachycardia: I47.1

## 2016-02-09 HISTORY — DX: Supraventricular tachycardia, unspecified: I47.10

## 2016-02-09 LAB — URINALYSIS, ROUTINE W REFLEX MICROSCOPIC
Bilirubin Urine: NEGATIVE
Glucose, UA: >1000 mg/dL — AB
Ketones, ur: 40 mg/dL — AB
LEUKOCYTES UA: NEGATIVE
Nitrite: NEGATIVE
PROTEIN: NEGATIVE mg/dL
Specific Gravity, Urine: >1.046 — ABNORMAL HIGH (ref 1.005–1.030)
pH: 6.5 (ref 5.0–8.0)

## 2016-02-09 LAB — COMPREHENSIVE METABOLIC PANEL
ALT: 37 U/L (ref 14–54)
ANION GAP: 14 (ref 5–15)
AST: 37 U/L (ref 15–41)
Albumin: 3 g/dL — ABNORMAL LOW (ref 3.5–5.0)
Alkaline Phosphatase: 118 U/L (ref 38–126)
BILIRUBIN TOTAL: 2.7 mg/dL — AB (ref 0.3–1.2)
BUN: 26 mg/dL — ABNORMAL HIGH (ref 6–20)
CALCIUM: 9.6 mg/dL (ref 8.9–10.3)
CO2: 23 mmol/L (ref 22–32)
Chloride: 94 mmol/L — ABNORMAL LOW (ref 101–111)
Creatinine, Ser: 0.81 mg/dL (ref 0.44–1.00)
Glucose, Bld: 468 mg/dL — ABNORMAL HIGH (ref 65–99)
POTASSIUM: 6.7 mmol/L — AB (ref 3.5–5.1)
Sodium: 131 mmol/L — ABNORMAL LOW (ref 135–145)
TOTAL PROTEIN: 6.4 g/dL — AB (ref 6.5–8.1)

## 2016-02-09 LAB — BRAIN NATRIURETIC PEPTIDE: B NATRIURETIC PEPTIDE 5: 196.3 pg/mL — AB (ref 0.0–100.0)

## 2016-02-09 LAB — CBC WITH DIFFERENTIAL/PLATELET
Basophils Absolute: 0 10*3/uL (ref 0.0–0.1)
Basophils Relative: 0 %
Eosinophils Absolute: 0 10*3/uL (ref 0.0–0.7)
Eosinophils Relative: 0 %
HEMATOCRIT: 41.1 % (ref 36.0–46.0)
Hemoglobin: 14.7 g/dL (ref 12.0–15.0)
LYMPHS PCT: 3 %
Lymphs Abs: 0.2 10*3/uL — ABNORMAL LOW (ref 0.7–4.0)
MCH: 33 pg (ref 26.0–34.0)
MCHC: 35.8 g/dL (ref 30.0–36.0)
MCV: 92.2 fL (ref 78.0–100.0)
MONO ABS: 0.1 10*3/uL (ref 0.1–1.0)
Monocytes Relative: 1 %
NEUTROS ABS: 6.4 10*3/uL (ref 1.7–7.7)
NEUTROS PCT: 96 %
Platelets: 154 10*3/uL (ref 150–400)
RBC: 4.46 MIL/uL (ref 3.87–5.11)
RDW: 20.4 % — ABNORMAL HIGH (ref 11.5–15.5)
WBC: 6.7 10*3/uL (ref 4.0–10.5)

## 2016-02-09 LAB — URINE MICROSCOPIC-ADD ON

## 2016-02-09 LAB — I-STAT CG4 LACTIC ACID, ED: LACTIC ACID, VENOUS: 3.85 mmol/L — AB (ref 0.5–2.0)

## 2016-02-09 LAB — CBG MONITORING, ED: Glucose-Capillary: 393 mg/dL — ABNORMAL HIGH (ref 65–99)

## 2016-02-09 MED ORDER — SODIUM CHLORIDE 0.9 % IV BOLUS (SEPSIS): 1000.0000 mL | Freq: Once | INTRAVENOUS | Status: DC

## 2016-02-09 MED ORDER — SODIUM CHLORIDE 0.9 % IV BOLUS (SEPSIS)
1000.0000 mL | Freq: Once | INTRAVENOUS | Status: AC
  Administered 2016-02-10: 1000 mL via INTRAVENOUS

## 2016-02-09 MED ORDER — DEXAMETHASONE 4 MG PO TABS
4.0000 mg | ORAL_TABLET | Freq: Once | ORAL | Status: AC
  Administered 2016-02-09: 4 mg via ORAL
  Filled 2016-02-09: qty 1

## 2016-02-09 MED ORDER — VANCOMYCIN HCL IN DEXTROSE 1-5 GM/200ML-% IV SOLN: 1000.0000 mg | Freq: Once | INTRAVENOUS | Status: DC

## 2016-02-09 MED ORDER — DEXTROSE 5 % IV SOLN
500.0000 mg | INTRAVENOUS | Status: DC
  Filled 2016-02-09: qty 500

## 2016-02-09 MED ORDER — SODIUM CHLORIDE 0.9 % IV BOLUS (SEPSIS)
1000.0000 mL | Freq: Once | INTRAVENOUS | Status: AC
  Administered 2016-02-09: 1000 mL via INTRAVENOUS

## 2016-02-09 MED ORDER — DEXTROSE 5 % IV SOLN: 2.0000 g | Freq: Once | INTRAVENOUS | Status: DC

## 2016-02-09 MED ORDER — DEXTROSE 5 % IV SOLN
1.0000 g | INTRAVENOUS | Status: DC
  Filled 2016-02-09: qty 10

## 2016-02-09 NOTE — H&P (Signed)
Jamie Edwards BLT:[id number] DOB: [birth year] DOA: 02/13/2016   Referring MD Zenia Resides PCP: Osborne Casco, MD   Outpatient Specialists:Rd Elgie Collard, oncology Lindi Adie Excell Seltzer, MD   (General Surgery) Patient coming from: home  Chief Complaint: fatigue after  Radiation treatment with increased shortness of breath.   HPI: Jamie Edwards is a 45 y.o. 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 Vision no headaches or dizziness. Patient reporting nonproductive dry cough worse when she lays down. She developed thrush and was started on Diflucan.  Over the past few days she started to feel more ill and fatigued as increased lower extremity swelling her blood pressure has gone down to 95/56 and her last treatment she was tachycardic up to 108 Patient has had decreased by mouth intake and decreased appetite she has been forcing herself to eat. developed a new bed sore. Patient   endorses increased urination and thirst Patient became so tired she could not even stand up on her own she discussed this with her radiation oncologist who recommended presentation to emergency department for further workup    Regarding pertinent Chronic problems: Patient has been diagnosed with breast cancer since October 2015 Status neoadjuvant chemotherapy followed by bilateral mastectomy and radiation therapy in April 2017 was found to have extensive metastatic disease within the cerebellum and cerebrum bilaterally with some degree of mass effect and diffuse white matter hypoattenuation is started to undergo whole brain radiation therapy by Dr. Isidore Moos. PET scan from 26th of April showed hypermetabolic mediastinal bilateral hilar lymph node metastases bilateral lung metastases and extensive liver metastasis as well as metastases to right iliac  bone. Patient has been taking Decadron. Her Xeloda is currently on hold. The plan was for patient to have a Port-A-Cath placed and the restart chemotherapy Patient history history of pulmonary embolism in February 2016  and DVT bilateral upper extremities.she was initially started on Lovenox and then transition to [place] this was discontinued 6 months after In the past she had history of supraventricular tachycardia and has been on metoprolol and was stopped in June 2016 patient have had some mild tachycardia since but since been asymptomatic Echogram done of every 2016 showed preserved EF and mild mitral regurgitation Of note patient Port-A-Cath has been removed   IN ER: UA showing no evidence of UTI but elevated glucose amount and ketones White blood cell count 6.7 hemoglobin 14.7 platelets 154 absolute neutrophil count 6.4 absolute lymphocyte count 0.2 Audio noted to be down to 131 potassium was 6.7 but thought to be likely hemolyzed,  glucose noted to be elevated at 468 right and 18 14 bicarbonate 20 3 PM and 3.0 lactic acid 3.85 Creatinine 0.81 Chest x-ray showing small left pleural effusion and mild patchy opacity in the medial right base Of note she has known metastatic disease involving the right lung Patient met sirs criteria during tachycardia up to 121 elevated lactic acid 3.8,  elevated respiratory rate up to 30 and hypotension 98/87  [facility] M has been consulted for line placement  Hospitalist was called for admission for SIRS  Review of Systems:    Pertinent positives include:  fatigue, weight loss   Constitutional:  No weight loss, night sweats, Fevers, chills, loss of appetite, Bilateral lower extremity swelling  HEENT:  No headaches, Difficulty swallowing,Tooth/dental problems,Sore throat,  No sneezing, itching, ear ache,  nasal congestion, post nasal drip,  Cardio-vascular:  No chest pain, Orthopnea, PND, anasarca, dizziness, palpitations.no  GI:  No heartburn,  indigestion, abdominal pain, nausea, vomiting, diarrhea, change in bowel habits,  melena, blood in stool, hematemesis Resp:  no shortness of breath at rest. No dyspnea on exertion, No excess mucus, no productive cough, No non-productive cough, No coughing up of blood.No change in color of mucus.No wheezing. Skin:  no rash or lesions. No jaundice GU:  no dysuria, change in color of urine, no urgency or frequency. No straining to urinate.  No flank pain.  Musculoskeletal:  No joint pain or no joint swelling. No decreased range of motion. No back pain.  Psych:  No change in mood or affect. No depression or anxiety. No memory loss.  Neuro: no localizing neurological complaints, no tingling, no weakness, no double vision, no gait abnormality, no slurred speech, no confusion  As per HPI otherwise 10 point review of systems negative.   Past Medical History: Past Medical History  Diagnosis Date  . Increased heart rate 11/2014    pt. put on metoprolol  . Pulmonary embolism ([place]) 11/2014  . DVT (deep venous thrombosis) ([place]) 11/2014    BUE  . Cancer of right breast ([place]) 08/2014    S/P chemo 09/2014-02/02/2015  . Heart murmur     pt. states a doctor stated she had a slight murmur, no studies done. Present PCP has never mention it to her.  . SVT (supraventricular tachycardia) [facility])    Past Surgical History  Procedure Laterality Date  . Mouth surgery      implants  . Portacath placement Right 08/31/2014    Procedure: INSERTION PORT-A-CATH WITH ULTRA SOUND GUIDENCE;  Surgeon: Erroll Luna, MD;  Location: [place];  Service: General;  Laterality: Right;  . Mastectomy complete / simple w/ sentinel node biopsy Right 04/12/2015  . Mastectomy complete / simple Left 04/12/2015    PROPHYLATIC  . Breast reconstruction with placement of tissue expander and flex hd (acellular hydrated dermis) Bilateral 04/12/2015  . Port-a-cath removal  04/12/2015  . Breast biopsy Right 07/2014  . Simple  mastectomy with axillary sentinel node biopsy Bilateral 04/12/2015    Procedure: BILATERAL TOTAL MASTECTOMY WITH RIGHT AXILLARY SENTINEL LYMPH  NODE BIOPSY, POSSIBLE AXILLARY DISSECTION, LEFT PROPHYLATIC;  Surgeon: Excell Seltzer, MD;  Location: [place];  Service: General;  Laterality: Bilateral;  . Port-a-cath removal Right 04/12/2015    Procedure: REMOVAL PORT-A-CATH;  Surgeon: Excell Seltzer, MD;  Location: [place];  Service: General;  Laterality: Right;  . Breast reconstruction with placement of tissue expander and flex hd (acellular hydrated dermis) Bilateral 04/12/2015    Procedure: IMMEDIATE BILATERAL BREAST RECONSTRUCTION WITH PLACEMENT OF TISSUE EXPANDER AND FLEX HD (ACELLULAR HYDRATED DERMIS);  Surgeon: Theodoro Kos, DO;  Location: [place];  Service: Plastics;  Laterality: Bilateral;     Social History:  Ambulatory  independently  Prior to starting radiation therapy Lives at home  With family     reports that she has never smoked. She has never used smokeless tobacco. She reports that she does not drink alcohol or use illicit drugs.  Allergies:   Allergies  Allergen Reactions  . Erythromycin Base     Other reaction(s): GI Upset (intolerance)  . Penicillins Other (See Comments)    Has patient had a PCN reaction causing immediate rash, facial/tongue/throat swelling, SOB or lightheadedness with hypotension:No Has patient had a PCN reaction causing severe rash involving mucus membranes or skin necrosis:No Has patient  had a PCN reaction that required hospitalization:No Has patient had a PCN reaction occurring within the last 10 years:No If all of the above answers are "NO", then may proceed with Cephalosporin use. Stomach upset  . Zithromax [Azithromycin] Diarrhea       Family History:    Family History  Problem Relation Age of Onset  . Breast cancer Mother 21  . Prostate cancer Father 63  . Esophageal cancer Paternal Uncle     smoker  . Cancer Cousin      Medications: Prior to Admission medications   Medication Sig Start Date End Date Taking? Authorizing Provider  dexamethasone (DECADRON) 4 MG tablet Take 1 tablet TID starting 4-18. Then on 5-2 taper to 1 tablet BID. Patient taking differently: Take 4 mg by mouth 2 (two) times daily. Take 1 tablet TID starting 4-18. Then on 5-2 taper to 1 tablet BID. 01/22/16  Yes Eppie Gibson, MD  fluconazole (DIFLUCAN) 100 MG tablet Take 2 tablets today, then 1 tablet daily x 20 more days. Patient taking differently: Take 100 mg by mouth 2 (two) times daily. Take 2 tablets today, then 1 tablet daily x 20 more days. 02/05/16  Yes Eppie Gibson, MD  ondansetron (ZOFRAN) 8 MG tablet Take 1 tablet (8 mg total) by mouth 2 (two) times daily as needed for refractory nausea / vomiting. Start on day 3 after chemotherapy. 02/05/16  Yes Nicholas Lose, MD  prochlorperazine (COMPAZINE) 10 MG tablet Take 1 tablet (10 mg total) by mouth every 6 (six) hours as needed (Nausea or vomiting). 02/05/16  Yes Nicholas Lose, MD  capecitabine (XELODA) 500 MG tablet Take 4 tablets (2,000 mg total) by mouth 2 (two) times daily after a meal. Take for 14 days then off for 1 week Every 21 days Patient not taking: Reported on 02/05/2016 02/02/16   Nicholas Lose, MD  lidocaine-prilocaine (EMLA) cream Apply to affected area once Patient not taking: Reported on 02/17/2016 02/05/16   Nicholas Lose, MD  LORazepam (ATIVAN) 0.5 MG tablet Take 1 tablet (0.5 mg total) by mouth every 6 (six) hours as needed (Nausea or vomiting). Patient not taking: Reported on 02/19/2016 02/05/16   Nicholas Lose, MD  omeprazole (PRILOSEC) 20 MG capsule Take 1 capsule (20 mg total) by mouth daily. Patient not taking: Reported on 02/25/2016 01/29/16   Eppie Gibson, MD  ondansetron (ZOFRAN ODT) 8 MG disintegrating tablet '8mg'$  ODT q4 hours prn nausea Patient not taking: Reported on 02/08/2016 01/29/16   Eppie Gibson, MD  UNABLE TO FIND 1 each by Other route as needed. Dispense Class 1 preventative  compression sleeve 20-50m/Hg for this patient with a hx of breast CA s/p surgery 11/03/15   VNicholas Lose MD    Physical Exam: Patient Vitals for the past 24 hrs:  BP Temp Temp src Pulse Resp SpO2 Height Weight  02/26/2016 2200 98/87 mmHg - - (!) 125 25 96 % - -  03/06/2016 2148 166/63 mmHg - - (!) 121 25 96 % - -  03/05/2016 2130 144/63 mmHg - - (!) 123 (!) 28 98 % - -  03/01/2016 2114 - - - - - 99 % - -  02/20/2016 2100 128/93 mmHg - - 118 (!) 30 98 % - -  02/15/2016 1820 - - - 115 (!) 30 92 % - -  02/29/2016 1812 105/80 mmHg 99.2 F (37.3 C) Oral 116 14 94 % '5\' 3"'$  (1.6 m) 93.441 kg (206 lb)  02/15/2016 1810 105/80 mmHg - - 119 - 91 % - -  1. General:  in No Acute distress 2. Psychological: Alert and  Oriented 3. Head/ENT:    Dry Mucous Membranes                          Head Non traumatic, neck supple                          Normal   Dentition 4. SKIN:  decreased Skin turgor,  Skin clean Dry and intact no rash 5. Heart: Regular rate and rhythm systolic  Murmur, Rub or gallop 6. Lungs: no wheezes some crackles throughout distant breath sounds at the bases diminished  7. Abdomen: Soft, non-tender, Non distended 8. Lower extremities: no clubbing, cyanosis, or edema 9. Neurologically Grossly intact, moving all 4 extremities equally 10. MSK: Normal range of motion   body mass index is 36.5 kg/(m^2).  Labs on Admission:   Labs on Admission: I have personally reviewed following labs and imaging studies  CBC:  Recent Labs Lab 02/05/16 1302 02/12/2016 2035  WBC 21.8* 6.7  NEUTROABS 21.0* 6.4  HGB 14.2 14.7  HCT 41.1 41.1  MCV 94.5 92.2  PLT 127* 798   Basic Metabolic Panel:  Recent Labs Lab 02/05/16 1302 02/17/2016 1903  NA 130* 131*  K 5.3 Sl. Hemolysis* 6.7*  CL  --  94*  CO2 22 23  GLUCOSE 467* 468*  BUN 27.5* 26*  CREATININE 1.0 0.81  CALCIUM 9.1 9.6   GFR: Estimated Creatinine Clearance: 97.3 mL/min (by C-G formula based on Cr of 0.81). Liver Function Tests:  Recent  Labs Lab 02/05/16 1302 02/08/2016 1903  AST 17 37  ALT 30 37  ALKPHOS 124 118  BILITOT 0.99 2.7*  PROT 6.5 6.4*  ALBUMIN 3.0* 3.0*   No results for input(s): LIPASE, AMYLASE in the last 168 hours. No results for input(s): AMMONIA in the last 168 hours. Coagulation Profile: No results for input(s): INR, PROTIME in the last 168 hours. Cardiac Enzymes: No results for input(s): CKTOTAL, CKMB, CKMBINDEX, TROPONINI in the last 168 hours. BNP (last 3 results) No results for input(s): PROBNP in the last 8760 hours. HbA1C: No results for input(s): HGBA1C in the last 72 hours. CBG: No results for input(s): GLUCAP in the last 168 hours. Lipid Profile: No results for input(s): CHOL, HDL, LDLCALC, TRIG, CHOLHDL, LDLDIRECT in the last 72 hours. Thyroid Function Tests: No results for input(s): TSH, T4TOTAL, FREET4, T3FREE, THYROIDAB in the last 72 hours. Anemia Panel: No results for input(s): VITAMINB12, FOLATE, FERRITIN, TIBC, IRON, RETICCTPCT in the last 72 hours. Urine analysis:    Component Value Date/Time   COLORURINE YELLOW 02/08/2016 2138   APPEARANCEUR CLEAR 02/07/2016 2138   LABSPEC >1.046* 02/12/2016 2138   PHURINE 6.5 02/18/2016 2138   GLUCOSEU >1000* 02/29/2016 2138   HGBUR TRACE* 03/06/2016 2138   BILIRUBINUR NEGATIVE 02/19/2016 2138   KETONESUR 40* 02/25/2016 2138   PROTEINUR NEGATIVE 02/17/2016 2138   UROBILINOGEN 0.2 11/29/2014 0507   NITRITE NEGATIVE 02/20/2016 2138   LEUKOCYTESUR NEGATIVE 02/10/2016 2138   Sepsis Labs: '@LABRCNTIP'$ (procalcitonin:4,lacticidven:4) )No results found for this or any previous visit (from the past 240 hour(s)).       UA   no evidence of UTI Glucose in urine  No results found for: HGBA1C  Estimated Creatinine Clearance: 97.3 mL/min (by C-G formula based on Cr of 0.81).  BNP (last 3 results) No results for input(s): PROBNP in the last 8760 hours.  ECG REPORT  Independently reviewed Rate:121  Rhythm: Sinus tachycardia ST&T  Change: No acute ischemic changes   QTC 401  Filed Weights   02/29/2016 1812  Weight: 93.441 kg (206 lb)     Cultures: No results found for: SDES, SPECREQUEST, CULT, REPTSTATUS   Radiological Exams on Admission: Dg Chest Port 1 View  02/26/2016  CLINICAL DATA:  Increasing weakness and fatigue over the past week. Recent diagnosis of lung cancer. EXAM: PORTABLE CHEST 1 VIEW COMPARISON:  01/31/2016, 01/20/2016. FINDINGS: Basilar opacities are significantly reduced compared to 01/20/2016, with mild residual patchy opacity in the medial right base. Bilateral hilar adenopathy again noted. Probable small left pleural effusion. Pulmonary vasculature is normal. IMPRESSION: Probable small left pleural effusion. Mild patchy opacity in the medial right base. Unchanged hilar adenopathy. Electronically Signed   By: Andreas Newport M.D.   On: 02/18/2016 19:27    Chart has been reviewed    Assessment/Plan  45 y.o. 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 HCAP (healthcare-associated pneumonia)   - chest x-ray worrisome for new infiltrate in the area that was prior affected by metastatic spread suspect postobstructive pneumonia. We'll treat with azreonam, vanc  and Flagyl for now obtain sputum cultures and blood culture. Will need repeat imaging to document resolution . SIRS/sepsis (systemic inflammatory response syndrome) (HCC) appreciate pulmonology consult help of wine placement as well as management Admit per Sepsis protocol likely source being pneumonia   - rehydrate with 48m/kg  - initiate broad spectrum antibiotics  Vancomycin and aztreonam and Flagyl  -  obtain blood cultures  - Obtain serial lactic acid  - Obtain procalcitonin level  -  Admit and monitor vital signs closely  -  PCCM has been consulted  Sepsis - Repeat Assessment  Performed at:    11:15  Vitals     Blood pressure 122/61, pulse 125, temperature 99.2 F (37.3 C), temperature source Oral, resp. rate 19, height '5\' 3"'$  (1.6 m), weight 93.441 kg (206 lb), SpO2 93 %.  Heart:     Regular rate and rhythm and Tachycardic  Lungs:    Distant breath sounds  Capillary Refill:   <2 sec  Peripheral Pulse:   Radial pulse palpable  Skin:     Normal Color    . Breast cancer of upper-outer quadrant of right female breast ([facility] - as per oncology will defer to them regarding overall plan of care  . Brain metastases ([facility] currently receiving radiation therapy denying a neurological complaints  . Hyponatremia will obtain urine electrolytes to keep fluids and follow, likely combination from dehydration and steroid use  . 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 Given significant hyperglycemia which is symptomatic will start glucose stabilizer and then transition to long-standing insulin    . History of pulmonary embolism   - anticoagulation has been discontinued by hematology oncology after 6 months defer to them regarding further treatment given patient overall risk factors and prognosis     Other plan as per orders.  DVT prophylaxis:    Lovenox     Code Status:  FULL CODE   as per patient    Family Communication:   Family not at  Bedside     Disposition Plan:  To home  once workup is complete and patient is stable   Consults called: emailed Oncology  Admission status:   inpatient       Level of care   SDU    I have spent a total of 24mn on this admission extra time was spent [facility] Addendum I was notified the patient has x-ray results after line placement has resulted in Epic upon further review was found that central line was advanced into IVC nursing staff [facility] these  results with plan to address.   DDupuyer5/02/2016, 11:40 PM   [facility]  Pager [phone]  after 2 AM please page floor coverage PA If 7AM-7PM, please contact the day team taking care of the patient  [email or web address]  Password [email or web address]

## 2016-02-09 NOTE — ED Provider Notes (Signed)
CSN: [id number]     Arrival date & time 03/05/2016  1808 History   First MD Initiated Contact with Patient 02/10/2016 1825     Chief Complaint  Patient presents with  . Weakness  . Cough  . Leg Swelling     (Consider location/radiation/quality/duration/timing/severity/associated sxs/prior Treatment) HPI Comments: Patient here complaining of increased shortness of breath as well as weakness 3 days. Patient has a history of metastatic breast cancer and is undergoing creation treatment to her brain which she did received today. States became more week after that. Has had not that cough without fever. No vomiting or diarrhea. The chest or abdominal discomfort. Notes increased dyspnea on exertion as well as lower extremity edema. Symptoms abated progressively worse and saw her doctor today and was sent here for further evaluation. No treatment given for this complaint prior to arrival  Patient is a 45 y.o. female presenting with weakness and cough. The history is provided by the patient and a friend.  Weakness  Cough   Past Medical History  Diagnosis Date  . Increased heart rate 11/2014    pt. put on metoprolol  . Pulmonary embolism ([place]) 11/2014  . DVT (deep venous thrombosis) ([place]) 11/2014    BUE  . Cancer of right breast ([place]) 08/2014    S/P chemo 09/2014-02/02/2015  . Heart murmur     pt. states a doctor stated she had a slight murmur, no studies done. Present PCP has never mention it to her.  . SVT (supraventricular tachycardia) [facility])    Past Surgical History  Procedure Laterality Date  . Mouth surgery      implants  . Portacath placement Right 08/31/2014    Procedure: INSERTION PORT-A-CATH WITH ULTRA SOUND GUIDENCE;  Surgeon: Erroll Luna, MD;  Location: [place];  Service: General;  Laterality: Right;  . Mastectomy complete / simple w/ sentinel node biopsy Right 04/12/2015  . Mastectomy complete / simple Left 04/12/2015    PROPHYLATIC  . Breast reconstruction with  placement of tissue expander and flex hd (acellular hydrated dermis) Bilateral 04/12/2015  . Port-a-cath removal  04/12/2015  . Breast biopsy Right 07/2014  . Simple mastectomy with axillary sentinel node biopsy Bilateral 04/12/2015    Procedure: BILATERAL TOTAL MASTECTOMY WITH RIGHT AXILLARY SENTINEL LYMPH  NODE BIOPSY, POSSIBLE AXILLARY DISSECTION, LEFT PROPHYLATIC;  Surgeon: Excell Seltzer, MD;  Location: [place];  Service: General;  Laterality: Bilateral;  . Port-a-cath removal Right 04/12/2015    Procedure: REMOVAL PORT-A-CATH;  Surgeon: Excell Seltzer, MD;  Location: [place];  Service: General;  Laterality: Right;  . Breast reconstruction with placement of tissue expander and flex hd (acellular hydrated dermis) Bilateral 04/12/2015    Procedure: IMMEDIATE BILATERAL BREAST RECONSTRUCTION WITH PLACEMENT OF TISSUE EXPANDER AND FLEX HD (ACELLULAR HYDRATED DERMIS);  Surgeon: Theodoro Kos, DO;  Location: [place];  Service: Plastics;  Laterality: Bilateral;   Family History  Problem Relation Age of Onset  . Breast cancer Mother 45  . Prostate cancer Father 51  . Esophageal cancer Paternal Uncle     smoker  . Cancer Cousin    Social History  Substance Use Topics  . Smoking status: Never Smoker   . Smokeless tobacco: Never Used  . Alcohol Use: No   OB History    No data available     Review of Systems  Respiratory: Positive for cough.   Neurological: Positive for weakness.  All other systems reviewed and are negative.     Allergies  Erythromycin base;  Penicillins; and Zithromax  Home Medications   Prior to Admission medications   Medication Sig Start Date End Date Taking? Authorizing Provider  acetaminophen (TYLENOL) 325 MG tablet Take 650 mg by mouth every 6 (six) hours as needed for moderate pain.    Historical Provider, MD  capecitabine (XELODA) 500 MG tablet Take 4 tablets (2,000 mg total) by mouth 2 (two) times daily after a meal. Take for 14 days then off for 1 week Every 21  days Patient not taking: Reported on 02/05/2016 02/02/16   Nicholas Lose, MD  dexamethasone (DECADRON) 4 MG tablet Take 1 tablet TID starting 4-18. Then on 5-2 taper to 1 tablet BID. 01/22/16   Eppie Gibson, MD  diphenhydrAMINE (BENADRYL) 25 mg capsule Take 25 mg by mouth every 6 (six) hours as needed for allergies.    Historical Provider, MD  fluconazole (DIFLUCAN) 100 MG tablet Take 2 tablets today, then 1 tablet daily x 20 more days. 02/05/16   Eppie Gibson, MD  fluticasone [facility]) 50 MCG/ACT nasal spray Place 1 spray into both nostrils daily as needed for allergies or rhinitis.    Historical Provider, MD  lidocaine-prilocaine (EMLA) cream Apply to affected area once 02/05/16   Nicholas Lose, MD  loratadine (CLARITIN) 10 MG tablet Take 10 mg by mouth daily as needed for allergies.    Historical Provider, MD  LORazepam (ATIVAN) 0.5 MG tablet Take 1 tablet (0.5 mg total) by mouth every 6 (six) hours as needed (Nausea or vomiting). 02/05/16   Nicholas Lose, MD  omeprazole (PRILOSEC) 20 MG capsule Take 1 capsule (20 mg total) by mouth daily. 01/29/16   Eppie Gibson, MD  ondansetron (ZOFRAN ODT) 8 MG disintegrating tablet 8mg  ODT q4 hours prn nausea 01/29/16   Eppie Gibson, MD  ondansetron (ZOFRAN) 8 MG tablet Take 1 tablet (8 mg total) by mouth 2 (two) times daily as needed for refractory nausea / vomiting. Start on day 3 after chemotherapy. 02/05/16   Nicholas Lose, MD  prochlorperazine (COMPAZINE) 10 MG tablet Take 1 tablet (10 mg total) by mouth every 6 (six) hours as needed (Nausea or vomiting). 02/05/16   Nicholas Lose, MD  UNABLE TO FIND 1 each by Other route as needed. Dispense Class 1 preventative compression sleeve 20-25mm/Hg for this patient with a hx of breast CA s/p surgery 11/03/15   Nicholas Lose, MD   BP 105/80 mmHg  Pulse 116  Temp(Src) 99.2 F (37.3 C) (Oral)  Resp 14  Ht 5\' 3"  (1.6 m)  Wt 93.441 kg  BMI 36.50 kg/m2  SpO2 94% Physical Exam  Constitutional: She is oriented to person, place, and  time. She appears well-developed and well-nourished.  Non-toxic appearance. No distress.  HENT:  Head: Normocephalic and atraumatic.  Eyes: Conjunctivae, EOM and lids are normal. Pupils are equal, round, and reactive to light.  Neck: Normal range of motion. Neck supple. No tracheal deviation present. No thyroid mass present.  Cardiovascular: Regular rhythm and normal heart sounds.  Tachycardia present.  Exam reveals no gallop.   No murmur heard. Pulmonary/Chest: Effort normal. No stridor. Tachypnea noted. No respiratory distress. She has decreased breath sounds in the right middle field and the right lower field. She has wheezes in the right middle field and the right lower field. She has no rhonchi. She has no rales.  Abdominal: Soft. Normal appearance and bowel sounds are normal. She exhibits no distension. There is no tenderness. There is no rebound and no CVA tenderness.  Musculoskeletal: Normal range of motion.  She exhibits no edema or tenderness.  Neurological: She is alert and oriented to person, place, and time. She has normal strength. No cranial nerve deficit or sensory deficit. GCS eye subscore is 4. GCS verbal subscore is 5. GCS motor subscore is 6.  Skin: Skin is warm and dry. No abrasion and no rash noted.  Psychiatric: She has a normal mood and affect. Her speech is normal and behavior is normal.  Nursing note and vitals reviewed.   ED Course  Procedures (including critical care time) Labs Review Labs Reviewed  CULTURE, BLOOD (ROUTINE X 2)  CULTURE, BLOOD (ROUTINE X 2)  URINE CULTURE  COMPREHENSIVE METABOLIC PANEL  CBC WITH DIFFERENTIAL/PLATELET  URINALYSIS, ROUTINE W REFLEX MICROSCOPIC (NOT AT [facility])  I-STAT CG4 LACTIC ACID, ED    Imaging Review No results found. I have personally reviewed and evaluated these images and lab results as part of my medical decision-making.   EKG Interpretation None      MDM   Final diagnoses:  None  Potassium noted and likely  hemolyzing will have this repeated.  Patient had IV access attempted by external jugular as well as ultrasound-guided which was unsuccessful. Critical care come to place a central line. She is mentating appropriately at this time. Delaying antibiotics is due to delay in obtaining IV access. Spoke with hospitalist and she will be admitted to their service  CRITICAL CARE Performed by: Leota Jacobsen Total critical care time: 45 minutes Critical care time was exclusive of separately billable procedures and treating other patients. Critical care was necessary to treat or prevent imminent or life-threatening deterioration. Critical care was time spent personally by me on the following activities: development of treatment plan with patient and/or surrogate as well as nursing, discussions with consultants, evaluation of patient's response to treatment, examination of patient, obtaining history from patient or surrogate, ordering and performing treatments and interventions, ordering and review of laboratory studies, ordering and review of radiographic studies, pulse oximetry and re-evaluation of patient's condition.    Lacretia Leigh, MD 03/04/2016 2226

## 2016-02-09 NOTE — Progress Notes (Signed)
At 6:10 pm Pt was escorted to ED per Dr. Pearlie Oyster request.  Pt has been experiencing increasing weakness and fatigue over the last week.  Report given to nurse Margarita Grizzle.

## 2016-02-09 NOTE — ED Notes (Signed)
IV team at bedside to get blood and IV.

## 2016-02-09 NOTE — ED Notes (Signed)
Patient states she has been taking whole brain radiation and with each treatment patient states she gets weaker. Patient has swelling in bilateral extremities and a non productive cough.

## 2016-02-09 NOTE — ED Notes (Signed)
MD Zenia Resides aware that IV blew and IV removed and warm compress applied to site.

## 2016-02-09 NOTE — Progress Notes (Signed)
   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  CBCT/MVCT images/Port film x-rays were reviewed.  The chart was checked. She is absolutely fatigued.   Drinking 4 cups of fluid daily.  Forcing herself to eat. New dry cough .   Bed sore, new.  LE edema.  On Decadron taper.  ? Hematuria.    Cannot stand for orthostatics on her own.   Physical Findings:  oral temperature is 97.7 F (36.5 C). Her blood pressure is 102/69 and her pulse is 97. Her respiration is 20.   Wt Readings from Last 3 Encounters:  02/05/16 206 lb 4.8 oz (93.577 kg)  02/05/16 204 lb 12.8 oz (92.897 kg)  01/29/16 210 lb 8 oz (95.482 kg)   NAD, resolved thrush in mouth.  Hyperpigmented forehead in RT field.  Ill appearing.  Cannot stand up on her own.  Couldn't stand to show bed sore.  Lungs CTAB. Heart RRR.  Impression:  The patient is extremely fatigued- more than usual during RT. Failure to thrive.  Cannot rule out infection.  Plan:  Sending to ED for workup. I called triage. ________________________________   Eppie Gibson, M.D.

## 2016-02-09 NOTE — Addendum Note (Signed)
Encounter addended by: Jenene Slicker, RN on: 02/07/2016  6:16 PM<BR>     Documentation filed: Notes Section

## 2016-02-09 NOTE — Progress Notes (Signed)
Weekly rad txs brain 12/14 completed, c/o very fatigued, no energy, blurry vision,taked deacdron taper, has thrush is on diflucan, sister states patient has an open sore on her bottom and asking MD to see ,they have a beni card  To get any rx's  Not using radiaplex \ BP 102/69 mmHg  Pulse 97  Temp(Src) 97.7 F (36.5 C) (Oral)  Resp 20  Wt Readings from Last 3 Encounters:  02/05/16 206 lb 4.8 oz (93.577 kg)  02/05/16 204 lb 12.8 oz (92.897 kg)  01/29/16 210 lb 8 oz (95.482 kg)

## 2016-02-09 NOTE — ED Provider Notes (Signed)
Angiocath insertion Performed by: Abigail Butts  Consent: Verbal consent obtained. Risks and benefits: risks, benefits and alternatives were discussed Time out: Immediately prior to procedure a "time out" was called to verify the correct patient, procedure, equipment, support staff and site/side marked as required.  Preparation: Patient was prepped and draped in the usual sterile fashion.  Vein Location: Left brachicephalic  Ultrasound Guided  Gauge: 18ga  Normal blood return and flush without difficulty Patient tolerance: Patient tolerated the procedure well with no immediate complications.     Jarrett Soho Meilin Brosh, PA-C 02/13/2016 2046  Lacretia Leigh, MD 02/18/16 [phone]

## 2016-02-09 NOTE — ED Notes (Signed)
[employer] procedure, Consent signed and witnessed and MD Signed.  Time out @ 2236.

## 2016-02-09 NOTE — ED Notes (Signed)
CCM notified via [place] (Dr Deterding notified), states she will notify Dr Ancil Linsey.

## 2016-02-09 NOTE — Procedures (Signed)
Central Venous Catheter Insertion Procedure Note Jamie Edwards [id number] 11/23/1970  Procedure: Insertion of Central Venous Catheter Indications: Assessment of intravascular volume, Drug and/or fluid administration and Frequent blood sampling  Procedure Details Consent: Risks of procedure as well as the alternatives and risks of each were explained to the (patient/caregiver).  Consent for procedure obtained. Time Out: Verified patient identification, verified procedure, site/side was marked, verified correct patient position, special equipment/implants available, medications/allergies/relevent history reviewed, required imaging and test results available.  Performed  Maximum sterile technique was used including antiseptics, cap, gloves, gown, hand hygiene, mask and sheet. Skin prep: Chlorhexidine; local anesthetic administered A antimicrobial bonded/coated triple lumen catheter was placed in the right internal jugular vein using the Seldinger technique.  Evaluation Blood flow good Complications: No apparent complications Patient did tolerate procedure well. Chest X-ray ordered to verify placement.  CXR: pending.  Jamie Edwards 02/15/2016, 10:58 PM

## 2016-02-09 NOTE — ED Notes (Signed)
Pt reports was over in radiology and just became very weak.   Pt has wet sounding cough per family she has had it this week but it has been dry until today.  Pt sts her fatigue started several days ago.  Pt reports Rt side breast CA with mets to lung and brain.   PT alert and oriented.  NAD.  ST on monitor.  Family at bedside.  Rica Mast (husband) [phone].  Sister Jonelle Sidle [place]) [phone], (Daughter) Aleene Davidson [phone].

## 2016-02-09 NOTE — ED Notes (Signed)
Requested CCM paged to give results of XR for [employer]

## 2016-02-10 ENCOUNTER — Inpatient Hospital Stay (HOSPITAL_COMMUNITY): Payer: Commercial Managed Care - HMO

## 2016-02-10 DIAGNOSIS — A419 Sepsis, unspecified organism: Principal | ICD-10-CM

## 2016-02-10 DIAGNOSIS — R739 Hyperglycemia, unspecified: Secondary | ICD-10-CM

## 2016-02-10 DIAGNOSIS — C7931 Secondary malignant neoplasm of brain: Secondary | ICD-10-CM

## 2016-02-10 DIAGNOSIS — R0600 Dyspnea, unspecified: Secondary | ICD-10-CM

## 2016-02-10 DIAGNOSIS — J189 Pneumonia, unspecified organism: Secondary | ICD-10-CM

## 2016-02-10 DIAGNOSIS — E871 Hypo-osmolality and hyponatremia: Secondary | ICD-10-CM

## 2016-02-10 DIAGNOSIS — C50411 Malignant neoplasm of upper-outer quadrant of right female breast: Secondary | ICD-10-CM

## 2016-02-10 LAB — STREP PNEUMONIAE URINARY ANTIGEN: Strep Pneumo Urinary Antigen: NEGATIVE

## 2016-02-10 LAB — CBC
HCT: 34.1 % — ABNORMAL LOW (ref 36.0–46.0)
Hemoglobin: 11.8 g/dL — ABNORMAL LOW (ref 12.0–15.0)
MCH: 32.2 pg (ref 26.0–34.0)
MCHC: 34.6 g/dL (ref 30.0–36.0)
MCV: 92.9 fL (ref 78.0–100.0)
Platelets: 99 10*3/uL — ABNORMAL LOW (ref 150–400)
RBC: 3.67 MIL/uL — ABNORMAL LOW (ref 3.87–5.11)
RDW: 20.1 % — AB (ref 11.5–15.5)
WBC: 3.7 10*3/uL — AB (ref 4.0–10.5)

## 2016-02-10 LAB — GLUCOSE, CAPILLARY
GLUCOSE-CAPILLARY: 142 mg/dL — AB (ref 65–99)
GLUCOSE-CAPILLARY: 156 mg/dL — AB (ref 65–99)
GLUCOSE-CAPILLARY: 159 mg/dL — AB (ref 65–99)
GLUCOSE-CAPILLARY: 161 mg/dL — AB (ref 65–99)
GLUCOSE-CAPILLARY: 192 mg/dL — AB (ref 65–99)
GLUCOSE-CAPILLARY: 203 mg/dL — AB (ref 65–99)
GLUCOSE-CAPILLARY: 405 mg/dL — AB (ref 65–99)
GLUCOSE-CAPILLARY: 449 mg/dL — AB (ref 65–99)
Glucose-Capillary: 331 mg/dL — ABNORMAL HIGH (ref 65–99)
Glucose-Capillary: 278 mg/dL — ABNORMAL HIGH (ref 65–99)
Glucose-Capillary: 202 mg/dL — ABNORMAL HIGH (ref 65–99)
Glucose-Capillary: 154 mg/dL — ABNORMAL HIGH (ref 65–99)
Glucose-Capillary: 242 mg/dL — ABNORMAL HIGH (ref 65–99)

## 2016-02-10 LAB — COMPREHENSIVE METABOLIC PANEL
ALT: 27 U/L (ref 14–54)
AST: 25 U/L (ref 15–41)
Albumin: 2.3 g/dL — ABNORMAL LOW (ref 3.5–5.0)
Alkaline Phosphatase: 85 U/L (ref 38–126)
Anion gap: 10 (ref 5–15)
BILIRUBIN TOTAL: 1.2 mg/dL (ref 0.3–1.2)
BUN: 24 mg/dL — AB (ref 6–20)
CO2: 23 mmol/L (ref 22–32)
CREATININE: 0.78 mg/dL (ref 0.44–1.00)
Calcium: 8.8 mg/dL — ABNORMAL LOW (ref 8.9–10.3)
Chloride: 100 mmol/L — ABNORMAL LOW (ref 101–111)
GFR calc Af Amer: >60 mL/min (ref >60)
Glucose, Bld: 283 mg/dL — ABNORMAL HIGH (ref 65–99)
POTASSIUM: 4.1 mmol/L (ref 3.5–5.1)
Sodium: 133 mmol/L — ABNORMAL LOW (ref 135–145)
TOTAL PROTEIN: 5.4 g/dL — AB (ref 6.5–8.1)

## 2016-02-10 LAB — BASIC METABOLIC PANEL
ANION GAP: 13 (ref 5–15)
BUN: 25 mg/dL — ABNORMAL HIGH (ref 6–20)
CALCIUM: 9.5 mg/dL (ref 8.9–10.3)
CHLORIDE: 97 mmol/L — AB (ref 101–111)
CO2: 25 mmol/L (ref 22–32)
Creatinine, Ser: 0.8 mg/dL (ref 0.44–1.00)
GFR calc non Af Amer: >60 mL/min (ref >60)
Glucose, Bld: 434 mg/dL — ABNORMAL HIGH (ref 65–99)
Potassium: 5.1 mmol/L (ref 3.5–5.1)
Sodium: 135 mmol/L (ref 135–145)

## 2016-02-10 LAB — LACTIC ACID, PLASMA
LACTIC ACID, VENOUS: 4.2 mmol/L — AB (ref 0.5–2.0)
Lactic Acid, Venous: 2.4 mmol/L (ref 0.5–2.0)
Lactic Acid, Venous: 2.3 mmol/L (ref 0.5–2.0)
Lactic Acid, Venous: 2.5 mmol/L (ref 0.5–2.0)

## 2016-02-10 LAB — BLOOD GAS, ARTERIAL
Acid-base deficit: 1.4 mmol/L (ref 0.0–2.0)
BICARBONATE: 21 meq/L (ref 20.0–24.0)
DRAWN BY: 103701
O2 Content: 6 L/min
O2 SAT: 83.3 %
Patient temperature: 98.6
TCO2: 18.8 mmol/L (ref 0–100)
pCO2 arterial: 29.1 mmHg — ABNORMAL LOW (ref 35.0–45.0)
pH, Arterial: 7.471 — ABNORMAL HIGH (ref 7.350–7.450)
pO2, Arterial: 47.6 mmHg — ABNORMAL LOW (ref 80.0–100.0)

## 2016-02-10 LAB — PROTIME-INR
INR: 1.66 — ABNORMAL HIGH (ref 0.00–1.49)
Prothrombin Time: 19 seconds — ABNORMAL HIGH (ref 11.6–15.2)

## 2016-02-10 LAB — ECHOCARDIOGRAM COMPLETE
Height: 68 in
Weight: 3132.3 oz

## 2016-02-10 LAB — PROCALCITONIN: Procalcitonin: 4.24 ng/mL

## 2016-02-10 LAB — APTT

## 2016-02-10 LAB — PHOSPHORUS: PHOSPHORUS: 1.7 mg/dL — AB (ref 2.5–4.6)

## 2016-02-10 LAB — SODIUM, URINE, RANDOM: SODIUM UR: 144 mmol/L

## 2016-02-10 LAB — TSH: TSH: 0.435 u[IU]/mL (ref 0.350–4.500)

## 2016-02-10 LAB — MAGNESIUM: Magnesium: 2.1 mg/dL (ref 1.7–2.4)

## 2016-02-10 LAB — MRSA PCR SCREENING: MRSA BY PCR: NEGATIVE

## 2016-02-10 LAB — CREATININE, URINE, RANDOM: Creatinine, Urine: 50.74 mg/dL

## 2016-02-10 MED ORDER — ONDANSETRON HCL 4 MG/2ML IJ SOLN: 4.0000 mg | Freq: Four times a day (QID) | INTRAMUSCULAR | Status: DC | PRN

## 2016-02-10 MED ORDER — FUROSEMIDE 10 MG/ML IJ SOLN
40.0000 mg | Freq: Once | INTRAMUSCULAR | Status: AC
  Administered 2016-02-10: 40 mg via INTRAVENOUS
  Filled 2016-02-10: qty 4

## 2016-02-10 MED ORDER — GUAIFENESIN ER 600 MG PO TB12
600.0000 mg | ORAL_TABLET | Freq: Two times a day (BID) | ORAL | Status: DC
  Administered 2016-02-10 (×2): 600 mg via ORAL
  Filled 2016-02-10 (×3): qty 1

## 2016-02-10 MED ORDER — METRONIDAZOLE IN NACL 5-0.79 MG/ML-% IV SOLN
500.0000 mg | Freq: Three times a day (TID) | INTRAVENOUS | Status: DC
  Administered 2016-02-10 – 2016-02-11 (×5): 500 mg via INTRAVENOUS
  Filled 2016-02-10 (×5): qty 100

## 2016-02-10 MED ORDER — SODIUM CHLORIDE 0.9 % IV SOLN
INTRAVENOUS | Status: DC
  Administered 2016-02-10: 02:00:00 via INTRAVENOUS

## 2016-02-10 MED ORDER — HYDROCODONE-ACETAMINOPHEN 5-325 MG PO TABS: 1.0000 | ORAL_TABLET | ORAL | Status: DC | PRN

## 2016-02-10 MED ORDER — SODIUM CHLORIDE 0.9 % IV SOLN
INTRAVENOUS | Status: AC
  Administered 2016-02-10: 3.5 [IU]/h via INTRAVENOUS
  Filled 2016-02-10: qty 2.5

## 2016-02-10 MED ORDER — LEVALBUTEROL HCL 0.63 MG/3ML IN NEBU: 0.6300 mg | INHALATION_SOLUTION | Freq: Four times a day (QID) | RESPIRATORY_TRACT | Status: DC | PRN

## 2016-02-10 MED ORDER — FLUCONAZOLE 100 MG PO TABS
100.0000 mg | ORAL_TABLET | Freq: Two times a day (BID) | ORAL | Status: DC
  Administered 2016-02-10 (×2): 100 mg via ORAL
  Filled 2016-02-10 (×3): qty 1

## 2016-02-10 MED ORDER — ONDANSETRON HCL 4 MG PO TABS: 4.0000 mg | ORAL_TABLET | Freq: Four times a day (QID) | ORAL | Status: DC | PRN

## 2016-02-10 MED ORDER — INSULIN ASPART 100 UNIT/ML ~~LOC~~ SOLN
0.0000 [IU] | Freq: Every day | SUBCUTANEOUS | Status: DC
  Administered 2016-02-10: 2 [IU] via SUBCUTANEOUS

## 2016-02-10 MED ORDER — ACETAMINOPHEN 650 MG RE SUPP
650.0000 mg | Freq: Four times a day (QID) | RECTAL | Status: DC | PRN
  Administered 2016-02-11: 650 mg via RECTAL
  Filled 2016-02-10: qty 1

## 2016-02-10 MED ORDER — DEXTROSE 5 % IV SOLN
2.0000 g | Freq: Once | INTRAVENOUS | Status: AC
  Administered 2016-02-10: 2 g via INTRAVENOUS
  Filled 2016-02-10: qty 2

## 2016-02-10 MED ORDER — ENOXAPARIN SODIUM 60 MG/0.6ML ~~LOC~~ SOLN
45.0000 mg | SUBCUTANEOUS | Status: DC
  Filled 2016-02-10: qty 0.6

## 2016-02-10 MED ORDER — DEXTROSE 50 % IV SOLN: 25.0000 mL | INTRAVENOUS | Status: DC | PRN

## 2016-02-10 MED ORDER — IPRATROPIUM BROMIDE 0.02 % IN SOLN: 0.5000 mg | Freq: Four times a day (QID) | RESPIRATORY_TRACT | Status: DC | PRN

## 2016-02-10 MED ORDER — VANCOMYCIN HCL IN DEXTROSE 1-5 GM/200ML-% IV SOLN
1000.0000 mg | Freq: Three times a day (TID) | INTRAVENOUS | Status: DC
  Administered 2016-02-10 – 2016-02-11 (×4): 1000 mg via INTRAVENOUS
  Filled 2016-02-10 (×4): qty 200

## 2016-02-10 MED ORDER — SACCHAROMYCES BOULARDII 250 MG PO CAPS
250.0000 mg | ORAL_CAPSULE | Freq: Two times a day (BID) | ORAL | Status: DC
  Administered 2016-02-10: 250 mg via ORAL
  Filled 2016-02-10 (×3): qty 1

## 2016-02-10 MED ORDER — VANCOMYCIN HCL IN DEXTROSE 1-5 GM/200ML-% IV SOLN
1000.0000 mg | Freq: Once | INTRAVENOUS | Status: AC
  Administered 2016-02-10: 1000 mg via INTRAVENOUS
  Filled 2016-02-10: qty 200

## 2016-02-10 MED ORDER — GUAIFENESIN-DM 100-10 MG/5ML PO SYRP
5.0000 mL | ORAL_SOLUTION | ORAL | Status: DC | PRN
  Administered 2016-02-10: 5 mL via ORAL
  Filled 2016-02-10: qty 10

## 2016-02-10 MED ORDER — INSULIN REGULAR BOLUS VIA INFUSION
0.0000 [IU] | Freq: Three times a day (TID) | INTRAVENOUS | Status: DC
  Filled 2016-02-10: qty 10

## 2016-02-10 MED ORDER — AZTREONAM 2 G IJ SOLR
2.0000 g | Freq: Three times a day (TID) | INTRAMUSCULAR | Status: DC
  Administered 2016-02-10 – 2016-02-11 (×4): 2 g via INTRAVENOUS
  Filled 2016-02-10 (×5): qty 2

## 2016-02-10 MED ORDER — INSULIN GLARGINE 100 UNIT/ML ~~LOC~~ SOLN
10.0000 [IU] | Freq: Every day | SUBCUTANEOUS | Status: DC
  Administered 2016-02-10 – 2016-02-11 (×2): 10 [IU] via SUBCUTANEOUS
  Filled 2016-02-10 (×2): qty 0.1

## 2016-02-10 MED ORDER — IPRATROPIUM BROMIDE 0.02 % IN SOLN
0.5000 mg | Freq: Four times a day (QID) | RESPIRATORY_TRACT | Status: DC
  Filled 2016-02-10: qty 2.5

## 2016-02-10 MED ORDER — DEXTROSE-NACL 5-0.45 % IV SOLN
INTRAVENOUS | Status: AC
  Administered 2016-02-10: 13:00:00 via INTRAVENOUS

## 2016-02-10 MED ORDER — DEXAMETHASONE 2 MG PO TABS
4.0000 mg | ORAL_TABLET | Freq: Two times a day (BID) | ORAL | Status: DC
  Administered 2016-02-10: 4 mg via ORAL
  Filled 2016-02-10 (×3): qty 2

## 2016-02-10 MED ORDER — SODIUM CHLORIDE 0.9 % IV BOLUS (SEPSIS)
1000.0000 mL | Freq: Once | INTRAVENOUS | Status: AC
  Administered 2016-02-10: 1000 mL via INTRAVENOUS

## 2016-02-10 MED ORDER — SODIUM CHLORIDE 0.9 % IV BOLUS (SEPSIS)
250.0000 mL | Freq: Once | INTRAVENOUS | Status: AC
Start: 2016-02-10 — End: 2016-02-10
  Administered 2016-02-10: 250 mL via INTRAVENOUS

## 2016-02-10 MED ORDER — INSULIN ASPART 100 UNIT/ML ~~LOC~~ SOLN
0.0000 [IU] | Freq: Three times a day (TID) | SUBCUTANEOUS | Status: DC
  Administered 2016-02-10 – 2016-02-11 (×2): 5 [IU] via SUBCUTANEOUS
  Administered 2016-02-11: 11 [IU] via SUBCUTANEOUS

## 2016-02-10 MED ORDER — ACETAMINOPHEN 325 MG PO TABS
650.0000 mg | ORAL_TABLET | Freq: Four times a day (QID) | ORAL | Status: DC | PRN
  Administered 2016-02-10: 650 mg via ORAL
  Filled 2016-02-10: qty 2

## 2016-02-10 MED ORDER — SODIUM CHLORIDE 0.9 % IV BOLUS (SEPSIS)
500.0000 mL | Freq: Once | INTRAVENOUS | Status: AC
  Administered 2016-02-10: 500 mL via INTRAVENOUS

## 2016-02-10 MED ORDER — DEXAMETHASONE 2 MG PO TABS
4.0000 mg | ORAL_TABLET | Freq: Two times a day (BID) | ORAL | Status: DC
Start: 2016-02-10 — End: 2016-02-10
  Filled 2016-02-10: qty 2

## 2016-02-10 MED ORDER — SODIUM CHLORIDE 0.9% FLUSH
3.0000 mL | Freq: Two times a day (BID) | INTRAVENOUS | Status: DC
  Administered 2016-02-10 – 2016-02-11 (×3): 3 mL via INTRAVENOUS

## 2016-02-10 NOTE — Progress Notes (Signed)
Informed Dr. Maryland Pink that 4 RNs attempted to get PIV without being successful.  Dr. [place] Pink said to get VQ scan if unable to get PIV.  Irven Baltimore, RN

## 2016-02-10 NOTE — Consult Note (Signed)
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 Undergoing whole repeat palliative brain radiation therapy since 01/26/16. Also note plan for possible repeat chemo based on Oncology note from 5/1. She has had very poor PO intake, has been weak. Experienced oral thrush. Was seen by Dr Isidore Moos on 5/5 and was sent to the ED given her degree of fatigue and weakness. Pt also describes new dry cough. CXR 5/5 showed possible new RLL infiltrate, no real change in L pleural disease or known nodules. Started on HCAP abx coverage. She was not in shock but lactate 3.85 > 2.4 > 4.2.   PAST MEDICAL HISTORY :   has a past medical history of Increased heart rate (11/2014); Pulmonary embolism ([place]) (11/2014); DVT (deep venous thrombosis) ([place]) (11/2014); Cancer of right breast ([place]) (08/2014); Heart murmur; and SVT (supraventricular tachycardia) ([place]).  has past surgical history that includes Mouth surgery; Portacath placement (Right, 08/31/2014); Mastectomy complete / simple w/ sentinel node biopsy (Right, 04/12/2015); Mastectomy complete / simple (Left, 04/12/2015); Breast reconstruction with placement of tissue expander and flex hd (acellular hydrated dermis) (Bilateral, 04/12/2015); Port-a-cath removal (04/12/2015); Breast biopsy (Right, 07/2014); Simple mastectomy with axillary sentinel node biopsy (Bilateral, 04/12/2015); Port-a-cath removal (Right, 04/12/2015); and Breast reconstruction with placement of tissue expander and flex hd (acellular hydrated dermis) (Bilateral, 04/12/2015). Prior to Admission medications   Medication Sig Start Date End Date Taking? Authorizing Provider  dexamethasone (DECADRON) 4 MG tablet Take 1 tablet TID  starting 4-18. Then on 5-2 taper to 1 tablet BID. Patient taking differently: Take 4 mg by mouth 2 (two) times daily. Take 1 tablet TID starting 4-18. Then on 5-2 taper to 1 tablet BID. 01/22/16  Yes Eppie Gibson, MD  fluconazole (DIFLUCAN) 100 MG tablet Take 2 tablets today, then 1 tablet daily x 20 more days. Patient taking differently: Take 100 mg by mouth 2 (two) times daily. Take 2 tablets today, then 1 tablet daily x 20 more days. 02/05/16  Yes Eppie Gibson, MD  ondansetron (ZOFRAN) 8 MG tablet Take 1 tablet (8 mg total) by mouth 2 (two) times daily as needed for refractory nausea / vomiting. Start on day 3 after chemotherapy. 02/05/16  Yes Nicholas Lose, MD  prochlorperazine (COMPAZINE) 10 MG tablet Take 1 tablet (10 mg total) by mouth every 6 (six) hours as needed (Nausea or vomiting). 02/05/16  Yes Nicholas Lose, MD  capecitabine (XELODA) 500 MG tablet Take 4 tablets (2,000 mg total) by mouth 2 (two) times daily after a meal. Take for 14 days then off for 1 week Every 21 days Patient not taking: Reported on 02/05/2016 02/02/16   Nicholas Lose, MD  lidocaine-prilocaine (EMLA) cream Apply to affected area once Patient not taking: Reported on 02/05/2016 02/05/16   Nicholas Lose, MD  LORazepam (ATIVAN) 0.5 MG tablet Take 1 tablet (0.5 mg total) by mouth every 6 (six) hours as needed (Nausea or vomiting). Patient not taking: Reported on 02/14/2016 02/05/16   Nicholas Lose, MD  omeprazole (PRILOSEC) 20 MG capsule Take 1 capsule (20 mg total) by mouth daily. Patient not taking: Reported on 02/06/2016 01/29/16   Eppie Gibson, MD  ondansetron (ZOFRAN ODT) 8 MG disintegrating tablet 8mg  ODT q4 hours  prn nausea Patient not taking: Reported on 02/23/2016 01/29/16   Eppie Gibson, MD  UNABLE TO FIND 1 each by Other route as needed. Dispense Class 1 preventative compression sleeve 20-63mm/Hg for this patient with a hx of breast CA s/p surgery 11/03/15   Nicholas Lose, MD   Allergies  Allergen Reactions  . Erythromycin Base      Other reaction(s): GI Upset (intolerance)  . Penicillins Other (See Comments)    Has patient had a PCN reaction causing immediate rash, facial/tongue/throat swelling, SOB or lightheadedness with hypotension:No Has patient had a PCN reaction causing severe rash involving mucus membranes or skin necrosis:No Has patient had a PCN reaction that required hospitalization:No Has patient had a PCN reaction occurring within the last 10 years:No If all of the above answers are "NO", then may proceed with Cephalosporin use. Stomach upset  . Zithromax [Azithromycin] Diarrhea    FAMILY HISTORY:  family history includes Breast cancer (age of onset: 15) in her mother; Cancer in her cousin; Esophageal cancer in her paternal uncle; Prostate cancer (age of onset: 57) in her father. SOCIAL HISTORY:  reports that she has never smoked. She has never used smokeless tobacco. She reports that she does not drink alcohol or use illicit drugs.  REVIEW OF SYSTEMS:   Constitutional: Negative for fever, chills, weight loss, malaise/fatigue and diaphoresis.  HENT: Negative for hearing loss, ear pain, nosebleeds, congestion, sore throat, neck pain, tinnitus and ear discharge.   Eyes: Negative for blurred vision, double vision, photophobia, pain, discharge and redness.  Respiratory: Negative for cough, hemoptysis, sputum production, shortness of breath, wheezing and stridor.   Cardiovascular: Negative for chest pain, palpitations, orthopnea, claudication, leg swelling and PND.  Gastrointestinal: Negative for heartburn, nausea, vomiting, abdominal pain, diarrhea, constipation, blood in stool and melena.  Genitourinary: Negative for dysuria, urgency, frequency, hematuria and flank pain.  Musculoskeletal: Negative for myalgias, back pain, joint pain and falls.  Skin: Negative for itching and rash.  Neurological: Negative for dizziness, tingling, tremors, sensory change, speech change, focal weakness, seizures, loss of  consciousness, weakness and headaches.  Endo/Heme/Allergies: Negative for environmental allergies and polydipsia. Does not bruise/bleed easily.  SUBJECTIVE:  States that she feels better Remains relatively hypotensive SBP 90   VITAL SIGNS: Temp:  [97.7 F (36.5 C)-99.2 F (37.3 C)] 98.6 F (37 C) (05/06 0800) Pulse Rate:  [97-130] 109 (05/06 0600) Resp:  [14-36] 35 (05/06 0600) BP: (97-166)/(24-93) 105/56 mmHg (05/06 0600) SpO2:  [90 %-99 %] 92 % (05/06 0600) Weight:  [88.8 kg (195 lb 12.3 oz)-93.441 kg (206 lb)] 88.8 kg (195 lb 12.3 oz) (05/06 0100)  PHYSICAL EXAMINATION: General:  Chronically Ill appearing, weak Neuro:  Awake and alert, globally weak but non focal HEENT:  Op clear, no thrush seen Cardiovascular:  Tachycardic and regular, no M Lungs:  Small breaths, no wheeze or crackles,  Abdomen:  Soft, benign Musculoskeletal:  No LE edema Skin:  No rash   Recent Labs Lab 02/21/2016 1903 02/10/16 0120 02/10/16 0500  NA 131* 135 133*  K 6.7* 5.1 4.1  CL 94* 97* 100*  CO2 23 25 23   BUN 26* 25* 24*  CREATININE 0.81 0.80 0.78  GLUCOSE 468* 434* 283*    Recent Labs Lab 02/05/16 1302 02/08/2016 2035 02/10/16 0500  HGB 14.2 14.7 11.8*  HCT 41.1 41.1 34.1*  WBC 21.8* 6.7 3.7*  PLT 127* 154 99*   Dg Chest Port 1 View  02/10/2016  CLINICAL DATA:  Central line adjustment EXAM: PORTABLE CHEST  1 VIEW COMPARISON:  02/25/2016 at 23:10 FINDINGS: The right jugular central line extends into the low SVC. No pneumothorax. No other significant interval change. IMPRESSION: Right jugular central line.  No pneumothorax. Electronically Signed   By: Andreas Newport M.D.   On: 02/10/2016 00:25   Dg Chest Port 1 View  03/05/2016  CLINICAL DATA:  Central line placement EXAM: PORTABLE CHEST 1 VIEW COMPARISON:  02/26/2016 at 19:04 FINDINGS: There is a new right jugular central line, extending through the right heart chambers and down into the inferior vena cava. Withdrawing approximately 12  cm should result in tip residing in the region of the superior cavoatrial junction. No pneumothorax. Mild patchy right base opacity and bilateral hilar adenopathy again evident. IMPRESSION: The new right jugular central line extends beyond the right heart chambers and on into the IVC. Recommend withdrawing for optimal placement, approximately 12-14 cm. These results will be called to the ordering clinician or representative by the Radiologist Assistant, and communication documented in the PACS or zVision Dashboard. Electronically Signed   By: Andreas Newport M.D.   On: 02/07/2016 23:25   Dg Chest Port 1 View  02/13/2016  CLINICAL DATA:  Increasing weakness and fatigue over the past week. Recent diagnosis of lung cancer. EXAM: PORTABLE CHEST 1 VIEW COMPARISON:  01/31/2016, 01/20/2016. FINDINGS: Basilar opacities are significantly reduced compared to 01/20/2016, with mild residual patchy opacity in the medial right base. Bilateral hilar adenopathy again noted. Probable small left pleural effusion. Pulmonary vasculature is normal. IMPRESSION: Probable small left pleural effusion. Mild patchy opacity in the medial right base. Unchanged hilar adenopathy. Electronically Signed   By: Andreas Newport M.D.   On: 02/24/2016 19:27    ASSESSMENT / PLAN:  Metastatic breast CA to brain and chest Suspected RLL HCAP Known L pleural involvement of her malignancy > no current evidence for evolution of this Elevated lactate with a borderline elevated AG. Suspect due to hypovolemia, possibly early signs of sepsis. Increased WOB could cause this. Do not see any medications that would be typical culprits to cause lactate elevation.  Hypotension, probable hypovolemia + sepsis Hyperglycemia, probably due to dexamethasone   Recs:  - agree with broad abx coverage for possible HCAP; plan narrow as clinical course unfolds - would repeat lactate and follow for clearance - IVF resuscitation as her PO intake is poor, check  CVP qd - follow CXR and probable RLL infiltrate.  - May also consider repeat Ct chest as we go forward, especially if we believe her cough, dyspnea is more related to her existing malignancy, L effusion than to an acute infection - would wean insulin gtt to off as able, per protocol. Do not see evidence for DKA  Independent CC time 50 minutes   [name], MD, PhD 02/10/2016, 11:20 AM [facility] [phone] or if no answer [phone]

## 2016-02-10 NOTE — Progress Notes (Signed)
MD ELink called about patient temp of 101.8 degrees F and to request foley due to large incontinent episodes and skin breakdown.

## 2016-02-10 NOTE — Progress Notes (Signed)
Informed Dr. Maryland Pink about pt  Being tachypniac 35-40 and hypoxiac 86-90.  See orders.  Irven Baltimore, RN

## 2016-02-10 NOTE — Progress Notes (Signed)
Echocardiogram 2D Echocardiogram has been performed.  Tresa Res 02/10/2016, 10:54 AM

## 2016-02-10 NOTE — Progress Notes (Signed)
Pharmacy Antibiotic Note  Jamie Edwards is a 45 y.o. female admitted on 02/19/2016 with pneumonia.  PMH includes stage IV breast cancer with brain mets currently undergoing radiation.  Underwent radiation on 5/5 and experience increased weakness after treatment and was brought to ED.  Patient being admitted for sepsis with pneumonia being the suspected source.  Pharmacy has been consulted for Vancomycin & Aztreonam dosing.  MD has also ordered Flagyl.  Plan: Vancomycin 1gm IV every 8 hours.  Goal trough 15-20 mcg/mL.  Aztreonam 2gm IV q8h Flagyl per MD F/U cultures/sensitivities  Height: 5\' 3"  (160 cm) Weight: 206 lb (93.441 kg) IBW/kg (Calculated) : 52.4  Temp (24hrs), Avg:98.5 F (36.9 C), Min:97.7 F (36.5 C), Max:99.2 F (37.3 C)   Recent Labs Lab 02/05/16 1302 02/05/16 1302 02/22/2016 1903 02/17/2016 1906 02/08/2016 2035  WBC 21.8*  --   --   --  6.7  CREATININE  --  1.0 0.81  --   --   LATICACIDVEN  --   --   --  3.85*  --     Estimated Creatinine Clearance: 97.3 mL/min (by C-G formula based on Cr of 0.81).    Allergies  Allergen Reactions  . Erythromycin Base     Other reaction(s): GI Upset (intolerance)  . Penicillins Other (See Comments)    Has patient had a PCN reaction causing immediate rash, facial/tongue/throat swelling, SOB or lightheadedness with hypotension:No Has patient had a PCN reaction causing severe rash involving mucus membranes or skin necrosis:No Has patient had a PCN reaction that required hospitalization:No Has patient had a PCN reaction occurring within the last 10 years:No If all of the above answers are "NO", then may proceed with Cephalosporin use. Stomach upset  . Zithromax [Azithromycin] Diarrhea    Antimicrobials this admission: 5/6 Vanc >>   5/6 Aztreonam >>   5/6 Metronidazole >>  Dose adjustments this admission:    Microbiology results: 5/6 BCx:   5/6 UCx:     Thank you for allowing pharmacy to be a part of this patient's  care.  Everette Rank, PharmD 02/10/2016 1:13 AM

## 2016-02-10 NOTE — Progress Notes (Addendum)
[facility] PROGRESS NOTE  ZOELY GILBREATH [id number] DOB: [birth year] DOA: 02/15/2016  PCP: Osborne Casco, MD  Brief HPI: 45 year old African-American female with a past medical history of stage IV breast cancer with metastases to brain, undergoing radiation treatment for her brain lesions. She presented with fatigue, poor appetite, increased urination, thirst. She was found to be tachycardic and hypotensive. She was found to be hyperglycemic. She was hospitalized for further management. There was also concern for sepsis due to elevated lactic acid level and cough. She was started on antibiotics for presumed pneumonia.  Past medical history:  Past Medical History  Diagnosis Date  . Increased heart rate 11/2014    pt. put on metoprolol  . Pulmonary embolism ([place]) 11/2014  . DVT (deep venous thrombosis) ([place]) 11/2014    BUE  . Cancer of right breast ([place]) 08/2014    S/P chemo 09/2014-02/02/2015  . Heart murmur     pt. states a doctor stated she had a slight murmur, no studies done. Present PCP has never mention it to her.  . SVT (supraventricular tachycardia) [facility])     Consultants: Pulmonology.  Procedures:  Transthoracic echocardiogram is pending  Central line placement by critical care medicine  Antibiotics: Vancomycin, aztreonam and metronidazole 5/5  Subjective: Patient feels better today compared to yesterday. Still feels weak and fatigued. Continues to have a cough with clear expectoration. Denies any chest pain, shortness of breath. Denies any nausea or vomiting. No diarrhea.  Objective:  Vital Signs  Filed Vitals:   02/10/16 0400 02/10/16 0500 02/10/16 0600 02/10/16 0800  BP: 101/49 107/46 105/56   Pulse: 120 115 109   Temp: 99 F (37.2 C)   98.6 F (37 C)  TempSrc: Oral   Oral  Resp: 32 27 35   Height:      Weight:      SpO2: 90% 93% 92%     Intake/Output Summary (Last 24 hours) at 02/10/16 1057 Last data filed at 02/10/16 0600   Gross per 24 hour  Intake 2872.97 ml  Output      0 ml  Net 2872.97 ml   Filed Weights   02/08/2016 1812 02/10/16 0100  Weight: 93.441 kg (206 lb) 88.8 kg (195 lb 12.3 oz)    General appearance: alert, cooperative, appears stated age and no distress Resp: Diminished air entry at the right base with a few crackles. No wheezing, no rhonchi. Cardio: S1, S2 is tachycardic. Regular. No S3, S4. No rubs, murmurs, or bruit. No pedal edema. GI: soft, non-tender; bowel sounds normal; no masses,  no organomegaly Extremities: extremities normal, atraumatic, no cyanosis or edema Neurologic: Awake and alert. Oriented 3. No obvious focal neurological deficits.  Lab Results:  Data Reviewed: I have personally reviewed following labs and imaging studies  CBC:  Recent Labs Lab 02/05/16 1302 02/23/2016 2035 02/10/16 0500  WBC 21.8* 6.7 3.7*  NEUTROABS 21.0* 6.4  --   HGB 14.2 14.7 11.8*  HCT 41.1 41.1 34.1*  MCV 94.5 92.2 92.9  PLT 127* 154 99*   Basic Metabolic Panel:  Recent Labs Lab 02/05/16 1302 03/01/2016 1903 02/10/16 0120 02/10/16 0500  NA 130* 131* 135 133*  K 5.3 Sl. Hemolysis* 6.7* 5.1 4.1  CL  --  94* 97* 100*  CO2 22 23 25 23   GLUCOSE 467* 468* 434* 283*  BUN 27.5* 26* 25* 24*  CREATININE 1.0 0.81 0.80 0.78  CALCIUM 9.1 9.6 9.5 8.8*  MG  --   --   --  2.1  PHOS  --   --   --  1.7*   GFR: Estimated Creatinine Clearance: 105.8 mL/min (by C-G formula based on Cr of 0.78).  Liver Function Tests:  Recent Labs Lab 02/05/16 1302 03/06/2016 1903 02/10/16 0500  AST 17 37 25  ALT 30 37 27  ALKPHOS 124 118 85  BILITOT 0.99 2.7* 1.2  PROT 6.5 6.4* 5.4*  ALBUMIN 3.0* 3.0* 2.3*   Coagulation Profile:  Recent Labs Lab 02/10/16 0120  INR 1.66*   CBG:  Recent Labs Lab 02/10/16 0518 02/10/16 0624 02/10/16 0735 02/10/16 0855 02/10/16 1007  GLUCAP 278* 202* 192* 142* 159*   Thyroid Function Tests:  Recent Labs  02/10/16 0500  TSH 0.435   Urine analysis:     Component Value Date/Time   COLORURINE YELLOW 02/18/2016 2138   APPEARANCEUR CLEAR 02/22/2016 2138   LABSPEC >1.046* 02/08/2016 2138   PHURINE 6.5 03/05/2016 2138   GLUCOSEU >1000* 02/25/2016 2138   HGBUR TRACE* 02/25/2016 2138   BILIRUBINUR NEGATIVE 02/08/2016 2138   KETONESUR 40* 02/13/2016 2138   PROTEINUR NEGATIVE 02/10/2016 2138   UROBILINOGEN 0.2 11/29/2014 0507   NITRITE NEGATIVE 02/28/2016 2138   LEUKOCYTESUR NEGATIVE 02/15/2016 2138    Recent Results (from the past 240 hour(s))  MRSA PCR Screening     Status: None   Collection Time: 02/10/16  1:58 AM  Result Value Ref Range Status   MRSA by PCR NEGATIVE NEGATIVE Final    Comment:        The GeneXpert MRSA Assay (FDA approved for NASAL specimens only), is one component of a comprehensive MRSA colonization surveillance program. It is not intended to diagnose MRSA infection nor to guide or monitor treatment for MRSA infections.       Radiology Studies: Dg Chest Port 1 View  02/10/2016  CLINICAL DATA:  Central line adjustment EXAM: PORTABLE CHEST 1 VIEW COMPARISON:  02/07/2016 at 23:10 FINDINGS: The right jugular central line extends into the low SVC. No pneumothorax. No other significant interval change. IMPRESSION: Right jugular central line.  No pneumothorax. Electronically Signed   By: Andreas Newport M.D.   On: 02/10/2016 00:25   Dg Chest Port 1 View  02/28/2016  CLINICAL DATA:  Central line placement EXAM: PORTABLE CHEST 1 VIEW COMPARISON:  02/10/2016 at 19:04 FINDINGS: There is a new right jugular central line, extending through the right heart chambers and down into the inferior vena cava. Withdrawing approximately 12 cm should result in tip residing in the region of the superior cavoatrial junction. No pneumothorax. Mild patchy right base opacity and bilateral hilar adenopathy again evident. IMPRESSION: The new right jugular central line extends beyond the right heart chambers and on into the IVC. Recommend  withdrawing for optimal placement, approximately 12-14 cm. These results will be called to the ordering clinician or representative by the Radiologist Assistant, and communication documented in the PACS or zVision Dashboard. Electronically Signed   By: Andreas Newport M.D.   On: 02/21/2016 23:25   Dg Chest Port 1 View  02/05/2016  CLINICAL DATA:  Increasing weakness and fatigue over the past week. Recent diagnosis of lung cancer. EXAM: PORTABLE CHEST 1 VIEW COMPARISON:  01/31/2016, 01/20/2016. FINDINGS: Basilar opacities are significantly reduced compared to 01/20/2016, with mild residual patchy opacity in the medial right base. Bilateral hilar adenopathy again noted. Probable small left pleural effusion. Pulmonary vasculature is normal. IMPRESSION: Probable small left pleural effusion. Mild patchy opacity in the medial right base. Unchanged hilar adenopathy. Electronically Signed  By: Andreas Newport M.D.   On: 02/07/2016 19:27     Medications:  Scheduled: . aztreonam  2 g Intravenous Q8H  . dexamethasone  4 mg Oral BID  . fluconazole  100 mg Oral BID  . guaiFENesin  600 mg Oral BID  . insulin regular  0-10 Units Intravenous TID WC  . metronidazole  500 mg Intravenous Q8H  . sodium chloride flush  3 mL Intravenous Q12H  . vancomycin  1,000 mg Intravenous Q8H   Continuous: . sodium chloride 125 mL/hr at 02/10/16 0200  . insulin (NOVOLIN-R) infusion 3.3 Units/hr (02/10/16 0858)   [id number] **OR** acetaminophen, dextrose, guaiFENesin-dextromethorphan, HYDROcodone-acetaminophen, ipratropium, levalbuterol, ondansetron **OR** ondansetron (ZOFRAN) IV  Assessment/Plan:  Active Problems:   Breast cancer of upper-outer quadrant of right female breast (HCC)   Brain metastases (HCC)   Hyponatremia   Hyperglycemia   HCAP (healthcare-associated pneumonia)   SIRS (systemic inflammatory response syndrome) ([place])   History of pulmonary embolism   Sepsis ([place])    Sepsis Possible  source is pneumonia. Patient does have a cough and there was nonspecific finding on chest x-ray. Blood cultures are pending. Lactic acid level was elevated this morning at greater than 4. Fluid bolus was ordered. Repeat lactic acid levels are pending. Critical care medicine has been consulted. Continue broad-spectrum antibiotics for now. Follow-up on culture reports. She remains tachycardic, but her blood pressure stable.  Pneumonia, likely healthcare associated Aspiration is another possibility. Continue broad-spectrum antibiotics as mentioned above. Follow-up on culture data. ProCalcitonin level was significantly elevated.  Stage IV breast cancer with brain metastases. Followed by oncology as outpatient. Plan is to initiate chemotherapy later this month. Port-A-Cath to be placed prior to that. Patient is receiving radiation treatment to her brain lesions. She is also on dexamethasone for the same, which will be continued.  Hyperglycemia. 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 She is currently on an IV insulin infusion. Anticipate transition to long-acting insulin later today. HbA1c is pending.  Previous history of pulmonary embolism Patient has completed course of anticoagulation. Followed by oncology.  Pancytopenia Low WBC and platelet counts noted. Check differential. Stop heparin products. No evidence for bleeding. Her platelet counts could be due to sepsis. Repeat tomorrow morning.  Hyponatremia Most likely due to hypovolemia. Continue IV fluids. Monitor sodium closely.  ADDENDUM Informed by RN that patient requiring more O2. Sats around 88-92%. Patient seen and examined. She states she is feeling better. Denies chest pain. Does admit to feeling more short of breath. Lungs with few crackles at bases. No wheezing is heard. HR in 130's. Will get CXR and ABG to elicit reason for dyspnea/hypoxia.   CXR doesn't reveal any obvious reason for her hypoxia. Patient with h/o PE  and so she could have had another episode. Will proceed with CTA Chest. Discussed with patient about her guarded prognosis. She wishes to remain full code. Discussed also risk of bleeding if there is need for anticoagulation though this is not high with mets due to Breast Ca. Also discussed with on-call Oncologist, Dr. Julien Nordmann. He agrees with starting anticoagulation if PE is detected.  ABG results reviewed. Increase Fio2 to NRB for now.  Unfortunately an IV access could not be established for the CT. So a VQ scan has been ordered.  Official report of CXR suggests some pulmonary edema. However the degree of hypoxia is out of proportion to the findings of CXR. Plus ECHO done earlier today suggests hypovolemia based on IVC size. Will await  VQ for now.  DVT Prophylaxis: SCDs    Code Status: Full code  Family Communication: Discussed with the patient  Disposition Plan: Continue management as outlined above. Await clinical improvement. Follow-up lactic acid levels.    LOS: 1 day   [facility] Pager [phone] 02/10/2016, 10:57 AM  If 7PM-7AM, please contact night-coverage at [email or web address], password [facility]

## 2016-02-10 NOTE — Progress Notes (Signed)
CVC withdrawn about 12 cm to 15 cm. I was unaware during the procedure thecatheter is a 30 cm length. As such, a great deal of the catheter is outside the body, and should be removed / replaced with a more permanent line as soon as reasonably possible.  Luz Brazen, MD Pulmonary & Critical Care Medicine Feb 10, 2016, 12:39 AM

## 2016-02-10 NOTE — Progress Notes (Signed)
eLink Physician-Brief Progress Note Patient Name: Jamie Edwards DOB: 11-18-1970 MRN: [id number]   Date of Service  02/10/2016  HPI/Events of Note  Multiple issues: 1. Altered LOC and 2. Sat = 86%. Blood glucose = 240. CXR earlier read a pulmonary congestion. VQ Scan read as low probability.  eICU Interventions  Willl order: 1. ABG now.  2. Lasix 40 mg IV now.  3. BiPAP - IPAP 12/EPAP 5. Keep sats >= 92%. 4. Decrease IV fluid to 50 mL/hour.     Intervention Category Major Interventions: Change in mental status - evaluation and management Intermediate Interventions: Respiratory distress - evaluation and management  Sommer,Steven Eugene 02/10/2016, 10:44 PM

## 2016-02-10 NOTE — Progress Notes (Signed)
CRITICAL VALUE ALERT  Critical value received:  Lactic Acid 2.3  Date of notification:  02/10/2016  Time of notification:  [id number]  Critical value read back:Yes.    Nurse who received alert:  Jeannie Fend RN  MD notified (1st page):  Dr. Maryland Pink  Time of first page:  1138 Text page  MD notified (2nd page):  Time of second page:  Responding MD:  Dr. Maryland Pink  Time MD responded:  1138 text page

## 2016-02-10 NOTE — Progress Notes (Signed)
CRITICAL VALUE ALERT  Critical value received:  Lactic Acid 2.4  Date of notification:  02/10/16  Time of notification:  0214  Critical value read back: Yes  Nurse who received alert:  Yehuda Budd  MD notified (1st page): Rogue Bussing NP   Time of first page:  0215

## 2016-02-10 NOTE — Progress Notes (Signed)
CRITICAL VALUE ALERT  Critical value received: lactic Acid 2.5  Date of notification:  02/10/2016  Time of notification:  [id number]  Critical value read back:Yes.    Nurse who received alert:  Jeannie Fend RN  MD notified (1st page):  Dr. Maryland Pink  Time of first page:  1557  MD notified (2nd page):  Time of second page:   Responding MD:  Dr. Maryland Pink Text page  Time MD responded:  1557 Text page

## 2016-02-10 NOTE — Progress Notes (Signed)
Critical value received: Lactic Acid 4.6  Date of notification: 02/10/16  Time of notification: 0643  Critical value read back: Yes  Nurse who received alert: Yehuda Budd  MD notified (1st page): Rogue Bussing NP   Time of first page: [phone]

## 2016-02-11 ENCOUNTER — Inpatient Hospital Stay (HOSPITAL_COMMUNITY): Payer: Commercial Managed Care - HMO

## 2016-02-11 DIAGNOSIS — I2692 Saddle embolus of pulmonary artery without acute cor pulmonale: Secondary | ICD-10-CM | POA: Insufficient documentation

## 2016-02-11 DIAGNOSIS — R0600 Dyspnea, unspecified: Secondary | ICD-10-CM | POA: Insufficient documentation

## 2016-02-11 DIAGNOSIS — J9601 Acute respiratory failure with hypoxia: Secondary | ICD-10-CM

## 2016-02-11 DIAGNOSIS — J9600 Acute respiratory failure, unspecified whether with hypoxia or hypercapnia: Secondary | ICD-10-CM | POA: Insufficient documentation

## 2016-02-11 LAB — BLOOD GAS, ARTERIAL
ACID-BASE DEFICIT: 12.8 mmol/L — AB (ref 0.0–2.0)
Acid-base deficit: 1.7 mmol/L (ref 0.0–2.0)
Acid-base deficit: 16.4 mmol/L — ABNORMAL HIGH (ref 0.0–2.0)
Bicarbonate: 10.3 mEq/L — ABNORMAL LOW (ref 20.0–24.0)
Bicarbonate: 12.9 mEq/L — ABNORMAL LOW (ref 20.0–24.0)
Bicarbonate: 20.3 mEq/L (ref 20.0–24.0)
DRAWN BY: 103701
DRAWN BY: 103701
DRAWN BY: 235321
Delivery systems: POSITIVE
Expiratory PAP: 6
FIO2: 1
FIO2: 1
FIO2: 1
Inspiratory PAP: 18
MECHVT: 520 mL
MECHVT: 520 mL
Mode: POSITIVE
O2 SAT: 70.5 %
O2 SAT: 93.4 %
O2 Saturation: 75.8 %
PATIENT TEMPERATURE: 98.6
PATIENT TEMPERATURE: 98.6
PATIENT TEMPERATURE: 99
PCO2 ART: 27.8 mmHg — AB (ref 35.0–45.0)
PEEP: 8 cmH2O
PEEP: 14 cmH2O
PH ART: 7.258 — AB (ref 7.350–7.450)
PH ART: 7.477 — AB (ref 7.350–7.450)
PO2 ART: 51.1 mmHg — AB (ref 80.0–100.0)
RATE: 15 resp/min
RATE: 28 resp/min
RATE: 32 resp/min
TCO2: 10.1 mmol/L (ref 0–100)
TCO2: 12.3 mmol/L (ref 0–100)
TCO2: 17.9 mmol/L (ref 0–100)
pCO2 arterial: 29.9 mmHg — ABNORMAL LOW (ref 35.0–45.0)
pCO2 arterial: 27.9 mmHg — ABNORMAL LOW (ref 35.0–45.0)
pH, Arterial: 7.194 — CL (ref 7.350–7.450)
pO2, Arterial: 44.8 mmHg — ABNORMAL LOW (ref 80.0–100.0)
pO2, Arterial: 66.6 mmHg — ABNORMAL LOW (ref 80.0–100.0)

## 2016-02-11 LAB — CBC WITH DIFFERENTIAL/PLATELET
BASOS ABS: 0 10*3/uL (ref 0.0–0.1)
BASOS PCT: 0 %
EOS PCT: 3 %
Eosinophils Absolute: 0 10*3/uL (ref 0.0–0.7)
HEMATOCRIT: 34.6 % — AB (ref 36.0–46.0)
HEMOGLOBIN: 11.7 g/dL — AB (ref 12.0–15.0)
LYMPHS ABS: 0.2 10*3/uL — AB (ref 0.7–4.0)
LYMPHS PCT: 20 %
MCH: 32.3 pg (ref 26.0–34.0)
MCHC: 33.8 g/dL (ref 30.0–36.0)
MCV: 95.6 fL (ref 78.0–100.0)
MONOS PCT: 9 %
Monocytes Absolute: 0.1 10*3/uL (ref 0.1–1.0)
NEUTROS ABS: 0.5 10*3/uL — AB (ref 1.7–7.7)
Neutrophils Relative %: 68 %
Platelets: 69 10*3/uL — ABNORMAL LOW (ref 150–400)
RBC: 3.62 MIL/uL — ABNORMAL LOW (ref 3.87–5.11)
RDW: 22.1 % — ABNORMAL HIGH (ref 11.5–15.5)
WBC MORPHOLOGY: INCREASED
WBC: 0.8 10*3/uL — CL (ref 4.0–10.5)

## 2016-02-11 LAB — BASIC METABOLIC PANEL
Anion gap: 10 (ref 5–15)
BUN: 21 mg/dL — ABNORMAL HIGH (ref 6–20)
CALCIUM: 7.7 mg/dL — AB (ref 8.9–10.3)
CO2: 21 mmol/L — AB (ref 22–32)
CREATININE: 0.98 mg/dL (ref 0.44–1.00)
Chloride: 97 mmol/L — ABNORMAL LOW (ref 101–111)
GFR calc non Af Amer: >60 mL/min (ref >60)
Glucose, Bld: 279 mg/dL — ABNORMAL HIGH (ref 65–99)
Potassium: 3.7 mmol/L (ref 3.5–5.1)
SODIUM: 128 mmol/L — AB (ref 135–145)

## 2016-02-11 LAB — GLUCOSE, CAPILLARY
Glucose-Capillary: 234 mg/dL — ABNORMAL HIGH (ref 65–99)
Glucose-Capillary: 344 mg/dL — ABNORMAL HIGH (ref 65–99)

## 2016-02-11 LAB — URINE CULTURE: Culture: NO GROWTH

## 2016-02-11 LAB — PREGNANCY, URINE: PREG TEST UR: NEGATIVE

## 2016-02-11 MED ORDER — SODIUM BICARBONATE 8.4 % IV SOLN
INTRAVENOUS | Status: AC
  Administered 2016-02-11: 100 meq
  Filled 2016-02-11: qty 50

## 2016-02-11 MED ORDER — SODIUM CHLORIDE 0.9 % IV BOLUS (SEPSIS)
1000.0000 mL | Freq: Once | INTRAVENOUS | Status: AC
  Administered 2016-02-11: 1000 mL via INTRAVENOUS

## 2016-02-11 MED ORDER — ETOMIDATE 2 MG/ML IV SOLN
0.3000 mg/kg | Freq: Once | INTRAVENOUS | Status: AC
  Administered 2016-02-11: 20 mg via INTRAVENOUS

## 2016-02-11 MED ORDER — ADENOSINE 6 MG/2ML IV SOLN
INTRAVENOUS | Status: AC
  Filled 2016-02-11: qty 2

## 2016-02-11 MED ORDER — ANTISEPTIC ORAL RINSE SOLUTION (CORINZ)
7.0000 mL | OROMUCOSAL | Status: DC
  Administered 2016-02-11 (×3): 7 mL via OROMUCOSAL

## 2016-02-11 MED ORDER — SODIUM CHLORIDE 0.9 % IV SOLN: 100.0000 mg | INTRAVENOUS | Status: DC

## 2016-02-11 MED ORDER — CHLORHEXIDINE GLUCONATE 0.12% ORAL RINSE (MEDLINE KIT)
15.0000 mL | Freq: Two times a day (BID) | OROMUCOSAL | Status: DC
  Administered 2016-02-11: 15 mL via OROMUCOSAL

## 2016-02-11 MED ORDER — ADENOSINE 6 MG/2ML IV SOLN
6.0000 mg | Freq: Once | INTRAVENOUS | Status: AC
  Administered 2016-02-11: 6 mg via INTRAVENOUS

## 2016-02-11 MED ORDER — MIDAZOLAM HCL 2 MG/2ML IJ SOLN
2.0000 mg | INTRAMUSCULAR | Status: DC | PRN
  Administered 2016-02-11: 2 mg via INTRAVENOUS
  Filled 2016-02-11: qty 2

## 2016-02-11 MED ORDER — VASOPRESSIN 20 UNIT/ML IV SOLN
0.0300 [IU]/min | INTRAVENOUS | Status: DC
  Administered 2016-02-11: 0.03 [IU]/min via INTRAVENOUS
  Filled 2016-02-11: qty 2

## 2016-02-11 MED ORDER — FENTANYL CITRATE (PF) 100 MCG/2ML IJ SOLN: 50.0000 ug | Freq: Once | INTRAMUSCULAR | Status: DC

## 2016-02-11 MED ORDER — SODIUM BICARBONATE 8.4 % IV SOLN: 100.0000 meq | Freq: Once | INTRAVENOUS | Status: DC

## 2016-02-11 MED ORDER — NOREPINEPHRINE BITARTRATE 1 MG/ML IV SOLN
0.0000 ug/min | INTRAVENOUS | Status: DC
  Administered 2016-02-11: 50 ug/min via INTRAVENOUS
  Filled 2016-02-11: qty 16

## 2016-02-11 MED ORDER — SODIUM BICARBONATE 8.4 % IV SOLN
200.0000 meq | Freq: Once | INTRAVENOUS | Status: AC
  Administered 2016-02-11: 200 meq via INTRAVENOUS

## 2016-02-11 MED ORDER — MIDAZOLAM HCL 2 MG/2ML IJ SOLN: 2.0000 mg | INTRAMUSCULAR | Status: DC | PRN

## 2016-02-11 MED ORDER — CETYLPYRIDINIUM CHLORIDE 0.05 % MT LIQD
7.0000 mL | Freq: Two times a day (BID) | OROMUCOSAL | Status: DC
  Administered 2016-02-11 (×2): 7 mL via OROMUCOSAL

## 2016-02-11 MED ORDER — SODIUM CHLORIDE 0.9 % IV SOLN
1.0000 g | Freq: Once | INTRAVENOUS | Status: DC
Start: 2016-02-11 — End: 2016-02-12
  Filled 2016-02-11: qty 10

## 2016-02-11 MED ORDER — SODIUM BICARBONATE 8.4 % IV SOLN
INTRAVENOUS | Status: AC
  Filled 2016-02-11: qty 100

## 2016-02-11 MED ORDER — FLUCONAZOLE IN SODIUM CHLORIDE 100-0.9 MG/50ML-% IV SOLN
100.0000 mg | INTRAVENOUS | Status: DC
  Administered 2016-02-11: 100 mg via INTRAVENOUS
  Filled 2016-02-11: qty 50

## 2016-02-11 MED ORDER — FENTANYL BOLUS VIA INFUSION
50.0000 ug | INTRAVENOUS | Status: DC | PRN
  Filled 2016-02-11: qty 50

## 2016-02-11 MED ORDER — STERILE WATER FOR INJECTION IV SOLN
INTRAVENOUS | Status: DC
  Administered 2016-02-11: 17:00:00 via INTRAVENOUS
  Filled 2016-02-11: qty 850

## 2016-02-11 MED ORDER — PANTOPRAZOLE SODIUM 40 MG IV SOLR
40.0000 mg | INTRAVENOUS | Status: DC
  Administered 2016-02-11: 40 mg via INTRAVENOUS
  Filled 2016-02-11: qty 40

## 2016-02-11 MED ORDER — DEXAMETHASONE SODIUM PHOSPHATE 4 MG/ML IJ SOLN: 8.0000 mg | Freq: Four times a day (QID) | INTRAMUSCULAR | Status: DC

## 2016-02-11 MED ORDER — METOPROLOL TARTRATE 5 MG/5ML IV SOLN
INTRAVENOUS | Status: AC
  Administered 2016-02-11: 14:00:00
  Filled 2016-02-11: qty 5

## 2016-02-11 MED ORDER — SODIUM CHLORIDE 0.9 % IV BOLUS (SEPSIS)
500.0000 mL | Freq: Once | INTRAVENOUS | Status: AC
  Administered 2016-02-11: 500 mL via INTRAVENOUS

## 2016-02-11 MED ORDER — SODIUM CHLORIDE 0.9 % IV SOLN
200.0000 mg | Freq: Once | INTRAVENOUS | Status: DC
  Filled 2016-02-11: qty 200

## 2016-02-11 MED ORDER — SODIUM CHLORIDE 0.9 % IV SOLN
500.0000 mg | Freq: Four times a day (QID) | INTRAVENOUS | Status: DC
  Filled 2016-02-11 (×3): qty 500

## 2016-02-11 MED ORDER — CHLORHEXIDINE GLUCONATE 0.12 % MT SOLN
15.0000 mL | Freq: Two times a day (BID) | OROMUCOSAL | Status: DC
  Administered 2016-02-11: 15 mL via OROMUCOSAL
  Filled 2016-02-11: qty 15

## 2016-02-11 MED ORDER — NOREPINEPHRINE BITARTRATE 1 MG/ML IV SOLN
0.0000 ug/min | INTRAVENOUS | Status: DC
  Administered 2016-02-11: 40 ug/min via INTRAVENOUS
  Administered 2016-02-11: 2 ug/min via INTRAVENOUS
  Filled 2016-02-11 (×3): qty 4

## 2016-02-11 MED ORDER — HEPARIN (PORCINE) IN NACL 100-0.45 UNIT/ML-% IJ SOLN
1250.0000 [IU]/h | INTRAMUSCULAR | Status: DC
  Filled 2016-02-11: qty 250

## 2016-02-11 MED ORDER — ACETAMINOPHEN 10 MG/ML IV SOLN
1000.0000 mg | Freq: Once | INTRAVENOUS | Status: AC
  Administered 2016-02-11: 1000 mg via INTRAVENOUS
  Filled 2016-02-11: qty 100

## 2016-02-11 MED ORDER — SODIUM BICARBONATE 8.4 % IV SOLN
INTRAVENOUS | Status: AC
  Filled 2016-02-11: qty 50

## 2016-02-11 MED ORDER — DEXAMETHASONE SODIUM PHOSPHATE 4 MG/ML IJ SOLN
4.0000 mg | Freq: Two times a day (BID) | INTRAMUSCULAR | Status: DC
  Administered 2016-02-11: 4 mg via INTRAVENOUS
  Filled 2016-02-11: qty 1

## 2016-02-11 MED ORDER — SODIUM CHLORIDE 0.9 % IV SOLN
25.0000 ug/h | INTRAVENOUS | Status: DC
  Administered 2016-02-11: 50 ug/h via INTRAVENOUS
  Filled 2016-02-11: qty 50

## 2016-02-11 MED FILL — Medication: Qty: 1 | Status: AC

## 2016-02-12 ENCOUNTER — Ambulatory Visit: Payer: Commercial Managed Care - HMO | Admitting: Radiation Oncology

## 2016-02-12 ENCOUNTER — Ambulatory Visit: Payer: Commercial Managed Care - HMO

## 2016-02-12 LAB — HEMOGLOBIN A1C
Hgb A1c MFr Bld: 8.8 % — ABNORMAL HIGH (ref 4.8–5.6)
MEAN PLASMA GLUCOSE: 206 mg/dL

## 2016-02-12 LAB — BLOOD GAS, ARTERIAL
ACID-BASE DEFICIT: 25.8 mmol/L — AB (ref 0.0–2.0)
BICARBONATE: 5.9 meq/L — AB (ref 20.0–24.0)
Drawn by: 331471
FIO2: 1
O2 Saturation: 84.6 %
PATIENT TEMPERATURE: 98.6
PO2 ART: 78.9 mmHg — AB (ref 80.0–100.0)
TCO2: 6.1 mmol/L (ref 0–100)
pCO2 arterial: 27.8 mmHg — ABNORMAL LOW (ref 35.0–45.0)
pH, Arterial: 6.957 — CL (ref 7.350–7.450)

## 2016-02-13 ENCOUNTER — Ambulatory Visit: Payer: Commercial Managed Care - HMO

## 2016-02-13 LAB — ACID FAST SMEAR (AFB): ACID FAST SMEAR - AFSCU2: NEGATIVE

## 2016-02-13 LAB — ACID FAST SMEAR (AFB, MYCOBACTERIA)

## 2016-02-14 LAB — CULTURE, BLOOD (ROUTINE X 2): Culture: NO GROWTH

## 2016-02-14 LAB — CULTURE, BAL-QUANTITATIVE W GRAM STAIN

## 2016-02-14 LAB — CULTURE, BAL-QUANTITATIVE

## 2016-02-15 ENCOUNTER — Other Ambulatory Visit: Payer: Commercial Managed Care - HMO

## 2016-02-15 ENCOUNTER — Telehealth: Payer: Self-pay

## 2016-02-15 ENCOUNTER — Ambulatory Visit: Payer: Commercial Managed Care - HMO | Admitting: Hematology and Oncology

## 2016-02-15 ENCOUNTER — Ambulatory Visit: Payer: Commercial Managed Care - HMO

## 2016-02-15 LAB — CULTURE, BLOOD (ROUTINE X 2): CULTURE: NO GROWTH

## 2016-02-15 NOTE — Telephone Encounter (Signed)
On 02/15/2016 I received a death certificate from [place] (original). The death certificate is for burial. The patient is a patient of Doctor Titus Mould. The death certificate will be taken to [facility] ([id number]) this am for Doctor Lamonte Sakai to sign the certificate since Doctor Titus Mould is on vacation till Monday (02/19/2016). On Mar 11, 2016 I received the death certificate back from Doctor Byrum. I got the death certificate ready and called the funeral home to let them know the death certificate is ready for pickup.

## 2016-02-16 ENCOUNTER — Encounter: Payer: Self-pay | Admitting: Radiation Oncology

## 2016-02-16 NOTE — Progress Notes (Signed)
Paperwork received from doctor, faxed to [facility] [phone], confirmation received, 5/12

## 2016-02-19 NOTE — Progress Notes (Addendum)
  Radiation Oncology         (336) [phone] ________________________________  Name: Jamie Edwards MRN: [id number]  Date: 02/21/2016  DOB: 10-07-1971  End of Treatment Note  DIAGNOSIS:    ICD-9-CM ICD-10-CM   1. Brain metastases ([place]) 198.3 C79.31        Indication for treatment: palliative    Radiation treatment dates:   01/25/2016-02/28/2016  Site/dose:   Whole brain / 30 Gy in 12 fractions  Beams/energy:   2 fields / 6X  Narrative: The patient tolerated radiation treatment but became profoundly fatigued toward the end of her 14 fraction planned course and reported a bed sore in her decubitus region.  She was too weak to stand up and was sent to the ED for evaluation.  She was admitted with diagnosis of sepsis and sadly died in the hospital, days later.  Plan: Card of sympathy sent to patient's family. -----------------------------------  Eppie Gibson, MD

## 2016-02-23 ENCOUNTER — Encounter: Payer: Commercial Managed Care - HMO | Admitting: Physical Therapy

## 2016-02-26 ENCOUNTER — Encounter: Payer: Self-pay | Admitting: *Deleted

## 2016-03-07 NOTE — Procedures (Signed)
Intubation Procedure Note Jamie Edwards [id number] August 19, 1971  Procedure: Intubation Indications: Respiratory insufficiency  Procedure Details Consent: Risks of procedure as well as the alternatives and risks of each were explained to the (patient/caregiver).  Consent for procedure obtained. Time Out: Verified patient identification, verified procedure, site/side was marked, verified correct patient position, special equipment/implants available, medications/allergies/relevent history reviewed, required imaging and test results available.  Performed  Maximum sterile technique was used including gloves, hand hygiene and mask.  Mac-3 Glidescope    Evaluation Hemodynamic Status: Persistent hypotension treated with pressors; O2 sats: stable throughout Patient's Current Condition: stable Complications: No apparent complications Patient did tolerate procedure well. Chest X-ray ordered to verify placement.  CXR: pending. Tube position confirmed with FOB    [name], MD, PhD 2016-02-12, 11:36 AM [facility] [phone] or if no answer [phone]

## 2016-03-07 NOTE — Progress Notes (Signed)
Norepinephrine changed from single strength (0.016 mg/mL) to quadruple strength (0.064 mg/mL) due to high rate of infusion. Notified Jeannie Fend and Jacklynn Lewis, RN, to adjust the settings on the Alaris Pump guardrails.    Lindell Spar, PharmD, BCPS Pager: [phone] 2016/02/25 2:03 PM

## 2016-03-07 NOTE — Progress Notes (Signed)
[facility] Progress Note Patient Name: KERRYN CADWALLADER DOB: 07/10/1971 MRN: [id number]   Date of Service  2016/02/24  HPI/Events of Note  resp disttess as below - on cam care HR 153 with soft bp - echo show she is dry (got lasix before bp drop)  eICU Interventions  1L fluid bolus statt abg Stat cxr  Might be heading towards to intubation   Variable  0 Points 1 Point 2 Points Total  Heart rate per minute  <90 beats 90-109 beats >110 beats 2  Respiratory  Rate per minute < 18 breaths 19-30 breaths  >30 breaths 2  Restlessness; nonpurposeful movements None  occas slight movement Frequent movement 1  Paradoxical breathing pattern: None  Present 1  Accessory muscle use: rise in clavicle during inspiration None Slight rise Pronounced rise 1  Grunting at end-expiration: guttural sound None  Present 0  Nasal flaring: involuntary movement of nares None  Present 0  Look of fear None  Eyes wide 1  Overall total out of 16    8    Respiratory Distress Observation Scale Journal of Palliative Medicine Vol. 13, Number 3, 2010 Campbell et al.      Intervention Category Major Interventions: Respiratory failure - evaluation and management;Sepsis - evaluation and management;Other:  Malayla Granberry 02/24/2016, 12:02 AM

## 2016-03-07 NOTE — Progress Notes (Signed)
ELink called for change in patient status: decrease in LOC HR elevated to 150s.

## 2016-03-07 NOTE — Progress Notes (Signed)
eLink Physician-Brief Progress Note Patient Name: Jamie Edwards DOB: 29-Oct-1970 MRN: [id number]   Date of Service  2016/02/23  HPI/Events of Note  D/w fellow who saw apatient at bedside and has felt continued bipap.Patient holding  eICU Interventions       Intervention Category Major Interventions: Other:  Tzipora Mcinroy 02-23-16, 4:31 AM

## 2016-03-07 NOTE — Progress Notes (Signed)
eLink Physician-Brief Progress Note Patient Name: Jamie Edwards DOB: 10-22-1970 MRN: [id number]   Date of Service  2016-03-04  HPI/Events of Note  Briefly coded - bradycardia >> lose of pulse. CPR/EPI X < 5 minutes >> ROSC.   eICU Interventions  Will order: 1. ABG STAT. 2. Portable CXR STAT. 3. Dr.Feinstein notified of events. He will speak with Dr. Lamonte Sakai and go to the bedside.      Intervention Category Major Interventions: Code management / supervision  Leen Tworek Eugene 03-04-2016, 4:21 PM

## 2016-03-07 NOTE — Progress Notes (Signed)
Name: Jamie Edwards MRN: [id number] DOB: [birth year]    ADMISSION DATE:  02/15/2016 CONSULTATION DATE:  02/10/16  REFERRING MD :  Dr Maryland Pink  CHIEF COMPLAINT:  Lactic acidosis, weakness  SIGNIFICANT EVENTS   STUDIES:  V/q 5/6 >> low prob for PE TTE 5/6 >> Vigorous systolic function, EF [id number], small IVC with presumed hypovolemia,   HISTORY OF PRESENT ILLNESS:  45 yo woman with breast CA metastatic to brain, B lungs, L pleural space, mediastinum. Undergoing whole repeat palliative brain radiation therapy since 01/26/16. Also note plan for possible repeat chemo based on Oncology note from 5/1. She has had very poor PO intake, has been weak. Experienced oral thrush. Was seen by Dr Isidore Moos on 5/5 and was sent to the ED given her degree of fatigue and weakness. Pt also describes new dry cough. CXR 5/5 showed possible new RLL infiltrate, no real change in L pleural disease or known nodules. Started on HCAP abx coverage. She was not in shock but lactate 3.85 > 2.4 > 4.2.   SUBJECTIVE:  6+ L volume resuscitation last 24h, lactate cleared Developed more tachypnea and hypoxemia > diuresed and started BiPAP. Currently tolerating BiPAP but tachypneic BP marginal   VITAL SIGNS: Temp:  [97.8 F (36.6 C)-102.3 F (39.1 C)] 102.3 F (39.1 C) (05/07 0400) Pulse Rate:  [109-157] 139 (05/07 0500) Resp:  [25-45] 32 (05/07 0600) BP: (71-113)/(22-68) 98/54 mmHg (05/07 0600) SpO2:  [85 %-98 %] 91 % (05/07 0500) FiO2 (%):  [100 %] 100 % (05/07 0312)  PHYSICAL EXAMINATION: General:  Chronically Ill appearing, weak Neuro:  Awake and alert, globally weak but non focal HEENT:  Op clear, no thrush seen, BiPAp in place 5/7 Cardiovascular:  Tachycardic and regular, no M Lungs:  Small breaths, no wheeze or crackles,  Abdomen:  Soft, benign Musculoskeletal:  No LE edema Skin:  No rash   Recent Labs Lab 02/10/16 0120 02/10/16 0500 18-Feb-2016 0509  NA 135 133* 128*  K 5.1 4.1 3.7  CL 97* 100* 97*   CO2 25 23 21*  BUN 25* 24* 21*  CREATININE 0.80 0.78 0.98  GLUCOSE 434* 283* 279*    Recent Labs Lab 02/21/2016 2035 02/10/16 0500 02/18/2016 0437  HGB 14.7 11.8* 11.7*  HCT 41.1 34.1* 34.6*  WBC 6.7 3.7* 0.8*  PLT 154 99* 69*   Nm Pulmonary Perf And Vent  02/10/2016  CLINICAL DATA:  Chronic pulmonary embolism with acute cor pulmonale, breast cancer metastatic to thoracic lymph nodes, lungs, RIGHT lobe liver and RIGHT iliac bone, pulmonary embolism 2016 by CTA chest EXAM: NUCLEAR MEDICINE VENTILATION - PERFUSION LUNG SCAN TECHNIQUE: Ventilation images were obtained in multiple projections using inhaled aerosol Tc-7m DTPA. Perfusion images were obtained in multiple projections after intravenous injection of Tc-50m MAA. RADIOPHARMACEUTICALS:  30 mCi Technetium-10m DTPA aerosol inhalation and 4.0 mCi Technetium-53m MAA IV COMPARISON:  None Correlation:  Chest radiograph 02/10/2016 FINDINGS: Ventilation: Significant tracheal deposition of aerosol. Swallowed aerosol throughout esophagus and in stomach. Ventilatory defects at the lateral lung bases correspond to tissue expanders on chest radiography. Subsegmental ventilatory defects in LEFT upper lobe. Perfusion: Matching diminished perfusion in LEFT upper lobe. No additional perfusion defects. Chest radiograph demonstrates pulmonary edema and BILATERAL breast tissue expanders. IMPRESSION: Matching diminished subsegmental ventilation and perfusion in LEFT upper lobe. Low probability for pulmonary embolism. Electronically Signed   By: Lavonia Dana M.D.   On: 02/10/2016 22:21   Dg Chest Port 1 View  02/18/2016  CLINICAL DATA:  Acute respiratory  failure.  Hypoxia. EXAM: PORTABLE CHEST 1 VIEW COMPARISON:  02/10/2016 FINDINGS: Right jugular central line extends to the low SVC. Increasingly confluent edema or infectious infiltrate in the central right lung. There may be small pleural effusions. IMPRESSION: Worsening airspace opacity in the central right lung.  Probable small effusions. Electronically Signed   By: Andreas Newport M.D.   On: 02-25-2016 00:33   Dg Chest Port 1 View  02/10/2016  CLINICAL DATA:  Acute shortness of breath and hypoxia. EXAM: PORTABLE CHEST 1 VIEW COMPARISON:  02/10/2016 and prior exams FINDINGS: This is a low volume film. Increasing central and lower lung opacities noted, favor pulmonary edema over infection. There is no evidence of pneumothorax. A right IJ central venous catheter with tip overlying the superior cavoatrial junction again noted. Bilateral breast prosthesis is identified. IMPRESSION: Increasing central lower lung opacities -favor pulmonary edema over infection. Electronically Signed   By: Margarette Canada M.D.   On: 02/10/2016 16:58   Dg Chest Port 1 View  02/10/2016  CLINICAL DATA:  Central line adjustment EXAM: PORTABLE CHEST 1 VIEW COMPARISON:  02/17/2016 at 23:10 FINDINGS: The right jugular central line extends into the low SVC. No pneumothorax. No other significant interval change. IMPRESSION: Right jugular central line.  No pneumothorax. Electronically Signed   By: Andreas Newport M.D.   On: 02/10/2016 00:25   Dg Chest Port 1 View  02/07/2016  CLINICAL DATA:  Central line placement EXAM: PORTABLE CHEST 1 VIEW COMPARISON:  02/16/2016 at 19:04 FINDINGS: There is a new right jugular central line, extending through the right heart chambers and down into the inferior vena cava. Withdrawing approximately 12 cm should result in tip residing in the region of the superior cavoatrial junction. No pneumothorax. Mild patchy right base opacity and bilateral hilar adenopathy again evident. IMPRESSION: The new right jugular central line extends beyond the right heart chambers and on into the IVC. Recommend withdrawing for optimal placement, approximately 12-14 cm. These results will be called to the ordering clinician or representative by the Radiologist Assistant, and communication documented in the PACS or zVision Dashboard.  Electronically Signed   By: Andreas Newport M.D.   On: 02/12/2016 23:25   Dg Chest Port 1 View  02/07/2016  CLINICAL DATA:  Increasing weakness and fatigue over the past week. Recent diagnosis of lung cancer. EXAM: PORTABLE CHEST 1 VIEW COMPARISON:  01/31/2016, 01/20/2016. FINDINGS: Basilar opacities are significantly reduced compared to 01/20/2016, with mild residual patchy opacity in the medial right base. Bilateral hilar adenopathy again noted. Probable small left pleural effusion. Pulmonary vasculature is normal. IMPRESSION: Probable small left pleural effusion. Mild patchy opacity in the medial right base. Unchanged hilar adenopathy. Electronically Signed   By: Andreas Newport M.D.   On: 03/02/2016 19:27    ASSESSMENT / PLAN:  CULTURES: Urine 5/5 >>  Blood 5/5 >>   ANTIBIOTICS: vanco 5/6 >> Aztreonam 5/6 >.  Flagyl 5/6 >>  Fluconazole 5/6 >>   SIGNIFICANT EVENTS: BiPAP started 5/6 pm  LINES/TUBES: R IJ CVC 5/5 >>   DISCUSSION: 45 year old woman with a history of metastatic breast cancer status post chemoradiation in the past, treated VTE. Now on whole brain irradiation and planning for possible chemotherapy due to progression (local, brain, left pleural, bilateral lung parenchymal and hilar). Admitted with sepsis and suspected right HCAP. Progressive hypoxemic resp failure, evolving shock without any other clear end-organ injury. V/q on 5/6 low prob for PE. Started BiPAP 5/6 for hypoxemia, is stable but tachypneic. BP remains borderline.  High risk for intubation.   ASSESSMENT / PLAN:  PULMONARY A: Acute hypoxemic respiratory failure, likely due to right pneumonia superimposed on existing metastatic breast cancer to lung and pleural space, obesity, and possibly a component of cardiogenic edema following aggressive IV fluid resuscitation. History of treated pulmonary embolism, current VQ scan low probability Left pleural involvement and bilateral parenchymal involvement from  breast cancer P:   BiPAP support for now. She is tachypnea but is tolerating. Low threshold for intubation and mechanical ventilation Pulmonary hygiene Antibiotics as below Consider chest CT or further imaging as we go forward to characterize infiltrate vs malignancy vs evolving effusion(s)  CARDIOVASCULAR A:  Shock, presumed septic shock P:  Follow CVP, currently at 10. Will try to be less aggressive with IV fluids given her marginal respiratory status. Goal CVP 10 Initiate norepinephrine Consider stress dose steroids (currently on dexamethasone)  RENAL A:   Acute and a gap metabolic acidosis, improved Lactic acidosis, improved Hyponatremia  P:   Follow BMP  GASTROINTESTINAL A:   SUP prophylaxis Nutrition  P:   Nothing by mouth for now on BiPAP Add PPI  HEMATOLOGIC/ ONC A:   Metastatic breast cancer with brain metastases, left pleural metastases, bilateral parenchymal and hilar metastases P:  Supportive care Has been receiving whole brain radiation since recent recognition of progression. Also planning for possible chemo regimen, has not been started yet  INFECTIOUS A:   R HCAP Septic shock P:   empiric broad abx: Flagyl plus vancomycin plus aztreonam Plan d/c fluconazole on 5/8 (thrush) Follow cx data   ENDOCRINE A:   Hyperglycemia on steroids P:   SSI lantus 10  NEUROLOGIC A:   Lethargy, suspect due to underlying illness P:   RASS goal: 0 Follow MS and airway protection closely, will influence whether she needs intubation   FAMILY  - Updates: updated husband on 5/6, updated sister bedside 5/7.   - Inter-disciplinary family meet or Palliative Care meeting due by:  02/17/16  PCCM will assume her care 5/7  Independent CC time 83 minutes   [name], MD, PhD 2016/03/02, 8:18 AM [facility] [phone] or if no answer [phone]

## 2016-03-07 NOTE — Progress Notes (Signed)
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 She presented with fatigue, poor appetite, increased urination, thirst. She was found to be tachycardic and hypotensive. She was found to be hyperglycemic. She was hospitalized for further management. There was also concern for sepsis due to elevated lactic acid level and cough. She was started on antibiotics for presumed pneumonia. On March 6, patient became more tachypneic and hypoxic. She was also more tachycardic. Evaluation for pulmonary embolism was negative.  Past medical history:  Past Medical History  Diagnosis Date  . Increased heart rate 11/2014    pt. put on metoprolol  . Pulmonary embolism ([place]) 11/2014  . DVT (deep venous thrombosis) ([place]) 11/2014    BUE  . Cancer of right breast ([place]) 08/2014    S/P chemo 09/2014-02/02/2015  . Heart murmur     pt. states a doctor stated she had a slight murmur, no studies done. Present PCP has never mention it to her.  . SVT (supraventricular tachycardia) [facility])     Consultants: Pulmonology.  Procedures:  Transthoracic echocardiogram Study Conclusions - Left ventricle: The cavity size was normal. Wall thickness was normal. Systolic function was vigorous. The estimated ejection fraction was in the range of 65% to 70%. Wall motion was normal; there were no regional wall motion abnormalities. Left ventricular diastolic function parameters were normal. - Aortic valve: Valve area (VTI): 2.04 cm^2. Valve area (Vmax): 1.98 cm^2. - Systemic veins: IVC is poorly visualized but appears to be small, suggesting low RA pressure and hypovolemia.   Central line placement by critical care medicine  Antibiotics: Vancomycin, aztreonam and  metronidazole 5/5  Subjective: Patient was placed on BiPAP overnight. She denies any complaints including chest pain. Her sister is at the bedside. No nausea or vomiting. Has been somewhat sleepy.  Objective:  Vital Signs  Filed Vitals:   Feb 15, 2016 0300 02-15-2016 0400 02-15-2016 0500 Feb 15, 2016 0600  BP: 90/48 95/59 92/49  98/54  Pulse: 133 133 139   Temp:  102.3 F (39.1 C)    TempSrc:  Axillary    Resp: 28 30 35 32  Height:      Weight:      SpO2: 94% 94% 91%     Intake/Output Summary (Last 24 hours) at 02-15-16 0723 Last data filed at 02-15-2016 0601  Gross per 24 hour  Intake 7907.79 ml  Output   1000 ml  Net 6907.79 ml   Filed Weights   02/10/2016 1812 02/10/16 0100  Weight: 93.441 kg (206 lb) 88.8 kg (195 lb 12.3 oz)    General appearance: alert, cooperative, appears stated age and no distress Resp: Diminished air entry at the bases. No wheezing, no rhonchi. Cardio: S1, S2 is tachycardic. Regular. No S3, S4. No rubs, murmurs, or bruit.  GI: soft, non-tender; bowel sounds normal; no masses,  no organomegaly Neurologic: Awake and alert. Oriented 3. No obvious focal neurological deficits.  Lab Results:  Data Reviewed: I have personally reviewed following labs and imaging studies  CBC:  Recent Labs Lab 02/05/16 1302 02/14/2016 2035 02/10/16 0500 02-15-2016 0437  WBC 21.8* 6.7 3.7* 0.8*  NEUTROABS 21.0* 6.4  --  0.5*  HGB 14.2 14.7 11.8* 11.7*  HCT 41.1 41.1 34.1* 34.6*  MCV 94.5 92.2 92.9 95.6  PLT 127* 154 99* 69*  Basic Metabolic Panel:  Recent Labs Lab 02/05/16 1302 02/25/2016 1903 02/10/16 0120 02/10/16 0500 Mar 09, 2016 0509  NA 130* 131* 135 133* 128*  K 5.3 Sl. Hemolysis* 6.7* 5.1 4.1 3.7  CL  --  94* 97* 100* 97*  CO2 22 23 25 23  21*  GLUCOSE 467* 468* 434* 283* 279*  BUN 27.5* 26* 25* 24* 21*  CREATININE 1.0 0.81 0.80 0.78 0.98  CALCIUM 9.1 9.6 9.5 8.8* 7.7*  MG  --   --   --  2.1  --   PHOS  --   --   --  1.7*  --    GFR: Estimated Creatinine  Clearance: 86.4 mL/min (by C-G formula based on Cr of 0.98).  Liver Function Tests:  Recent Labs Lab 02/05/16 1302 02/17/2016 1903 02/10/16 0500  AST 17 37 25  ALT 30 37 27  ALKPHOS 124 118 85  BILITOT 0.99 2.7* 1.2  PROT 6.5 6.4* 5.4*  ALBUMIN 3.0* 3.0* 2.3*   Coagulation Profile:  Recent Labs Lab 02/10/16 0120  INR 1.66*   CBG:  Recent Labs Lab 02/10/16 1124 02/10/16 1232 02/10/16 1338 02/10/16 1655 02/10/16 2243  GLUCAP 156* 161* 154* 203* 242*   Thyroid Function Tests:  Recent Labs  02/10/16 0500  TSH 0.435   Urine analysis:    Component Value Date/Time   COLORURINE YELLOW 02/28/2016 2138   APPEARANCEUR CLEAR 02/14/2016 2138   LABSPEC >1.046* 02/10/2016 2138   PHURINE 6.5 02/29/2016 2138   GLUCOSEU >1000* 02/12/2016 2138   HGBUR TRACE* 02/07/2016 2138   BILIRUBINUR NEGATIVE 03/04/2016 2138   KETONESUR 40* 03/03/2016 2138   PROTEINUR NEGATIVE 02/13/2016 2138   UROBILINOGEN 0.2 11/29/2014 0507   NITRITE NEGATIVE 02/13/2016 2138   LEUKOCYTESUR NEGATIVE 03/05/2016 2138    Recent Results (from the past 240 hour(s))  Blood Culture (routine x 2)     Status: None (Preliminary result)   Collection Time: 02/18/2016  7:03 PM  Result Value Ref Range Status   Specimen Description BLOOD LEFT ANTECUBITAL  Final   Special Requests IN PEDIATRIC BOTTLE 1 ML  Final   Culture   Final    NO GROWTH < 24 HOURS Performed at [facility]    Report Status PENDING  Incomplete  MRSA PCR Screening     Status: None   Collection Time: 02/10/16  1:58 AM  Result Value Ref Range Status   MRSA by PCR NEGATIVE NEGATIVE Final    Comment:        The GeneXpert MRSA Assay (FDA approved for NASAL specimens only), is one component of a comprehensive MRSA colonization surveillance program. It is not intended to diagnose MRSA infection nor to guide or monitor treatment for MRSA infections.       Radiology Studies: Nm Pulmonary Perf And Vent  02/10/2016  CLINICAL  DATA:  Chronic pulmonary embolism with acute cor pulmonale, breast cancer metastatic to thoracic lymph nodes, lungs, RIGHT lobe liver and RIGHT iliac bone, pulmonary embolism 2016 by CTA chest EXAM: NUCLEAR MEDICINE VENTILATION - PERFUSION LUNG SCAN TECHNIQUE: Ventilation images were obtained in multiple projections using inhaled aerosol Tc-46m DTPA. Perfusion images were obtained in multiple projections after intravenous injection of Tc-53m MAA. RADIOPHARMACEUTICALS:  30 mCi Technetium-21m DTPA aerosol inhalation and 4.0 mCi Technetium-79m MAA IV COMPARISON:  None Correlation:  Chest radiograph 02/10/2016 FINDINGS: Ventilation: Significant tracheal deposition of aerosol. Swallowed aerosol throughout esophagus and in stomach. Ventilatory defects at the lateral lung bases correspond to tissue expanders on chest radiography. Subsegmental ventilatory  defects in LEFT upper lobe. Perfusion: Matching diminished perfusion in LEFT upper lobe. No additional perfusion defects. Chest radiograph demonstrates pulmonary edema and BILATERAL breast tissue expanders. IMPRESSION: Matching diminished subsegmental ventilation and perfusion in LEFT upper lobe. Low probability for pulmonary embolism. Electronically Signed   By: Lavonia Dana M.D.   On: 02/10/2016 22:21   Dg Chest Port 1 View  03/01/2016  CLINICAL DATA:  Acute respiratory failure.  Hypoxia. EXAM: PORTABLE CHEST 1 VIEW COMPARISON:  02/10/2016 FINDINGS: Right jugular central line extends to the low SVC. Increasingly confluent edema or infectious infiltrate in the central right lung. There may be small pleural effusions. IMPRESSION: Worsening airspace opacity in the central right lung. Probable small effusions. Electronically Signed   By: Andreas Newport M.D.   On: 01-Mar-2016 00:33   Dg Chest Port 1 View  02/10/2016  CLINICAL DATA:  Acute shortness of breath and hypoxia. EXAM: PORTABLE CHEST 1 VIEW COMPARISON:  02/10/2016 and prior exams FINDINGS: This is a low volume  film. Increasing central and lower lung opacities noted, favor pulmonary edema over infection. There is no evidence of pneumothorax. A right IJ central venous catheter with tip overlying the superior cavoatrial junction again noted. Bilateral breast prosthesis is identified. IMPRESSION: Increasing central lower lung opacities -favor pulmonary edema over infection. Electronically Signed   By: Margarette Canada M.D.   On: 02/10/2016 16:58   Dg Chest Port 1 View  02/10/2016  CLINICAL DATA:  Central line adjustment EXAM: PORTABLE CHEST 1 VIEW COMPARISON:  03/01/2016 at 23:10 FINDINGS: The right jugular central line extends into the low SVC. No pneumothorax. No other significant interval change. IMPRESSION: Right jugular central line.  No pneumothorax. Electronically Signed   By: Andreas Newport M.D.   On: 02/10/2016 00:25   Dg Chest Port 1 View  02/12/2016  CLINICAL DATA:  Central line placement EXAM: PORTABLE CHEST 1 VIEW COMPARISON:  02/23/2016 at 19:04 FINDINGS: There is a new right jugular central line, extending through the right heart chambers and down into the inferior vena cava. Withdrawing approximately 12 cm should result in tip residing in the region of the superior cavoatrial junction. No pneumothorax. Mild patchy right base opacity and bilateral hilar adenopathy again evident. IMPRESSION: The new right jugular central line extends beyond the right heart chambers and on into the IVC. Recommend withdrawing for optimal placement, approximately 12-14 cm. These results will be called to the ordering clinician or representative by the Radiologist Assistant, and communication documented in the PACS or zVision Dashboard. Electronically Signed   By: Andreas Newport M.D.   On: 02/06/2016 23:25   Dg Chest Port 1 View  02/19/2016  CLINICAL DATA:  Increasing weakness and fatigue over the past week. Recent diagnosis of lung cancer. EXAM: PORTABLE CHEST 1 VIEW COMPARISON:  01/31/2016, 01/20/2016. FINDINGS: Basilar  opacities are significantly reduced compared to 01/20/2016, with mild residual patchy opacity in the medial right base. Bilateral hilar adenopathy again noted. Probable small left pleural effusion. Pulmonary vasculature is normal. IMPRESSION: Probable small left pleural effusion. Mild patchy opacity in the medial right base. Unchanged hilar adenopathy. Electronically Signed   By: Andreas Newport M.D.   On: 02/05/2016 19:27     Medications:  Scheduled: . antiseptic oral rinse  7 mL Mouth Rinse q12n4p  . aztreonam  2 g Intravenous Q8H  . chlorhexidine  15 mL Mouth Rinse BID  . dexamethasone  4 mg Intravenous Q12H  . fluconazole (DIFLUCAN) IV  100 mg Intravenous Q24H  . insulin  aspart  0-15 Units Subcutaneous TID WC  . insulin aspart  0-5 Units Subcutaneous QHS  . insulin glargine  10 Units Subcutaneous Daily  . metronidazole  500 mg Intravenous Q8H  . saccharomyces boulardii  250 mg Oral BID  . sodium chloride flush  3 mL Intravenous Q12H  . vancomycin  1,000 mg Intravenous Q8H   Continuous: . sodium chloride 50 mL/hr at 02/10/16 2240   [id number] **OR** acetaminophen, dextrose, guaiFENesin-dextromethorphan, HYDROcodone-acetaminophen, ipratropium, levalbuterol, ondansetron **OR** ondansetron (ZOFRAN) IV  Assessment/Plan:  Active Problems:   Breast cancer of upper-outer quadrant of right female breast (HCC)   Brain metastases (HCC)   Hyponatremia   Hyperglycemia   HCAP (healthcare-associated pneumonia)   SIRS (systemic inflammatory response syndrome) ([place])   History of pulmonary embolism   Sepsis ([place])    Acute hypoxic respiratory failure Patient became more hypoxic and dyspneic yesterday. Evaluation was negative for PE. CT angiogram could not be performed due to IV access issues. VQ scan was done which was low probability for PE. Her respiratory failure appears to be most likely secondary to an infectious process. Chest x-ray from earlier this morning once again shows  opacity in the right lung which is most likely pneumonia. Chest x-ray from yesterday evening suggested fluid overload. Patient was given Lasix overnight. Patient remains febrile, tachycardic and hypotensive. Patient was also started on BiPAP overnight. Discussed with Dr. Lamonte Sakai with critical care medicine. Critical care medicine to assume care this morning.  Sepsis/possible septic shock Patient developed fever overnight. She remains tachycardic and borderline hypotensive. She is now leukopenic. Possible source is pneumonia. Patient does have a cough and there was nonspecific finding on chest x-ray. Blood cultures are negative so far. Lactic acid was elevated yesterday and improved with fluid boluses. Continue broad-spectrum antibiotics for now. Follow-up on culture reports.   Pneumonia, likely healthcare associated Aspiration is another possibility. Continue broad-spectrum antibiotics as mentioned above. Follow-up on culture data. ProCalcitonin level was significantly elevated.  Stage IV breast cancer with brain metastases. Followed by oncology as outpatient. Plan is to initiate chemotherapy later this month. Port-A-Cath to be placed prior to that. Patient is receiving radiation treatment to her brain lesions. She is also on dexamethasone for the same, which will be continued. Her long-term prognosis is thought to be poor. Palliative care was offered, however, patient elected to go with chemotherapy.  Hyperglycemia. 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 Patient was initially started on intravenous insulin. Transitioned to long-acting insulin. HbA1c is pending.  Previous history of pulmonary embolism Patient completed course of anticoagulation sometime in 2016. Followed by oncology.  Pancytopenia Patient continues to be leukopenic. Worse today. Most likely due to sepsis. Her malignancy could also be contributing. Similar situation with platelet counts as well. No heparin for Rx.  No evidence for overt bleeding. Continue to monitor counts closely.   Hyponatremia Most likely due to hypovolemia. Plus patient was given a dose of Lasix overnight.   DVT Prophylaxis: SCDs    Code Status: Full code  Family Communication: Discussed with the patient and her sister Disposition Plan: Discussed with Dr. Lamonte Sakai this morning. Critical care medicine to assume care.     LOS: 2 days   [facility] Pager [phone] 03-07-16, 7:23 AM  If 7PM-7AM, please contact night-coverage at [email or web address], password [facility]

## 2016-03-07 NOTE — Progress Notes (Signed)
CRITICAL VALUE ALERT  Critical value received:  WBC 0.8  Date of notification:  2016-02-13  Time of notification:  0608  Critical value read back: Yes  Nurse who received alert:  Yehuda Budd, RN  MD notified (1st page): Chase Caller MD  Time of first page:  0609  Responding MD:  Chase Caller  Time MD responded:  [phone]

## 2016-03-07 NOTE — Progress Notes (Signed)
eLink Physician-Brief Progress Note Patient Name: Jamie Edwards DOB: 02-13-1971 MRN: [id number]   Date of Service  Mar 05, 2016  HPI/Events of Note  Goals of care discussion with sister, Jamie Edwards, who will speak with the patient's daughter and the rest of the family and get back to me.   eICU Interventions  Await results of family discussion.     Intervention Category Minor Interventions: Clinical assessment - ordering diagnostic tests;Communication with other healthcare providers and/or family  Lysle Dingwall 2016/03/05, 4:54 PM

## 2016-03-07 NOTE — Procedures (Signed)
Bronchoscopy Procedure Note Jamie Edwards [id number] 1971/08/17  Procedure: Bronchoscopy Indications: Diagnostic evaluation of the airways and Obtain specimens for culture and/or other diagnostic studies  Procedure Details Consent: Risks of procedure as well as the alternatives and risks of each were explained to the (patient/caregiver).  Consent for procedure obtained. Time Out: Verified patient identification, verified procedure, site/side was marked, verified correct patient position, special equipment/implants available, medications/allergies/relevent history reviewed, required imaging and test results available.  Performed  In preparation for procedure, patient was given 100% FiO2. Sedation: Etomidate  Airway entered and the following bronchi were examined: RUL, RML, RLL, LUL and LLL.   Procedures performed: RUL BAL performed for cx data  Evaluation Hemodynamic Status: BP stable throughout; O2 sats: stable throughout Patient's Current Condition: stable Specimens:  RUL BAL Complications: No apparent complications Patient did tolerate procedure well.   Jamie Apo, MD, PhD March 11, 2016, 11:38 AM [facility] [phone] or if no answer [phone]

## 2016-03-07 NOTE — Progress Notes (Signed)
Pt passed away at 1745.  Pt family daughter, fiance, and other extended family was at bedside. Dr. Harland Dingwall prouneced death at 50.  Asystole on monitor and no pulse heard.  Irven [place], RN

## 2016-03-07 NOTE — Progress Notes (Signed)
Pharmacy Antibiotic Note  Jamie Edwards is a 45 y.o. female admitted on 02/14/2016 with pneumonia.  PMH includes stage IV breast cancer with brain mets currently undergoing radiation.  Underwent radiation on 5/5 and experience increased weakness after treatment and was brought to ED.  Patient being admitted for sepsis with pneumonia being the suspected source.  Pharmacy has been consulted for Vancomycin & Aztreonam dosing.  MD has also ordered Flagyl.  Plan: Vancomycin 1gm IV every 8 hours.  Goal trough 15-20 mcg/mL.  Aztreonam 2gm IV q8h Flagyl per MD F/U cultures/sensitivities Check VT tonight  Height: 5\' 8"  (172.7 cm) Weight: 195 lb 12.3 oz (88.8 kg) IBW/kg (Calculated) : 63.9  Temp (24hrs), Avg:100.7 F (38.2 C), Min:99 F (37.2 C), Max:102.3 F (39.1 C)   Recent Labs Lab 02/05/16 1302 02/05/16 1302 02/29/2016 1903 03/04/2016 1906 03/01/2016 2035 02/10/16 0120 02/10/16 0500 02/10/16 1015 02/10/16 1200 Mar 01, 2016 0437 01-Mar-2016 0509  WBC 21.8*  --   --   --  6.7  --  3.7*  --   --  0.8*  --   CREATININE  --  1.0 0.81  --   --  0.80 0.78  --   --   --  0.98  LATICACIDVEN  --   --   --  3.85*  --  2.4* 4.2* 2.3* 2.5*  --   --     Estimated Creatinine Clearance: 86.4 mL/min (by C-G formula based on Cr of 0.98).    Allergies  Allergen Reactions  . Erythromycin Base     Other reaction(s): GI Upset (intolerance)  . Penicillins Other (See Comments)    Has patient had a PCN reaction causing immediate rash, facial/tongue/throat swelling, SOB or lightheadedness with hypotension:No Has patient had a PCN reaction causing severe rash involving mucus membranes or skin necrosis:No Has patient had a PCN reaction that required hospitalization:No Has patient had a PCN reaction occurring within the last 10 years:No If all of the above answers are "NO", then may proceed with Cephalosporin use. Stomach upset  . Zithromax [Azithromycin] Diarrhea    Antimicrobials this admission: PTA  fluconazole resumed - plan d/c 5/8 (thrush) 5/6 vanc >>   5/6 azactam >>   5/6 flagyl >>  Dose adjustments this admission:  5/7: VT  = _____ on 1g IV q8h  Microbiology results: 5/6 BCx:  NGTD 5/6 UCx:   collected 5/6 MRSA PCR: negative 5/7 BAL: sent 5/7 AFB: sent Thank you for allowing pharmacy to be a part of this patient's care.  Ralene Bathe, PharmD, BCPS 01-Mar-2016, 2:00 PM  Pager: [phone]

## 2016-03-07 NOTE — Progress Notes (Signed)
[facility] Progress Note Patient Name: Jamie Edwards DOB: 01-30-1971 MRN: [id number]   Date of Service  Feb 17, 2016  HPI/Events of Note  HR 130, sbp soft again. febrile  eICU Interventions  Tylenol  Repeat 1L bolus     Intervention Category Major Interventions: Other:  Aleyza Salmi 02/17/2016, 5:06 AM

## 2016-03-07 NOTE — Progress Notes (Addendum)
[place] paged to pt room shortly after pt coded. [place] remained w/pt's daughter, sister, fiance, and best friend during the code event. Pt's daughter's fiance was also present along w/friend from their church.  Pt's daughter was initially having a difficult time releasing her mother and after resolving to allow her to pass peacefully she embraced her decision and spent time with her. After pt passed, daughter spent time alone w/her. She was appropriately very emotional and tearful. Pt's sister needed more intensive emotional support as she told her story of how her (and pt's) mom passed when she was 32. She said pt raised her from that point.  [place] provided emotional, spiritual and pastoral support throughout the code, critical care, and passing of pt. Daughter said they had prayed and appreciated [place] also praying w/them.  Chaplain Ernest Haber, M.Div.   03-01-16 2000  Clinical Encounter Type  Visited With Family;Patient and family together

## 2016-03-07 NOTE — Progress Notes (Signed)
ANTICOAGULATION CONSULT NOTE - Initial Consult  Pharmacy Consult for IV Heparin Indication: Empiric treatment for PE  Allergies  Allergen Reactions  . Erythromycin Base     Other reaction(s): GI Upset (intolerance)  . Penicillins Other (See Comments)    Has patient had a PCN reaction causing immediate rash, facial/tongue/throat swelling, SOB or lightheadedness with hypotension:No Has patient had a PCN reaction causing severe rash involving mucus membranes or skin necrosis:No Has patient had a PCN reaction that required hospitalization:No Has patient had a PCN reaction occurring within the last 10 years:No If all of the above answers are "NO", then may proceed with Cephalosporin use. Stomach upset  . Zithromax [Azithromycin] Diarrhea    Patient Measurements: Height: 5\' 8"  (172.7 cm) Weight: 195 lb 12.3 oz (88.8 kg) IBW/kg (Calculated) : 63.9 Heparin Dosing Weight: 82.6kg  Vital Signs: BP: 101/57 mmHg (05/07 1200) Pulse Rate: 196 (05/07 1317)  Labs:  Recent Labs  02/25/2016 2035 02/10/16 0120 02/10/16 0500 Mar 06, 2016 0437 Mar 06, 2016 0509  HGB 14.7  --  11.8* 11.7*  --   HCT 41.1  --  34.1* 34.6*  --   PLT 154  --  99* 69*  --   APTT  --  <20*  --   --   --   LABPROT  --  19.0*  --   --   --   INR  --  1.66*  --   --   --   CREATININE  --  0.80 0.78  --  0.98    Estimated Creatinine Clearance: 86.4 mL/min (by C-G formula based on Cr of 0.98).   Medical History: Past Medical History  Diagnosis Date  . Increased heart rate 11/2014    pt. put on metoprolol  . Pulmonary embolism ([place]) 11/2014  . DVT (deep venous thrombosis) ([place]) 11/2014    BUE  . Cancer of right breast ([place]) 08/2014    S/P chemo 09/2014-02/02/2015  . Heart murmur     pt. states a doctor stated she had a slight murmur, no studies done. Present PCP has never mention it to her.  . SVT (supraventricular tachycardia) (HCC)     Assessment: 37 yoF with stage IV breast cancer with brain mets currently  undergoing whole brain radiation. Underwent radiation on 5/5, experienced increased weakness after treatment and was brought to ED.Found to be septic with possible new RLL infiltrate, and started on HCAP therapy.  Required intubation this afternoon however remains very hypoxic.  Pharmacy consulted to start empiric IV heparin for PE despite V/Q scan showing low probability.  Spoke with CCM who agreed with no bolus due to thrombocytopenia.  Will be very conservative with initial dosing.    Baseline aPTT low.  Plts decreasing, now 69K.  Hgb low, stable overnight.  SCr increasing.  No blood thinners noted PTA.   Goal of Therapy:  Heparin level 0.3-0.7 units/ml Monitor platelets by anticoagulation protocol: Yes   Plan:  Baseline PT/INR Start IV heparin at 1250 units/hr = 12.5 ml/hr Check heparin level in 6 hours Daily CBC, HL F/u closely for bleeding  Ralene Bathe, PharmD, BCPS 03/06/16, 4:27 PM  Pager: [phone]

## 2016-03-07 NOTE — Care Management Note (Signed)
Case Management Note  Patient Details  Name: [name] MRN: [id number] Date of Birth: [birth year]  Subjective/Objective:                 Admitted on [id number] with dka, arrested and expired on [id number].   Action/Plan:none   Expected Discharge Date:     [id number]             Expected Discharge Plan:  Expired  In-House Referral:     Discharge planning Services  CM Consult  Post Acute Care Choice:  NA Choice offered to:  NA  DME Arranged:    DME Agency:     HH Arranged:    HH Agency:     Status of Service:  Completed, signed off  Medicare Important Message Given:    Date Medicare IM Given:    Medicare IM give by:    Date Additional Medicare IM Given:    Additional Medicare Important Message give by:     If discussed at [place] of Stay Meetings, dates discussed:    Additional Comments:  Leeroy Cha, RN 02/26/2016, 11:28 AM

## 2016-03-07 NOTE — Progress Notes (Addendum)
eLink Physician-Brief Progress Note Patient Name: Jamie Edwards DOB: December 04, 1970 MRN: [id number]   Date of Service  12-Mar-2016  HPI/Events of Note  Hypoxia - Sat = 77% on P 12. Spoke with Dr. Lamonte Sakai who rounded on the patient today. We both agree that we are running out of good ideas to support her oxygenation as he has already bronched the patient earlier today and did not find significant airway obstruction by mucus plugging.   eICU Interventions  Will order: 1. L lateral decub position 2. Recruitment maneuver. 3. Increase PEEP to 14. 4. Heparin IV infusion per pharmacy consult. Empiric therapy. V/Q low probability. However, hypoxia is out of proportion to her degree of pulmonary infiltrate. 5. NGT to LIS.     Intervention Category Major Interventions: Acid-Base disturbance - evaluation and management;Hypoxemia - evaluation and management  Lysle Dingwall 03-12-2016, 3:46 PM

## 2016-03-07 NOTE — Procedures (Signed)
Arterial Catheter Insertion Procedure Note Jamie Edwards [id number] [birth year]  Procedure: Insertion of Arterial Catheter  Indications: Blood pressure monitoring  Procedure Details Consent: Risks of procedure as well as the alternatives and risks of each were explained to the (patient/caregiver).  Consent for procedure obtained. Time Out: Verified patient identification, verified procedure, site/side was marked, verified correct patient position, special equipment/implants available, medications/allergies/relevent history reviewed, required imaging and test results available.  Performed  Maximum sterile technique was used including antiseptics, cap, gloves, gown, hand hygiene, mask and sheet. Skin prep: Iodine solution; local anesthetic administered 20 gauge catheter was inserted into left radial artery using the Seldinger technique.  Evaluation Blood flow good; BP tracing good. Complications: No apparent complications.   Tamera Reason 02/29/16

## 2016-03-07 NOTE — Progress Notes (Signed)
Patient ID: [name], female   DOB: 1970/11/02, 45 y.o.   MRN: [id number]  Arrived and assessed chart, d/w Dr Lamonte Sakai and Madalyn Rob All films reviewed and chart, labs General: agonal on vent Neuro: agonal, unresposnive, pupils fixed HEENT: ett, jvd wnl PULM: ronchi rt, agonal CV:  s1 s2 rrt GI: soft, bs hypo, nt Extremities: edema  A/p 1. Septic shock, neutropenic 2. PNA rt, HCAP 3. MODS 4. R/o PE as oxygenation and ventilation deficits are out of proportion to pcxr 5. MEt breast CA including brain  pcn allergy noted, change to Imipenem stat Change diflucan to anidulofungin I have had extensive discussions with daughter We discussed patients current circumstances and organ failures. We also discussed patient's prior wishes under circumstances such as this. Family has decided to NOT perform resuscitation if arrest but to continue current medical support for now. Attempt Tv increase short term Fent, versed, paralysis indicated TPA contraindicated with brain mets Hep maintain Good lung down re attempt when able Recent bronch without obstruction, no repeat needed Peep to 16 Would prefer ventilation improvement first then O2 needs PCV would likely get more acidotic  Updated family in full  Update. Despite above, into wide complex, I then treated empiric hyperkalemia Did not respond, asystole Pronounced 545 pm  Ccm time 45 min   Jamie Edwards. Jamie Mould, MD, [facility]: [facility]

## 2016-03-07 NOTE — Progress Notes (Signed)
Patient had temp of 102.3 MD notified and Tylenol suppository given. Patient had significant breakdown in crevice of buttocks, are cleansed, dried, and protective barrier cream applied.

## 2016-03-07 DEATH — deceased

## 2016-03-26 LAB — ACID FAST CULTURE WITH REFLEXED SENSITIVITIES: ACID FAST CULTURE - AFSCU3: NEGATIVE

## 2016-03-27 ENCOUNTER — Other Ambulatory Visit: Payer: Self-pay | Admitting: Nurse Practitioner

## 2016-04-06 NOTE — Discharge Summary (Signed)
   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 Undergoing whole repeat palliative brain radiation therapy since 01/26/16. Also note plan for possible repeat chemo based on Oncology note from 5/1. She has had very poor PO intake, has been weak. Experienced oral thrush. Was seen by Dr Isidore Moos on 5/5 and was sent to the ED given her degree of fatigue and weakness. Pt also describes new dry cough. CXR 5/5 showed possible new RLL infiltrate, no real change in L pleural disease or known nodules. Started on HCAP abx coverage in setting neutropenia. She had an elevated lactic acid and ultimately evolved septic shock. She received aggressive IV fluid resuscitation with some hemodynamic improvement but involving bilateral pulmonary infiltrates consistent with an acute lung injury and superimposed volume overload. She was started on BiPAP but progressively worsened. She was intubated for mechanical ventilation 02/23/16. A bronchoscopy was performed that showed no evidence of obstruction or significant secretions. BAL was performed. Empiric antifungals were added and abx were broadened. Her shock worsened despite  pressor support and MV. Despite all aggressive interventions she remained in shock and could not be adequately ventilated or oxygenated.  She developed wide complex bradycardia around 1730 on 02/23/16. Empiric treatment for hyperkalemia did not improve her rhythm. She developed into asystole and expired around 1745    [name], MD, PhD 03/13/2016, 12:44 PM [facility] [phone] or if no answer [phone]

## 2017-05-08 IMAGING — CT NM PET TUM IMG INITIAL (PI) SKULL BASE T - THIGH
1 of 8 series · 1 of 25 positions shown · non-contrast
Comparison: 09/02/2014

CLINICAL DATA: Initial treatment strategy for breast cancer.

EXAM:
NUCLEAR MEDICINE PET SKULL BASE TO THIGH
TECHNIQUE: 10.2 mCi F-18 FDG was injected intravenously. Full-ring PET imaging
was performed from the skull base to thigh after the radiotracer. CT
data was obtained and used for attenuation correction and anatomic
localization.
FASTING BLOOD GLUCOSE:  Value: 225 mg/dl

[Series 4: ct sk_thigh 5.0 b31f · axial · 5.0mm · 0.98mm/px · 1 of 204 slices shown]
[im 204/204  brain]
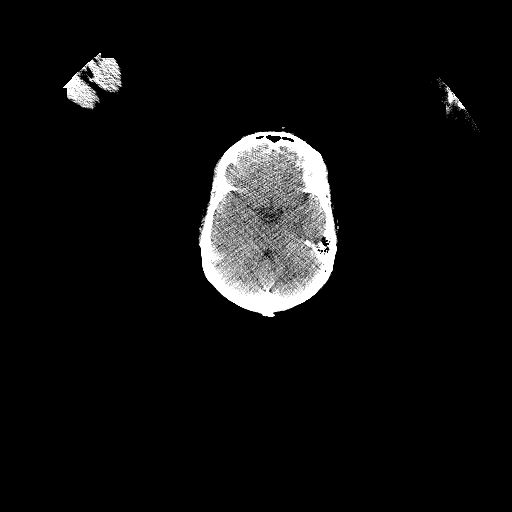

[1 of 25 positions shown; findings below may reference images not displayed]

FINDINGS: NECK

No hypermetabolic lymph nodes in the neck.

CHEST

There is a moderate left pleural effusion. Within the deep posterior
costophrenic sulcus there is a rind of hypermetabolic tumor which
measures 11 mm in thickness and exhibits intense FDG uptake within
SUV max equal to 10.0. A second focus of tumor along the anterior
costophrenic sulcus measures 5 cm in length, has a thickness of 1 cm
and has an SUV max equal to 10.6. Scattered small hypermetabolic
nodules are noted. Index nodule within the posterior right lower
lobe measures 1.2 cm and has an SUV max equal to 7.6. Anterior
medial left upper lobe mass measures 3.1 x 1.2 cm and has an SUV max
equal to 9.2.

Hypermetabolic bilateral hilar and mediastinal adenopathy is
identified. Right hilar lymph node measures 3.7 cm and has an SUV
max equal to 11.04. Pre-vascular lymph node mass measures 5.7 x
cm and has an SUV max equal to 8.9. Sub- carinal lymph node measures
9 mm and has an SUV max equal to 8.3.

ABDOMEN/PELVIS

Mass within segment 7 of the liver measures 2.9 cm and has an SUV
max equal to 10.6. No abnormal uptake within the pancreas, spleen,
or adrenal glands. No hypermetabolic adenopathy within the abdomen
or pelvis.

SKELETON

There is a hypermetabolic lesion within the right iliac bone. This
has an SUV max equal to 6.3. A faint lucent lesion on the
corresponding CT images measures 6 mm, image 114 of series 4.
IMPRESSION: 1. Evidence of hypermetabolic mediastinal and bilateral lymph node
metastasis.
2. Bilateral pulmonary metastasis as well as evidence of
hypermetabolic pleural based tumor within the anterior and posterior
inferior costophrenic sulci.
3. Hypermetabolic right lobe of liver metastasis
4. Hypermetabolic right iliac bone metastasis.

## 2017-05-17 IMAGING — DX DG CHEST 1V PORT
1 series · 1 of 1 positions shown · non-contrast
Comparison: 02/09/2016 at [DATE]

CLINICAL DATA: Central line placement

EXAM:
PORTABLE CHEST 1 VIEW

[chest ap]
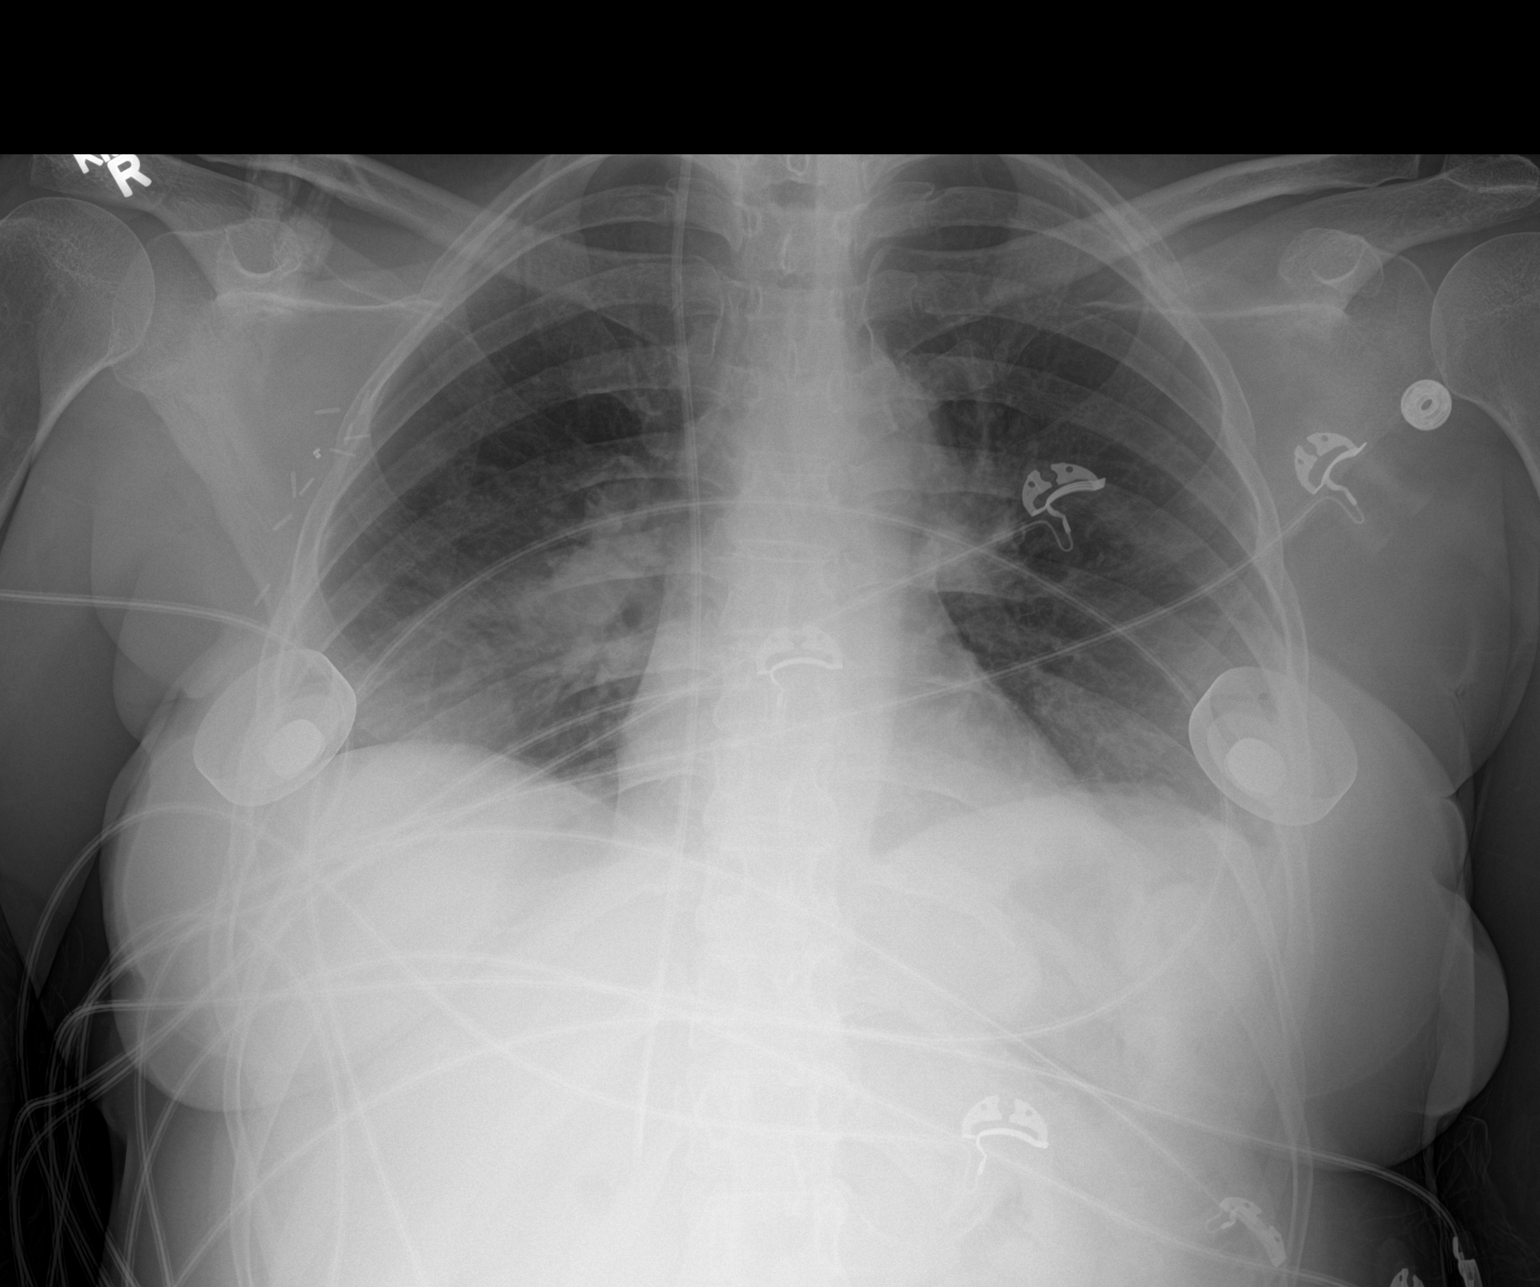

[1 of 1 positions shown; findings below may reference images not displayed]

FINDINGS: There is a new right jugular central line, extending through the
right heart chambers and down into the inferior vena cava.
Withdrawing approximately 12 cm should result in tip residing in the
region of the superior cavoatrial junction.

No pneumothorax. Mild patchy right base opacity and bilateral hilar
adenopathy again evident.
IMPRESSION: The new right jugular central line extends beyond the right heart
chambers and on into the IVC. Recommend withdrawing for optimal
placement, approximately 12-14 cm. These results will be called to
the ordering clinician or representative by the Radiologist
Assistant, and communication documented in the PACS or zVision
Dashboard.
